# Patient Record
Sex: Female | Born: 1960
Health system: Southern US, Community
[De-identification: ages and names within clinical notes are randomized; demographics above are authoritative.]

## PROBLEM LIST (undated history)

## (undated) DIAGNOSIS — M1990 Unspecified osteoarthritis, unspecified site: Secondary | ICD-10-CM

## (undated) DIAGNOSIS — K219 Gastro-esophageal reflux disease without esophagitis: Secondary | ICD-10-CM

## (undated) DIAGNOSIS — J45909 Unspecified asthma, uncomplicated: Secondary | ICD-10-CM

## (undated) DIAGNOSIS — F411 Generalized anxiety disorder: Secondary | ICD-10-CM

## (undated) DIAGNOSIS — K589 Irritable bowel syndrome without diarrhea: Secondary | ICD-10-CM

## (undated) DIAGNOSIS — R569 Unspecified convulsions: Secondary | ICD-10-CM

## (undated) DIAGNOSIS — E119 Type 2 diabetes mellitus without complications: Secondary | ICD-10-CM

## (undated) HISTORY — DX: Unspecified convulsions: R56.9

## (undated) HISTORY — DX: Generalized anxiety disorder: F41.1

## (undated) HISTORY — DX: Gastro-esophageal reflux disease without esophagitis: K21.9

## (undated) HISTORY — DX: Irritable bowel syndrome without diarrhea: K58.9

## (undated) HISTORY — DX: Unspecified asthma, uncomplicated: J45.909

## (undated) HISTORY — DX: Type 2 diabetes mellitus without complications: E11.9

## (undated) HISTORY — PX: ABDOMINAL HYSTERECTOMY: SHX81

## (undated) HISTORY — PX: TUBAL LIGATION: SHX77

## (undated) HISTORY — DX: Unspecified osteoarthritis, unspecified site: M19.90

---

## 1997-11-10 ENCOUNTER — Other Ambulatory Visit: Admission: RE | Admit: 1997-11-10 | Discharge: 1997-11-10 | Payer: Self-pay | Admitting: Gynecology

## 1999-02-21 ENCOUNTER — Other Ambulatory Visit: Admission: RE | Admit: 1999-02-21 | Discharge: 1999-02-21 | Payer: Self-pay | Admitting: Gynecology

## 2000-02-27 ENCOUNTER — Other Ambulatory Visit: Admission: RE | Admit: 2000-02-27 | Discharge: 2000-02-27 | Payer: Self-pay | Admitting: Gynecology

## 2000-09-18 ENCOUNTER — Encounter: Payer: Self-pay | Admitting: Emergency Medicine

## 2000-09-18 ENCOUNTER — Emergency Department (HOSPITAL_COMMUNITY): Admission: EM | Admit: 2000-09-18 | Discharge: 2000-09-18 | Payer: Self-pay | Admitting: Emergency Medicine

## 2001-03-13 ENCOUNTER — Other Ambulatory Visit: Admission: RE | Admit: 2001-03-13 | Discharge: 2001-03-13 | Payer: Self-pay | Admitting: Gynecology

## 2001-12-17 ENCOUNTER — Encounter: Payer: Self-pay | Admitting: Gynecology

## 2001-12-18 ENCOUNTER — Encounter: Payer: Self-pay | Admitting: Gynecology

## 2001-12-24 ENCOUNTER — Inpatient Hospital Stay (HOSPITAL_COMMUNITY): Admission: RE | Admit: 2001-12-24 | Discharge: 2001-12-25 | Payer: Self-pay | Admitting: Gynecology

## 2001-12-24 ENCOUNTER — Encounter (INDEPENDENT_AMBULATORY_CARE_PROVIDER_SITE_OTHER): Payer: Self-pay | Admitting: Specialist

## 2004-11-07 ENCOUNTER — Ambulatory Visit: Payer: Self-pay | Admitting: Internal Medicine

## 2005-03-08 ENCOUNTER — Ambulatory Visit: Payer: Self-pay | Admitting: Internal Medicine

## 2005-05-07 ENCOUNTER — Ambulatory Visit: Payer: Self-pay | Admitting: Internal Medicine

## 2005-06-06 ENCOUNTER — Ambulatory Visit: Payer: Self-pay | Admitting: Family Medicine

## 2005-06-20 ENCOUNTER — Other Ambulatory Visit: Admission: RE | Admit: 2005-06-20 | Discharge: 2005-06-20 | Payer: Self-pay | Admitting: Gynecology

## 2005-07-05 ENCOUNTER — Encounter: Admission: RE | Admit: 2005-07-05 | Discharge: 2005-07-05 | Payer: Self-pay | Admitting: Gynecology

## 2005-12-03 ENCOUNTER — Emergency Department (HOSPITAL_COMMUNITY): Admission: EM | Admit: 2005-12-03 | Discharge: 2005-12-03 | Payer: Self-pay | Admitting: Emergency Medicine

## 2005-12-20 ENCOUNTER — Ambulatory Visit: Payer: Self-pay | Admitting: Internal Medicine

## 2007-02-03 ENCOUNTER — Emergency Department (HOSPITAL_COMMUNITY): Admission: EM | Admit: 2007-02-03 | Discharge: 2007-02-03 | Payer: Self-pay | Admitting: Family Medicine

## 2007-03-17 ENCOUNTER — Telehealth: Payer: Self-pay | Admitting: *Deleted

## 2007-03-17 DIAGNOSIS — M545 Low back pain, unspecified: Secondary | ICD-10-CM | POA: Insufficient documentation

## 2007-03-17 DIAGNOSIS — F411 Generalized anxiety disorder: Secondary | ICD-10-CM | POA: Insufficient documentation

## 2007-03-17 DIAGNOSIS — R51 Headache: Secondary | ICD-10-CM | POA: Insufficient documentation

## 2007-03-17 DIAGNOSIS — R519 Headache, unspecified: Secondary | ICD-10-CM | POA: Insufficient documentation

## 2007-03-17 HISTORY — DX: Generalized anxiety disorder: F41.1

## 2007-05-07 ENCOUNTER — Ambulatory Visit: Payer: Self-pay | Admitting: Internal Medicine

## 2007-05-07 DIAGNOSIS — J45901 Unspecified asthma with (acute) exacerbation: Secondary | ICD-10-CM | POA: Insufficient documentation

## 2007-06-16 ENCOUNTER — Telehealth (INDEPENDENT_AMBULATORY_CARE_PROVIDER_SITE_OTHER): Payer: Self-pay | Admitting: *Deleted

## 2007-06-25 ENCOUNTER — Telehealth: Payer: Self-pay | Admitting: *Deleted

## 2007-06-26 ENCOUNTER — Ambulatory Visit: Payer: Self-pay | Admitting: Critical Care Medicine

## 2007-06-26 DIAGNOSIS — J45909 Unspecified asthma, uncomplicated: Secondary | ICD-10-CM | POA: Insufficient documentation

## 2007-06-26 DIAGNOSIS — K219 Gastro-esophageal reflux disease without esophagitis: Secondary | ICD-10-CM

## 2007-06-26 HISTORY — DX: Unspecified asthma, uncomplicated: J45.909

## 2007-06-26 HISTORY — DX: Gastro-esophageal reflux disease without esophagitis: K21.9

## 2007-06-26 LAB — CONVERTED CEMR LAB: IgE (Immunoglobulin E), Serum: 77.9 intl units/mL (ref 0.0–180.0)

## 2007-06-30 ENCOUNTER — Telehealth (INDEPENDENT_AMBULATORY_CARE_PROVIDER_SITE_OTHER): Payer: Self-pay | Admitting: *Deleted

## 2007-07-02 ENCOUNTER — Encounter: Payer: Self-pay | Admitting: Critical Care Medicine

## 2007-07-02 ENCOUNTER — Ambulatory Visit: Payer: Self-pay | Admitting: Critical Care Medicine

## 2007-07-02 ENCOUNTER — Telehealth: Payer: Self-pay | Admitting: Critical Care Medicine

## 2007-08-08 ENCOUNTER — Encounter: Admission: RE | Admit: 2007-08-08 | Discharge: 2007-08-08 | Payer: Self-pay | Admitting: Gynecology

## 2007-08-15 ENCOUNTER — Ambulatory Visit: Payer: Self-pay | Admitting: Internal Medicine

## 2007-08-15 LAB — CONVERTED CEMR LAB
Albumin: 3.6 g/dL (ref 3.5–5.2)
Alkaline Phosphatase: 55 units/L (ref 39–117)
BUN: 3 mg/dL — ABNORMAL LOW (ref 6–23)
Basophils Relative: 0.9 % (ref 0.0–1.0)
Calcium: 9.4 mg/dL (ref 8.4–10.5)
Creatinine, Ser: 0.9 mg/dL (ref 0.4–1.2)
Direct LDL: 149.4 mg/dL
Eosinophils Relative: 3.6 % (ref 0.0–5.0)
GFR calc Af Amer: 87 mL/min
Glucose, Bld: 105 mg/dL — ABNORMAL HIGH (ref 70–99)
HCT: 37.9 % (ref 36.0–46.0)
Hemoglobin: 12.4 g/dL (ref 12.0–15.0)
MCV: 97.3 fL (ref 78.0–100.0)
Monocytes Absolute: 0.8 10*3/uL (ref 0.1–1.0)
Monocytes Relative: 10.2 % (ref 3.0–12.0)
Neutro Abs: 3.6 10*3/uL (ref 1.4–7.7)
Nitrite: NEGATIVE
Potassium: 4.4 meq/L (ref 3.5–5.1)
RBC: 3.9 M/uL (ref 3.87–5.11)
Specific Gravity, Urine: 1.025
Total CHOL/HDL Ratio: 5.7
Total Protein: 6.3 g/dL (ref 6.0–8.3)
Urobilinogen, UA: 0.2
WBC: 7.4 10*3/uL (ref 4.5–10.5)

## 2007-09-02 ENCOUNTER — Ambulatory Visit: Payer: Self-pay | Admitting: Internal Medicine

## 2007-09-18 ENCOUNTER — Telehealth: Payer: Self-pay | Admitting: *Deleted

## 2007-09-18 ENCOUNTER — Ambulatory Visit: Payer: Self-pay | Admitting: Internal Medicine

## 2007-09-18 LAB — CONVERTED CEMR LAB: OCCULT 1: NEGATIVE

## 2007-12-02 ENCOUNTER — Ambulatory Visit: Payer: Self-pay | Admitting: Internal Medicine

## 2008-07-06 ENCOUNTER — Telehealth: Payer: Self-pay | Admitting: Internal Medicine

## 2008-07-08 ENCOUNTER — Encounter: Payer: Self-pay | Admitting: Internal Medicine

## 2008-07-23 ENCOUNTER — Telehealth: Payer: Self-pay | Admitting: Internal Medicine

## 2009-02-17 LAB — CONVERTED CEMR LAB: Pap Smear: NORMAL

## 2009-03-30 ENCOUNTER — Ambulatory Visit: Payer: Self-pay | Admitting: Internal Medicine

## 2009-03-30 LAB — CONVERTED CEMR LAB
ALT: 18 units/L (ref 0–35)
Alkaline Phosphatase: 57 units/L (ref 39–117)
Basophils Relative: 0.3 % (ref 0.0–3.0)
Bilirubin Urine: NEGATIVE
Bilirubin, Direct: 0 mg/dL (ref 0.0–0.3)
Calcium: 9.2 mg/dL (ref 8.4–10.5)
Cholesterol: 218 mg/dL — ABNORMAL HIGH (ref 0–200)
Creatinine, Ser: 0.6 mg/dL (ref 0.4–1.2)
Eosinophils Relative: 1.5 % (ref 0.0–5.0)
Ketones, urine, test strip: NEGATIVE
Lymphocytes Relative: 38.2 % (ref 12.0–46.0)
Neutrophils Relative %: 52.1 % (ref 43.0–77.0)
RBC: 4.04 M/uL (ref 3.87–5.11)
Total CHOL/HDL Ratio: 6
Total Protein: 7.5 g/dL (ref 6.0–8.3)
Triglycerides: 110 mg/dL (ref 0.0–149.0)
Urobilinogen, UA: 0.2
VLDL: 22 mg/dL (ref 0.0–40.0)
WBC: 7.7 10*3/uL (ref 4.5–10.5)

## 2009-04-06 ENCOUNTER — Ambulatory Visit: Payer: Self-pay | Admitting: Internal Medicine

## 2009-04-06 DIAGNOSIS — E119 Type 2 diabetes mellitus without complications: Secondary | ICD-10-CM

## 2009-04-06 HISTORY — DX: Type 2 diabetes mellitus without complications: E11.9

## 2009-04-06 LAB — CONVERTED CEMR LAB
BUN: 4 mg/dL — ABNORMAL LOW (ref 6–23)
CO2: 27 meq/L (ref 19–32)
Chloride: 105 meq/L (ref 96–112)
Creatinine, Ser: 0.6 mg/dL (ref 0.4–1.2)

## 2009-04-08 ENCOUNTER — Telehealth: Payer: Self-pay | Admitting: Internal Medicine

## 2009-05-26 ENCOUNTER — Ambulatory Visit: Payer: Self-pay | Admitting: Internal Medicine

## 2009-05-26 LAB — CONVERTED CEMR LAB: Hgb A1c MFr Bld: 6.3 % (ref 4.6–6.5)

## 2009-06-03 ENCOUNTER — Ambulatory Visit: Payer: Self-pay | Admitting: Internal Medicine

## 2009-09-07 ENCOUNTER — Telehealth: Payer: Self-pay | Admitting: Internal Medicine

## 2009-09-15 ENCOUNTER — Ambulatory Visit: Payer: Self-pay | Admitting: Internal Medicine

## 2009-09-15 DIAGNOSIS — R42 Dizziness and giddiness: Secondary | ICD-10-CM | POA: Insufficient documentation

## 2009-09-15 DIAGNOSIS — R109 Unspecified abdominal pain: Secondary | ICD-10-CM | POA: Insufficient documentation

## 2009-09-15 LAB — CONVERTED CEMR LAB
Basophils Absolute: 0.1 10*3/uL (ref 0.0–0.1)
Basophils Relative: 0.8 % (ref 0.0–3.0)
Eosinophils Relative: 2.5 % (ref 0.0–5.0)
HCT: 37.9 % (ref 36.0–46.0)
Hemoglobin: 12.5 g/dL (ref 12.0–15.0)
Lymphs Abs: 3.8 10*3/uL (ref 0.7–4.0)
Monocytes Relative: 6.5 % (ref 3.0–12.0)
Neutro Abs: 5.3 10*3/uL (ref 1.4–7.7)
RBC: 3.93 M/uL (ref 3.87–5.11)
RDW: 12.8 % (ref 11.5–14.6)

## 2009-09-19 ENCOUNTER — Telehealth: Payer: Self-pay | Admitting: Internal Medicine

## 2009-11-25 ENCOUNTER — Ambulatory Visit: Payer: Self-pay | Admitting: Internal Medicine

## 2009-12-13 ENCOUNTER — Ambulatory Visit: Payer: Self-pay | Admitting: Internal Medicine

## 2009-12-13 DIAGNOSIS — M1990 Unspecified osteoarthritis, unspecified site: Secondary | ICD-10-CM

## 2009-12-13 DIAGNOSIS — M130 Polyarthritis, unspecified: Secondary | ICD-10-CM | POA: Insufficient documentation

## 2009-12-13 HISTORY — DX: Unspecified osteoarthritis, unspecified site: M19.90

## 2010-01-26 ENCOUNTER — Ambulatory Visit: Payer: Self-pay | Admitting: Family Medicine

## 2010-01-27 LAB — CONVERTED CEMR LAB
Basophils Relative: 0.4 % (ref 0.0–3.0)
Eosinophils Absolute: 0.2 10*3/uL (ref 0.0–0.7)
HCT: 37.9 % (ref 36.0–46.0)
Hemoglobin: 12.8 g/dL (ref 12.0–15.0)
Lymphs Abs: 3.6 10*3/uL (ref 0.7–4.0)
MCHC: 33.8 g/dL (ref 30.0–36.0)
MCV: 95.5 fL (ref 78.0–100.0)
Monocytes Absolute: 0.9 10*3/uL (ref 0.1–1.0)
Neutro Abs: 4.2 10*3/uL (ref 1.4–7.7)
RBC: 3.97 M/uL (ref 3.87–5.11)

## 2010-02-27 ENCOUNTER — Ambulatory Visit: Payer: Self-pay | Admitting: Internal Medicine

## 2010-02-27 DIAGNOSIS — K5909 Other constipation: Secondary | ICD-10-CM | POA: Insufficient documentation

## 2010-02-27 DIAGNOSIS — K589 Irritable bowel syndrome without diarrhea: Secondary | ICD-10-CM

## 2010-02-27 HISTORY — DX: Irritable bowel syndrome, unspecified: K58.9

## 2010-02-28 ENCOUNTER — Encounter: Payer: Self-pay | Admitting: Internal Medicine

## 2010-04-10 ENCOUNTER — Ambulatory Visit: Payer: Self-pay | Admitting: Internal Medicine

## 2010-04-17 ENCOUNTER — Telehealth: Payer: Self-pay | Admitting: Internal Medicine

## 2010-04-17 DIAGNOSIS — R569 Unspecified convulsions: Secondary | ICD-10-CM | POA: Insufficient documentation

## 2010-04-17 HISTORY — DX: Unspecified convulsions: R56.9

## 2010-04-19 ENCOUNTER — Encounter: Payer: Self-pay | Admitting: Internal Medicine

## 2010-06-05 ENCOUNTER — Ambulatory Visit
Admission: RE | Admit: 2010-06-05 | Discharge: 2010-06-05 | Payer: Self-pay | Source: Home / Self Care | Attending: Internal Medicine | Admitting: Internal Medicine

## 2010-06-05 ENCOUNTER — Encounter: Payer: Self-pay | Admitting: *Deleted

## 2010-06-05 ENCOUNTER — Other Ambulatory Visit: Payer: Self-pay | Admitting: Internal Medicine

## 2010-06-05 NOTE — Progress Notes (Signed)
Subjective:     Patient ID: Denise Beard is a 50 y.o. female.  HPI Comments: IBS is GI issue  Diabetes She presents for her follow-up diabetic visit. She has type 2 diabetes mellitus. No MedicAlert identification noted. The initial diagnosis of diabetes was made 1 year ago. Her disease course has been stable. Hypoglycemia symptoms include headaches, hunger, sweats and tremors. Associated symptoms include blurred vision. Pertinent negatives for diabetes include no chest pain, no fatigue, no foot paresthesias, no foot ulcerations, no polydipsia, no polyuria and no weight loss. There are no hypoglycemic complications. Symptoms are stable. There are no diabetic complications. Risk factors for coronary artery disease include diabetes mellitus and tobacco exposure. Current diabetic treatment includes oral agent (monotherapy). She is compliant with treatment most of the time. Her weight is stable. She is following a generally healthy diet. Meal planning includes avoidance of concentrated sweets. She has not had a previous visit with a dietician. She rarely participates in exercise. There is no change in her home blood glucose trend. An ACE inhibitor/angiotensin II receptor blocker is not being taken. She does not see a podiatrist.Eye exam is not current.  Asthma She complains of hoarse voice. This is a recurrent problem. Associated symptoms include headaches and sweats. Pertinent negatives include no chest pain or weight loss. Her symptoms are aggravated by change in weather. Her symptoms are alleviated by beta-agonist and steroid inhaler. She reports significant improvement on treatment. Risk factors for lung disease include no known risk factors. Her past medical history is significant for asthma.  GI Problem The primary symptoms include abdominal pain and diarrhea. Primary symptoms do not include weight loss or fatigue. The problem has not changed since onset. The abdominal pain is located in the LUQ. The  severity of the abdominal pain is 5/10.      Review of Systems  Constitutional: Negative.  Negative for weight loss and fatigue.  HENT: Positive for hoarse voice.   Eyes: Positive for blurred vision.  Respiratory: Negative.   Cardiovascular: Negative.  Negative for chest pain.  Gastrointestinal: Positive for abdominal pain and diarrhea.  Genitourinary: Negative.  Negative for polyuria.  Neurological: Positive for tremors and headaches.  Hematological: Negative for polydipsia.       Objective:   Physical Exam  Constitutional: She is oriented to person, place, and time. She appears well-developed and well-nourished.  HENT:  Head: Normocephalic and atraumatic.  Eyes: Pupils are equal, round, and reactive to light.  Neck: No thyromegaly present.  Abdominal: There is tenderness in the left lower quadrant. There is no rebound.         Tender in the left lower quadrate without rebound  Neurological: She is alert and oriented to person, place, and time.  Skin: Skin is warm and dry.       Assessment:     The pt has stable DM and Asthma and we will order an A1C and Bmet  I have given the pt a glucometer and taught the use of the meter to measure randomblood glucoses and return next visit with meter and reading. I have discussed referral to dietician and a referral has been made The Pt IBS is still poorly controlled and diet is the main factor with need for increased fiber and less carbs.

## 2010-06-06 LAB — BASIC METABOLIC PANEL
BUN: 9 mg/dL (ref 6–23)
CO2: 29 mEq/L (ref 19–32)
Calcium: 10 mg/dL (ref 8.4–10.5)
Chloride: 105 mEq/L (ref 96–112)
Creatinine, Ser: 0.7 mg/dL (ref 0.4–1.2)
GFR: 125.1 mL/min (ref >60.00)
Glucose, Bld: 110 mg/dL — ABNORMAL HIGH (ref 70–99)
Potassium: 5.1 mEq/L (ref 3.5–5.1)
Sodium: 141 mEq/L (ref 135–145)

## 2010-06-06 LAB — HEMOGLOBIN A1C: Hgb A1c MFr Bld: 6.2 % (ref 4.6–6.5)

## 2010-06-22 NOTE — Medication Information (Signed)
Summary: Amitiza Capsule Approved  Amitiza Capsule Approved   Imported By: Maryln Gottron 03/02/2010 15:24:54  _____________________________________________________________________  External Attachment:    Type:   Image     Comment:   External Document

## 2010-06-22 NOTE — Assessment & Plan Note (Signed)
Summary: 6-7 WK ROV//SLM   Vital Signs:  Patient profile:   50 year old female Height:      66 inches Weight:      163 pounds BMI:     26.40 Temp:     98.2 degrees F oral Pulse rate:   72 / minute Resp:     14 per minute BP sitting:   110 / 70  (left arm)  Vitals Entered By: Willy Eddy, LPN (April 10, 2010 4:39 PM) CC: roa- Is Patient Diabetic? Yes   Primary Care Provider:  Stacie Glaze MD  CC:  roa-.  History of Present Illness: the patient  takes the amitiza in the evening.. when she took in the am she had loose stools in the evening she still has the increased gas and the full feeling in the colon... she feels the need to push she is on the 8 mg at this point DM is controlled with CBG's in range  Preventive Screening-Counseling & Management  Alcohol-Tobacco     Smoking Status: current     Packs/Day: 0.25     Year Started: 1984     Year Quit: 2007     Tobacco Counseling: to quit use of tobacco products  Problems Prior to Update: 1)  Seizure Disorder  (ICD-780.39) 2)  Irritable Bowel Syndrome  (ICD-564.1) 3)  Constipation, Chronic  (ICD-564.09) 4)  Unspec Polyarthropathy/polyarthrit Mx Sites  (ICD-716.59) 5)  Flank Pain, Left  (ICD-789.09) 6)  Postural Lightheadedness  (ICD-780.4) 7)  Diab w/o Comp Type Ii/uns Not Stated Uncntrl  (ICD-250.00) 8)  Routine General Medical Exam@health  Care Facl  (ICD-V70.0) 9)  Gerd  (ICD-530.81) 10)  Asthma  (ICD-493.90) 11)  Asthma Unspecified With Exacerbation  (ICD-493.92) 12)  Family History Depression  (ICD-V17.0) 13)  Low Back Pain  (ICD-724.2) 14)  Headache  (ICD-784.0) 15)  Anxiety  (ICD-300.00)  Current Problems (verified): 1)  Irritable Bowel Syndrome  (ICD-564.1) 2)  Constipation, Chronic  (ICD-564.09) 3)  Unspec Polyarthropathy/polyarthrit Mx Sites  (ICD-716.59) 4)  Flank Pain, Left  (ICD-789.09) 5)  Postural Lightheadedness  (ICD-780.4) 6)  Diab w/o Comp Type Ii/uns Not Stated Uncntrl   (ICD-250.00) 7)  Routine General Medical Exam@health  Care Facl  (ICD-V70.0) 8)  Gerd  (ICD-530.81) 9)  Asthma  (ICD-493.90) 10)  Asthma Unspecified With Exacerbation  (ICD-493.92) 11)  Family History Depression  (ICD-V17.0) 12)  Low Back Pain  (ICD-724.2) 13)  Headache  (ICD-784.0) 14)  Anxiety  (ICD-300.00)  Medications Prior to Update: 1)  Symbicort 80-4.5 Mcg/act Aero (Budesonide-Formoterol Fumarate) .... One Puff Two Times A Day 2)  Ambien 10 Mg  Tabs (Zolpidem Tartrate) .... At Bedtime As Needed 3)  Omeprazole 20 Mg  Cpdr (Omeprazole) .... By Mouth Daily. Take One Half Hour Before Eating. As Needed 4)  Januvia 100 Mg Tabs (Sitagliptin Phosphate) .... One By Mouth Daily 5)  Fexofenadine Hcl 180 Mg Tabs (Fexofenadine Hcl) .... One By Mouth Daily 6)  Vimovo 500-20 Mg Tbec (Naproxen-Esomeprazole) .... One By Mouth Bid 7)  Amitiza 8 Mcg Caps (Lubiprostone) .... One By  Two Times A Day  Current Medications (verified): 1)  Symbicort 80-4.5 Mcg/act Aero (Budesonide-Formoterol Fumarate) .... One Puff Two Times A Day 2)  Ambien 10 Mg  Tabs (Zolpidem Tartrate) .... At Bedtime As Needed 3)  Omeprazole 20 Mg  Cpdr (Omeprazole) .... By Mouth Daily. Take One Half Hour Before Eating. As Needed 4)  Januvia 100 Mg Tabs (Sitagliptin Phosphate) .... One By Mouth Daily  5)  Fexofenadine Hcl 180 Mg Tabs (Fexofenadine Hcl) .... One By Mouth Daily 6)  Vimovo 500-20 Mg Tbec (Naproxen-Esomeprazole) .... One By Mouth Bid 7)  Amitiza 8 Mcg Caps (Lubiprostone) .... One By  Two Times A Day  Allergies (verified): 1)  ! Iodine  Past History:  Family History: Last updated: 09/02/2007 Family History Depression Colon Cancer  hx of colon cancer in uncle MI/Heart Attack  Social History: Last updated: 03/17/2007 Occupation: Single Current Smoker  Risk Factors: Smoking Status: current (04/10/2010) Packs/Day: 0.25 (04/10/2010)  Past medical, surgical, family and social histories (including risk factors)  reviewed, and no changes noted (except as noted below).  Past Medical History: Reviewed history from 06/26/2007 and no changes required. Iron Defiency Insomnia Anxiety Headache Low back pain Asthma  Past Surgical History: Reviewed history from 03/17/2007 and no changes required. Denies surgical history  Family History: Reviewed history from 09/02/2007 and no changes required. Family History Depression Colon Cancer  hx of colon cancer in uncle MI/Heart Attack  Social History: Reviewed history from 03/17/2007 and no changes required. Occupation: Single Current Smoker  Review of Systems  The patient denies anorexia, fever, weight loss, weight gain, vision loss, decreased hearing, hoarseness, chest pain, syncope, dyspnea on exertion, peripheral edema, prolonged cough, headaches, hemoptysis, abdominal pain, melena, hematochezia, severe indigestion/heartburn, hematuria, incontinence, genital sores, muscle weakness, suspicious skin lesions, transient blindness, difficulty walking, depression, unusual weight change, abnormal bleeding, enlarged lymph nodes, angioedema, and breast masses.    Physical Exam  General:  Well-developed,well-nourished,in no acute distress; alert,appropriate and cooperative throughout examination Head:  Normocephalic and atraumatic without obvious abnormalities. No apparent alopecia or balding. Eyes:  pupils equal and pupils round.   Ears:  R ear normal and L ear normal.   Nose:  no external deformity and no nasal discharge.   Neck:  No deformities, masses, or tenderness noted. Lungs:  Normal respiratory effort, chest expands symmetrically. Lungs are clear to auscultation, no crackles or wheezes. Heart:  normal rate and regular rhythm.   Abdomen:  bowel sounds hypoactive and epigastric tenderness.   Msk:  normal ROM and no joint swelling.   Neurologic:  alert & oriented X3 and gait normal.   Skin:  Intact without suspicious lesions or rashes Psych:   Cognition and judgment appear intact. Alert and cooperative with normal attention span and concentration. No apparent delusions, illusions, hallucinations   Impression & Recommendations:  Problem # 1:  IRRITABLE BOWEL SYNDROME (ICD-564.1)  Problem # 2:  CONSTIPATION, CHRONIC (ICD-564.09)  Discussed dietary fiber measures and increased water intake.   Problem # 3:  DIAB W/O COMP TYPE II/UNS NOT STATED UNCNTRL (ICD-250.00)  Her updated medication list for this problem includes:    Januvia 100 Mg Tabs (Sitagliptin phosphate) ..... One by mouth daily  Labs Reviewed: Creat: 0.6 (04/06/2009)    Reviewed HgBA1c results: 5.8 (11/25/2009)  6.3 (05/26/2009)  Complete Medication List: 1)  Symbicort 80-4.5 Mcg/act Aero (Budesonide-formoterol fumarate) .... One puff two times a day 2)  Ambien 10 Mg Tabs (Zolpidem tartrate) .... At bedtime as needed 3)  Omeprazole 20 Mg Cpdr (Omeprazole) .... By mouth daily. take one half hour before eating. as needed 4)  Januvia 100 Mg Tabs (Sitagliptin phosphate) .... One by mouth daily 5)  Fexofenadine Hcl 180 Mg Tabs (Fexofenadine hcl) .... One by mouth daily 6)  Vimovo 500-20 Mg Tbec (Naproxen-esomeprazole) .... One by mouth bid 7)  Amitiza 8 Mcg Caps (Lubiprostone) .... One by  two times a day  Patient  Instructions: 1)  increase the dose to two 8 mg amitiza at bed time 2)  Please schedule a follow-up appointment in 6 weeks. Prescriptions: OMEPRAZOLE 20 MG  CPDR (OMEPRAZOLE) By mouth daily. Take one half hour before eating. as needed  #30 x 6   Entered by:   Willy Eddy, LPN   Authorized by:   Stacie Glaze MD   Signed by:   Willy Eddy, LPN on 56/21/3086   Method used:   Print then Give to Patient   RxID:   [id number] AMBIEN 10 MG  TABS (ZOLPIDEM TARTRATE) at bedtime as needed  #30 x 5   Entered by:   Willy Eddy, LPN   Authorized by:   Stacie Glaze MD   Signed by:   Willy Eddy, LPN on 44/05/270   Method used:    Print then Give to Patient   RxID:   [id number]    Orders Added: 1)  Est. Patient Level IV [38756]

## 2010-06-22 NOTE — Progress Notes (Signed)
  Phone Note Call from Patient   Summary of Call: request labs results- pt told all wnl  Initial call taken by: Willy Eddy, LPN,  Sep 20, 3014 11:15 AM

## 2010-06-22 NOTE — Assessment & Plan Note (Signed)
Summary: dizzy eopisodes/bmw   Vital Signs:  Patient profile:   50 year old female Height:      66 inches Weight:      170 pounds BMI:     27.54 Temp:     162 degrees F oral Pulse rate:   72 / minute Resp:     14 per minute BP sitting:   106 / 70  (left arm)  Vitals Entered By: Willy Eddy, LPN (September 15, 2009 3:33 PM) CC: c/o dizzy epoisodes and dull ache in left back- states she has the same feeling she had when Dr. Lovell Sheehan told her shwas predisposed to dm   CC:  c/o dizzy epoisodes and dull ache in left back- states she has the same feeling she had when Dr. Lovell Sheehan told her shwas predisposed to dm.  History of Present Illness: several weeks ago had dizzyness and noted quesdtional incontinance and now has intermitnat dizzyness that occurs for a day at a time and the symptoms have been occuring all day today she is concerned about infection the dizzyness is possitional of the head with laterla motion being perdominant  Preventive Screening-Counseling & Management  Alcohol-Tobacco     Smoking Status: current     Packs/Day: 0.25     Year Started: 1984     Year Quit: 2007  Problems Prior to Update: 1)  Diab w/o Comp Type Ii/uns Not Stated Uncntrl  (ICD-250.00) 2)  Acute Maxillary Sinusitis  (ICD-461.0) 3)  Routine General Medical Exam@health  Care Facl  (ICD-V70.0) 4)  Gerd  (ICD-530.81) 5)  Asthma  (ICD-493.90) 6)  Asthma Unspecified With Exacerbation  (ICD-493.92) 7)  Family History Depression  (ICD-V17.0) 8)  Low Back Pain  (ICD-724.2) 9)  Headache  (ICD-784.0) 10)  Anxiety  (ICD-300.00)  Current Problems (verified): 1)  Diab w/o Comp Type Ii/uns Not Stated Uncntrl  (ICD-250.00) 2)  Acute Maxillary Sinusitis  (ICD-461.0) 3)  Routine General Medical Exam@health  Care Facl  (ICD-V70.0) 4)  Gerd  (ICD-530.81) 5)  Asthma  (ICD-493.90) 6)  Asthma Unspecified With Exacerbation  (ICD-493.92) 7)  Family History Depression  (ICD-V17.0) 8)  Low Back Pain   (ICD-724.2) 9)  Headache  (ICD-784.0) 10)  Anxiety  (ICD-300.00)  Medications Prior to Update: 1)  Symbicort 80-4.5 Mcg/act Aero (Budesonide-Formoterol Fumarate) .... One Puff Two Times A Day 2)  Ambien 10 Mg  Tabs (Zolpidem Tartrate) .... At Bedtime As Needed 3)  Omeprazole 20 Mg  Cpdr (Omeprazole) .... By Mouth Daily. Take One Half Hour Before Eating. As Needed 4)  Januvia 100 Mg Tabs (Sitagliptin Phosphate) .... One By Mouth Daily  Current Medications (verified): 1)  Symbicort 80-4.5 Mcg/act Aero (Budesonide-Formoterol Fumarate) .... One Puff Two Times A Day 2)  Ambien 10 Mg  Tabs (Zolpidem Tartrate) .... At Bedtime As Needed 3)  Omeprazole 20 Mg  Cpdr (Omeprazole) .... By Mouth Daily. Take One Half Hour Before Eating. As Needed 4)  Januvia 100 Mg Tabs (Sitagliptin Phosphate) .... One By Mouth Daily 5)  Fexofenadine Hcl 180 Mg Tabs (Fexofenadine Hcl) .... One By Mouth Daily  Allergies (verified): 1)  ! Iodine  Past History:  Family History: Last updated: 09/02/2007 Family History Depression Colon Cancer  hx of colon cancer in uncle MI/Heart Attack  Social History: Last updated: 03/17/2007 Occupation: Single Current Smoker  Risk Factors: Smoking Status: current (09/15/2009) Packs/Day: 0.25 (09/15/2009)  Past medical, surgical, family and social histories (including risk factors) reviewed, and no changes noted (except as noted below).  Past  Medical History: Reviewed history from 06/26/2007 and no changes required. Iron Defiency Insomnia Anxiety Headache Low back pain Asthma  Past Surgical History: Reviewed history from 03/17/2007 and no changes required. Denies surgical history  Family History: Reviewed history from 09/02/2007 and no changes required. Family History Depression Colon Cancer  hx of colon cancer in uncle MI/Heart Attack  Social History: Reviewed history from 03/17/2007 and no changes required. Occupation: Single Current Smoker  Review of  Systems  The patient denies anorexia, fever, weight loss, weight gain, vision loss, decreased hearing, hoarseness, chest pain, syncope, dyspnea on exertion, peripheral edema, prolonged cough, headaches, hemoptysis, abdominal pain, melena, hematochezia, severe indigestion/heartburn, hematuria, incontinence, genital sores, muscle weakness, suspicious skin lesions, transient blindness, difficulty walking, depression, unusual weight change, abnormal bleeding, enlarged lymph nodes, angioedema, and breast masses.    Physical Exam  General:  Well-developed,well-nourished,in no acute distress; alert,appropriate and cooperative throughout examination Head:  Normocephalic and atraumatic without obvious abnormalities. No apparent alopecia or balding. Eyes:  pupils equal and pupils round.   Ears:  R ear normal and L ear normal.   Nose:  no external deformity and no nasal discharge.   Mouth:  Oral mucosa and oropharynx without lesions or exudates.  Teeth in good repair.pharyngeal exudate, posterior lymphoid hypertrophy, and postnasal drip.   Neck:  No deformities, masses, or tenderness noted. Lungs:  no wheezes.   Heart:  normal rate and regular rhythm.   Abdomen:  soft, non-tender, and normal bowel sounds.   Msk:  normal ROM and no joint swelling.   Extremities:  No clubbing, cyanosis, edema, or deformity noted with normal full range of motion of all joints.   Neurologic:  alert & oriented X3 and gait normal.     Impression & Recommendations:  Problem # 1:  POSTURAL LIGHTHEADEDNESS (ICD-780.4)  I suspect vertigo or inner ear etiology  Demonstrated maneuvers to self-treat vertigo. Patient to call to be seen if no improvement in 10-14 days, sooner if worse.   Her updated medication list for this problem includes:    Fexofenadine Hcl 180 Mg Tabs (Fexofenadine hcl) ..... One by mouth daily  Orders: Venipuncture (16109) TLB-CBC Platelet - w/Differential (85025-CBCD)  Problem # 2:  FLANK PAIN, LEFT  (ICD-789.09)  Discussed use of medications, application of heat or cold, and exercises.   Orders: UA Dipstick w/o Micro (automated)  (81003)  Complete Medication List: 1)  Symbicort 80-4.5 Mcg/act Aero (Budesonide-formoterol fumarate) .... One puff two times a day 2)  Ambien 10 Mg Tabs (Zolpidem tartrate) .... At bedtime as needed 3)  Omeprazole 20 Mg Cpdr (Omeprazole) .... By mouth daily. take one half hour before eating. as needed 4)  Januvia 100 Mg Tabs (Sitagliptin phosphate) .... One by mouth daily 5)  Fexofenadine Hcl 180 Mg Tabs (Fexofenadine hcl) .... One by mouth daily  Patient Instructions: 1)  keep appointment Prescriptions: FEXOFENADINE HCL 180 MG TABS (FEXOFENADINE HCL) one by mouth daily  #30 x 1   Entered and Authorized by:   Stacie Glaze MD   Signed by:   Stacie Glaze MD on 09/15/2009   Method used:   Electronically to        [facility] [phone]* (retail)       [address]       [place], [place]  [address]       Ph: [phone] or [phone]       Fax: [phone]   RxID:  [id number]   Appended Document: Orders Update    Clinical Lists Changes  Observations: Added new observation of COMMENTS: Wynona Canes, CMA  September 15, 2009 4:30 PM  (09/15/2009 16:27) Added new observation of PH URINE: 5.0  (09/15/2009 16:27) Added new observation of SPEC GR URIN: 1.020  (09/15/2009 16:27) Added new observation of WBC DIPSTK U: negative  (09/15/2009 16:27) Added new observation of NITRITE URN: negative  (09/15/2009 16:27) Added new observation of UROBILINOGEN: 0.2  (09/15/2009 16:27) Added new observation of PROTEIN, URN: negative  (09/15/2009 16:27) Added new observation of BLOOD UR DIP: negative  (09/15/2009 16:27) Added new observation of KETONES URN: negative  (09/15/2009 16:27) Added new observation of BILIRUBIN UR: negative  (09/15/2009 16:27) Added new observation of GLUCOSE, URN: negative  (09/15/2009  16:27) Added new observation of APPEARANCE U: Clear  (09/15/2009 16:27) Added new observation of UA COLOR: yellow  (09/15/2009 16:27)      Laboratory Results   Urine Tests  Date/Time Recieved: September 15, 2009 4:30 PM  Date/Time Reported: September 15, 2009 4:30 PM  Routine Urinalysis   Color: yellow Appearance: Clear Glucose: negative   (Normal Range: Negative) Bilirubin: negative   (Normal Range: Negative) Ketone: negative   (Normal Range: Negative) Spec. Gravity: 1.020   (Normal Range: 1.003-1.035) Blood: negative   (Normal Range: Negative) pH: 5.0   (Normal Range: 5.0-8.0) Protein: negative   (Normal Range: Negative) Urobilinogen: 0.2   (Normal Range: 0-1) Nitrite: negative   (Normal Range: Negative) Leukocyte Esterace: negative   (Normal Range: Negative)    Comments: Wynona Canes, CMA  September 15, 2009 4:30 PM

## 2010-06-22 NOTE — Progress Notes (Signed)
Summary: Pt having dizziness and blurred vision again. ?kidney issues  Phone Note Call from Patient Call back at Work Phone [phone] Call back at ext [phone]  or call cell [phone]   Caller: Patient Reason for Call: Acute Illness Summary of Call: Pt called and said that the dizziness and blurred vision has come back. Pt also says some blood sugar readings have been spiked. Pt feels like there is something wrong with her kidneys. Pt would like a call back asap, or would like and ov to see Dr. Lovell Sheehan next week.  Initial call taken by: Lucy Antigua,  September 07, 2009 2:42 PM  Follow-up for Phone Call        pt informed - appointment given for next thursday 4-28 at 12.:30 Follow-up by: Willy Eddy, LPN,  September 07, 2009 3:58 PM

## 2010-06-22 NOTE — Letter (Signed)
Summary: Out of Work  [facility] [employer]  [address]   [place], [place] [address]   Phone: [phone]  Fax: [phone]    December 13, 2009   Employee:  Denise Beard    To Whom It May Concern:   For Medical reasons, please excuse the above named employee from work for the following dates:  Start:   December 08, 2009  End:   july 27,2011  If you need additional information, please feel free to contact our office.         Sincerely,    Stacie Glaze MD

## 2010-06-22 NOTE — Assessment & Plan Note (Signed)
Summary: 3 month rov/njr Denise Beard BMP/NJR   Vital Signs:  Patient profile:   50 year old female Height:      66 inches Weight:      158 pounds BMI:     25.59 Temp:     98.2 degrees F oral Pulse rate:   68 / minute Resp:     14 per minute BP sitting:   110 / 70  (left arm)  Vitals Entered By: Willy Eddy, LPN (February 27, 2010 11:32 AM) CC: roa- labs from last ov Is Patient Diabetic? Yes Did you bring your meter with you today? No   Primary Care Provider:  Stacie Glaze MD  CC:  roa- labs from last ov.  History of Present Illness: the pt had blood work for moderate constipation and sensation to push and pain and bloating in the colon when she feels this she goes to the toilet and pushes without relief the CBC and the TSH were normal she has been taking MOM for the constipation she uses laxitive and not fiber her blood sugars have been higher head ache pattern stable  Preventive Screening-Counseling & Management  Alcohol-Tobacco     Smoking Status: current     Packs/Day: 0.25     Year Started: 1984     Year Quit: 2007     Tobacco Counseling: to quit use of tobacco products  Problems Prior to Update: 1)  Constipation, Chronic  (ICD-564.09) 2)  Unspec Polyarthropathy/polyarthrit Mx Sites  (ICD-716.59) 3)  Flank Pain, Left  (ICD-789.09) 4)  Postural Lightheadedness  (ICD-780.4) 5)  Diab w/o Comp Type Ii/uns Not Stated Uncntrl  (ICD-250.00) 6)  Acute Maxillary Sinusitis  (ICD-461.0) 7)  Routine General Medical Exam@health  Care Facl  (ICD-V70.0) 8)  Gerd  (ICD-530.81) 9)  Asthma  (ICD-493.90) 10)  Asthma Unspecified With Exacerbation  (ICD-493.92) 11)  Family History Depression  (ICD-V17.0) 12)  Low Back Pain  (ICD-724.2) 13)  Headache  (ICD-784.0) 14)  Anxiety  (ICD-300.00)  Current Problems (verified): 1)  Unspec Polyarthropathy/polyarthrit Mx Sites  (ICD-716.59) 2)  Flank Pain, Left  (ICD-789.09) 3)  Postural Lightheadedness  (ICD-780.4) 4)  Diab w/o Comp  Type Ii/uns Not Stated Uncntrl  (ICD-250.00) 5)  Acute Maxillary Sinusitis  (ICD-461.0) 6)  Routine General Medical Exam@health  Care Facl  (ICD-V70.0) 7)  Gerd  (ICD-530.81) 8)  Asthma  (ICD-493.90) 9)  Asthma Unspecified With Exacerbation  (ICD-493.92) 10)  Family History Depression  (ICD-V17.0) 11)  Low Back Pain  (ICD-724.2) 12)  Headache  (ICD-784.0) 13)  Anxiety  (ICD-300.00)  Medications Prior to Update: 1)  Symbicort 80-4.5 Mcg/act Aero (Budesonide-Formoterol Fumarate) .... One Puff Two Times A Day 2)  Ambien 10 Mg  Tabs (Zolpidem Tartrate) .... At Bedtime As Needed 3)  Omeprazole 20 Mg  Cpdr (Omeprazole) .... By Mouth Daily. Take One Half Hour Before Eating. As Needed 4)  Januvia 100 Mg Tabs (Sitagliptin Phosphate) .... One By Mouth Daily 5)  Fexofenadine Hcl 180 Mg Tabs (Fexofenadine Hcl) .... One By Mouth Daily 6)  Vimovo 500-20 Mg Tbec (Naproxen-Esomeprazole) .... One By Mouth Bid  Current Medications (verified): 1)  Symbicort 80-4.5 Mcg/act Aero (Budesonide-Formoterol Fumarate) .... One Puff Two Times A Day 2)  Ambien 10 Mg  Tabs (Zolpidem Tartrate) .... At Bedtime As Needed 3)  Omeprazole 20 Mg  Cpdr (Omeprazole) .... By Mouth Daily. Take One Half Hour Before Eating. As Needed 4)  Januvia 100 Mg Tabs (Sitagliptin Phosphate) .... One By Mouth Daily 5)  Fexofenadine Hcl 180 Mg Tabs (Fexofenadine Hcl) .... One By Mouth Daily 6)  Vimovo 500-20 Mg Tbec (Naproxen-Esomeprazole) .... One By Mouth Bid 7)  Amitiza 8 Mcg Caps (Lubiprostone) .... One By  Two Times A Day  Allergies (verified): 1)  ! Iodine  Past History:  Family History: Last updated: 09/02/2007 Family History Depression Colon Cancer  hx of colon cancer in uncle MI/Heart Attack  Social History: Last updated: 03/17/2007 Occupation: Single Current Smoker  Risk Factors: Smoking Status: current (02/27/2010) Packs/Day: 0.25 (02/27/2010)  Past medical, surgical, family and social histories (including risk  factors) reviewed, and no changes noted (except as noted below).  Past Medical History: Reviewed history from 06/26/2007 and no changes required. Iron Defiency Insomnia Anxiety Headache Low back pain Asthma  Past Surgical History: Reviewed history from 03/17/2007 and no changes required. Denies surgical history  Family History: Reviewed history from 09/02/2007 and no changes required. Family History Depression Colon Cancer  hx of colon cancer in uncle MI/Heart Attack  Social History: Reviewed history from 03/17/2007 and no changes required. Occupation: Single Current Smoker  Review of Systems  The patient denies anorexia, fever, weight loss, weight gain, vision loss, decreased hearing, hoarseness, chest pain, syncope, dyspnea on exertion, peripheral edema, prolonged cough, headaches, hemoptysis, abdominal pain, melena, hematochezia, severe indigestion/heartburn, hematuria, incontinence, genital sores, muscle weakness, suspicious skin lesions, transient blindness, difficulty walking, depression, unusual weight change, abnormal bleeding, enlarged lymph nodes, angioedema, and breast masses.    Physical Exam  General:  Well-developed,well-nourished,in no acute distress; alert,appropriate and cooperative throughout examination Head:  Normocephalic and atraumatic without obvious abnormalities. No apparent alopecia or balding. Eyes:  pupils equal and pupils round.   Ears:  R ear normal and L ear normal.   Nose:  no external deformity and no nasal discharge.   Mouth:  Oral mucosa and oropharynx without lesions or exudates.  Teeth in good repair. Neck:  No deformities, masses, or tenderness noted. Lungs:  Normal respiratory effort, chest expands symmetrically. Lungs are clear to auscultation, no crackles or wheezes. Heart:  normal rate and regular rhythm.   Abdomen:  bowel sounds hypoactive and epigastric tenderness.     Impression & Recommendations:  Problem # 1:  CONSTIPATION,  CHRONIC (ICD-564.09) Assessment Deteriorated amitiza 8 mg trial mobility and for increased gas Discussed dietary fiber measures and increased water intake.  this could be early gastroparesis  Problem # 2:  DIAB W/O COMP TYPE II/UNS NOT STATED UNCNTRL (ICD-250.00) Assessment: Deteriorated fair control, recent elevatiosn due t diet issues she hopes to have resolved Her updated medication list for this problem includes:    Januvia 100 Mg Tabs (Sitagliptin phosphate) ..... One by mouth daily  Labs Reviewed: Creat: 0.6 (04/06/2009)    Reviewed HgBA1c results: 5.8 (11/25/2009)  6.3 (05/26/2009)  Problem # 3:  HEADACHE (ICD-784.0) Assessment: Unchanged  Her updated medication list for this problem includes:    Vimovo 500-20 Mg Tbec (Naproxen-esomeprazole) ..... One by mouth bid  Headache diary reviewed.  Complete Medication List: 1)  Symbicort 80-4.5 Mcg/act Aero (Budesonide-formoterol fumarate) .... One puff two times a day 2)  Ambien 10 Mg Tabs (Zolpidem tartrate) .... At bedtime as needed 3)  Omeprazole 20 Mg Cpdr (Omeprazole) .... By mouth daily. take one half hour before eating. as needed 4)  Januvia 100 Mg Tabs (Sitagliptin phosphate) .... One by mouth daily 5)  Fexofenadine Hcl 180 Mg Tabs (Fexofenadine hcl) .... One by mouth daily 6)  Vimovo 500-20 Mg Tbec (Naproxen-esomeprazole) .... One by mouth  bid 7)  Amitiza 8 Mcg Caps (Lubiprostone) .... One by  two times a day  Patient Instructions: 1)  Please schedule a follow-up appointment in 6-7 weeks Prescriptions: AMITIZA 8 MCG CAPS (LUBIPROSTONE) one by  two times a day  #60 x 6   Entered and Authorized by:   Stacie Glaze MD   Signed by:   Stacie Glaze MD on 02/27/2010   Method used:   [facility]        [facility] [address] [phone]* (retail)       [address]       [place], [place]  [address]       Ph: [phone] or [phone]       Fax: [phone]   RxID:   [id number]

## 2010-06-22 NOTE — Assessment & Plan Note (Signed)
Summary: 2 month fup//ccm   Vital Signs:  Patient profile:   50 year old female Height:      66 inches Weight:      170 pounds BMI:     27.54 Temp:     98.2 degrees F oral Pulse rate:   72 / minute Resp:     14 per minute BP sitting:   130 / 80  (left arm)  Vitals Entered By: Willy Eddy, LPN (June 03, 2009 2:57 PM) CC: roa labs   CC:  roa labs.  Preventive Screening-Counseling & Management  Alcohol-Tobacco     Smoking Status: current     Packs/Day: 0.25     Year Started: 1984     Year Quit: 2007  Allergies: 1)  ! Iodine  Past History:  Family History: Last updated: 09/02/2007 Family History Depression Colon Cancer  hx of colon cancer in uncle MI/Heart Attack  Social History: Last updated: 03/17/2007 Occupation: Single Current Smoker  Risk Factors: Smoking Status: current (06/03/2009) Packs/Day: 0.25 (06/03/2009)  Past medical, surgical, family and social histories (including risk factors) reviewed, and no changes noted (except as noted below).  Past Medical History: Reviewed history from 06/26/2007 and no changes required. Iron Defiency Insomnia Anxiety Headache Low back pain Asthma  Past Surgical History: Reviewed history from 03/17/2007 and no changes required. Denies surgical history  Family History: Reviewed history from 09/02/2007 and no changes required. Family History Depression Colon Cancer  hx of colon cancer in uncle MI/Heart Attack  Social History: Reviewed history from 03/17/2007 and no changes required. Occupation: Single Current Smoker  Review of Systems  The patient denies anorexia, fever, weight loss, weight gain, vision loss, decreased hearing, hoarseness, chest pain, syncope, dyspnea on exertion, peripheral edema, prolonged cough, headaches, hemoptysis, abdominal pain, melena, hematochezia, severe indigestion/heartburn, hematuria, incontinence, genital sores, muscle weakness, suspicious skin lesions, transient  blindness, difficulty walking, depression, unusual weight change, abnormal bleeding, enlarged lymph nodes, angioedema, and breast masses.    Physical Exam  General:  Well-developed,well-nourished,in no acute distress; alert,appropriate and cooperative throughout examination Head:  Normocephalic and atraumatic without obvious abnormalities. No apparent alopecia or balding. Mouth:  Oral mucosa and oropharynx without lesions or exudates.  Teeth in good repair.pharyngeal exudate, posterior lymphoid hypertrophy, and postnasal drip.   Neck:  No deformities, masses, or tenderness noted. Lungs:  no wheezes.   Heart:  normal rate and regular rhythm.     Impression & Recommendations:  Problem # 1:  DIAB W/O COMP TYPE II/UNS NOT STATED UNCNTRL (ICD-250.00) Assessment Improved MUCH IMPROVED WITH THE ADITION OF THE NEW MEDICATION Her updated medication list for this problem includes:    Januvia 100 Mg Tabs (Sitagliptin phosphate) ..... One by mouth daily  Labs Reviewed: Creat: 0.6 (04/06/2009)    Reviewed HgBA1c results: 6.3 (05/26/2009)  7.3 (04/06/2009)  Complete Medication List: 1)  Symbicort 80-4.5 Mcg/act Aero (Budesonide-formoterol fumarate) .... One puff two times a day 2)  Ambien 10 Mg Tabs (Zolpidem tartrate) .... At bedtime as needed 3)  Omeprazole 20 Mg Cpdr (Omeprazole) .... By mouth daily. take one half hour before eating. as needed 4)  Januvia 100 Mg Tabs (Sitagliptin phosphate) .... One by mouth daily  Patient Instructions: 1)  Please schedule a follow-up appointment in 6 months. 2)  HbgA1C prior to visit, ICD-9: 250.00 3)  GET AN EYE EXAM Prescriptions: JANUVIA 100 MG TABS (SITAGLIPTIN PHOSPHATE) one by mouth daily  #30 x 11   Entered and Authorized by:   Balinda Quails  Lovell Sheehan MD   Signed by:   Stacie Glaze MD on 06/03/2009   Method used:   [facility]        [facility] [address] [phone]* (retail)       [address]       [place], [place]   [address]       Ph: [phone] or [phone]       Fax: [phone]   RxID:   [id number] OMEPRAZOLE 20 MG  CPDR (OMEPRAZOLE) By mouth daily. Take one half hour before eating. as needed  #30 x 3   Entered by:   Willy Eddy, LPN   Authorized by:   Stacie Glaze MD   Signed by:   Willy Eddy, LPN on 36/64/4034   Method used:   [facility]        [facility] [address] [phone]* (retail)       [address]       [place], [place]  [address]       Ph: [phone] or [phone]       Fax: [phone]   RxID:   [id number]

## 2010-06-22 NOTE — Assessment & Plan Note (Signed)
Summary: 6 wk rov/njr   Vital Signs:  Patient profile:   50 year old female Height:      66 inches Weight:      164 pounds BMI:     26.57 Temp:     98.2 degrees F oral Pulse rate:   76 / minute Resp:     14 per minute BP sitting:   110 / 70  (left arm)  Vitals Entered By: Willy Eddy, LPN (June 05, 2010 3:57 PM) CC: roa Is Patient Diabetic? Yes Did you bring your meter with you today? No   Primary Care Provider:  Stacie Glaze MD  CC:  roa.  History of Present Illness:   Patient ID: Denise Beard is a 50 y.o. female.  HPI Comments: IBS is GI issue  Diabetes She presents for her follow-up diabetic visit. She has type 2 diabetes mellitus. No MedicAlert identification noted. The initial diagnosis of diabetes was made 1 year ago. Her disease course has been stable. Hypoglycemia symptoms include headaches, hunger, sweats and tremors. Associated symptoms include blurred vision. Pertinent negatives for diabetes include no chest pain, no fatigue, no foot paresthesias, no foot ulcerations, no polydipsia, no polyuria and no weight loss. There are no hypoglycemic complications. Symptoms are stable. There are no diabetic complications. Risk factors for coronary artery disease include diabetes mellitus and tobacco exposure. Current diabetic treatment includes oral agent (monotherapy). She is compliant with treatment most of the time. Her weight is stable. She is following a generally healthy diet. Meal planning includes avoidance of concentrated sweets. She has not had a previous visit with a dietician. She rarely participates in exercise. There is no change in her home blood glucose trend. An ACE inhibitor/angiotensin II receptor blocker is not being taken. She does not see a podiatrist.Eye exam is not current.  Asthma She complains of hoarse voice. This is a recurrent problem. Associated symptoms include headaches and sweats. Pertinent negatives include no chest pain or weight loss.  Her symptoms are aggravated by change in weather. Her symptoms are alleviated by beta-agonist and steroid inhaler. She reports significant improvement on treatment. Risk factors for lung disease include no known risk factors. Her past medical history is significant for asthma.  GI Problem The primary symptoms include abdominal pain and diarrhea. Primary symptoms do not include weight loss or fatigue. The problem has not changed since onset. The abdominal pain is located in the LUQ. The severity of the abdominal pain is 5/10.    Common ambulatory   Preventive Screening-Counseling & Management  Alcohol-Tobacco     Smoking Status: current     Packs/Day: 0.25     Year Started: 1984     Year Quit: 2007     Tobacco Counseling: to quit use of tobacco products  Current Problems (verified): 1)  Seizure Disorder  (ICD-780.39) 2)  Irritable Bowel Syndrome  (ICD-564.1) 3)  Constipation, Chronic  (ICD-564.09) 4)  Unspec Polyarthropathy/polyarthrit Mx Sites  (ICD-716.59) 5)  Flank Pain, Left  (ICD-789.09) 6)  Postural Lightheadedness  (ICD-780.4) 7)  Diab w/o Comp Type Ii/uns Not Stated Uncntrl  (ICD-250.00) 8)  Routine General Medical Exam@health  Care Facl  (ICD-V70.0) 9)  Gerd  (ICD-530.81) 10)  Asthma  (ICD-493.90) 11)  Asthma Unspecified With Exacerbation  (ICD-493.92) 12)  Family History Depression  (ICD-V17.0) 13)  Low Back Pain  (ICD-724.2) 14)  Headache  (ICD-784.0) 15)  Anxiety  (ICD-300.00)  Current Medications (verified): 1)  Symbicort 80-4.5 Mcg/act Aero (Budesonide-Formoterol Fumarate) .Marland KitchenMarland KitchenMarland Kitchen  One Puff Two Times A Day 2)  Ambien 10 Mg  Tabs (Zolpidem Tartrate) .... At Bedtime As Needed 3)  Omeprazole 20 Mg  Cpdr (Omeprazole) .... By Mouth Daily. Take One Half Hour Before Eating. As Needed 4)  Januvia 100 Mg Tabs (Sitagliptin Phosphate) .... One By Mouth Daily 5)  Fexofenadine Hcl 180 Mg Tabs (Fexofenadine Hcl) .... One By Mouth Daily 6)  Vimovo 500-20 Mg Tbec  (Naproxen-Esomeprazole) .... One By Mouth Bid 7)  Amitiza 8 Mcg Caps (Lubiprostone) .... One By  Two Times A Day  Allergies (verified): 1)  ! Iodine   Complete Medication List: 1)  Symbicort 80-4.5 Mcg/act Aero (Budesonide-formoterol fumarate) .... One puff two times a day 2)  Ambien 10 Mg Tabs (Zolpidem tartrate) .... At bedtime as needed 3)  Omeprazole 20 Mg Cpdr (Omeprazole) .... By mouth daily. take one half hour before eating. as needed 4)  Januvia 100 Mg Tabs (Sitagliptin phosphate) .... One by mouth daily 5)  Fexofenadine Hcl 180 Mg Tabs (Fexofenadine hcl) .... One by mouth daily 6)  Vimovo 500-20 Mg Tbec (Naproxen-esomeprazole) .... One by mouth bid 7)  Amitiza 8 Mcg Caps (Lubiprostone) .... One by  two times a day  Other Orders: Venipuncture (30865) TLB-BMP (Basic Metabolic Panel-BMET) (80048-METABOL) TLB-A1C / Hgb A1C (Glycohemoglobin) (83036-A1C)  Patient Instructions: 1)  Please schedule a follow-up appointment in 3 months.   Orders Added: 1)  Est. Patient Level IV [78469] 2)  Venipuncture [36415] 3)  TLB-BMP (Basic Metabolic Panel-BMET) [80048-METABOL] 4)  TLB-A1C / Hgb A1C (Glycohemoglobin) [83036-A1C]  Appended Document: Orders Update    Clinical Lists Changes  Orders: Added new Service order of Specimen Handling (62952) - Signed

## 2010-06-22 NOTE — Consult Note (Signed)
Summary: [facility]  [facility]   Imported By: Maryln Gottron 04/21/2010 15:34:53  _____________________________________________________________________  External Attachment:    Type:   Image     Comment:   External Document

## 2010-06-22 NOTE — Assessment & Plan Note (Signed)
Summary: 6 MTH ROV, F/U ON LABWORK // RS----PT [facility] // RS   Vital Signs:  Patient profile:   50 year old female Height:      66 inches Weight:      164 pounds BMI:     26.57 Temp:     98.2 degrees F oral Pulse rate:   68 / minute Resp:     14 per minute BP sitting:   110 / 70  (left arm) Cuff size:   large  Vitals Entered By: Willy Eddy, LPN (December 13, 2009 11:18 AM) CC: roa labs Is Patient Diabetic? Yes Did you bring your meter with you today? No   CC:  roa labs.  History of Present Illness: multiple joint pains for 2 weeks with joint pain without swelling knees and ankles and elbows and shoulders severe pain and could not work left work since last thursday has been on tylenol arthritis Asthma good, DM good control   Preventive Screening-Counseling & Management  Alcohol-Tobacco     Smoking Status: current     Packs/Day: 0.25     Year Started: 1984     Year Quit: 2007  Problems Prior to Update: 1)  Unspec Polyarthropathy/polyarthrit Mx Sites  (ICD-716.59) 2)  Flank Pain, Left  (ICD-789.09) 3)  Postural Lightheadedness  (ICD-780.4) 4)  Diab w/o Comp Type Ii/uns Not Stated Uncntrl  (ICD-250.00) 5)  Acute Maxillary Sinusitis  (ICD-461.0) 6)  Routine General Medical Exam@health  Care Facl  (ICD-V70.0) 7)  Gerd  (ICD-530.81) 8)  Asthma  (ICD-493.90) 9)  Asthma Unspecified With Exacerbation  (ICD-493.92) 10)  Family History Depression  (ICD-V17.0) 11)  Low Back Pain  (ICD-724.2) 12)  Headache  (ICD-784.0) 13)  Anxiety  (ICD-300.00)  Current Problems (verified): 1)  Flank Pain, Left  (ICD-789.09) 2)  Postural Lightheadedness  (ICD-780.4) 3)  Diab w/o Comp Type Ii/uns Not Stated Uncntrl  (ICD-250.00) 4)  Acute Maxillary Sinusitis  (ICD-461.0) 5)  Routine General Medical Exam@health  Care Facl  (ICD-V70.0) 6)  Gerd  (ICD-530.81) 7)  Asthma  (ICD-493.90) 8)  Asthma Unspecified With Exacerbation  (ICD-493.92) 9)  Family History Depression  (ICD-V17.0) 10)  Low  Back Pain  (ICD-724.2) 11)  Headache  (ICD-784.0) 12)  Anxiety  (ICD-300.00)  Medications Prior to Update: 1)  Symbicort 80-4.5 Mcg/act Aero (Budesonide-Formoterol Fumarate) .... One Puff Two Times A Day 2)  Ambien 10 Mg  Tabs (Zolpidem Tartrate) .... At Bedtime As Needed 3)  Omeprazole 20 Mg  Cpdr (Omeprazole) .... By Mouth Daily. Take One Half Hour Before Eating. As Needed 4)  Januvia 100 Mg Tabs (Sitagliptin Phosphate) .... One By Mouth Daily 5)  Fexofenadine Hcl 180 Mg Tabs (Fexofenadine Hcl) .... One By Mouth Daily  Current Medications (verified): 1)  Symbicort 80-4.5 Mcg/act Aero (Budesonide-Formoterol Fumarate) .... One Puff Two Times A Day 2)  Ambien 10 Mg  Tabs (Zolpidem Tartrate) .... At Bedtime As Needed 3)  Omeprazole 20 Mg  Cpdr (Omeprazole) .... By Mouth Daily. Take One Half Hour Before Eating. As Needed 4)  Januvia 100 Mg Tabs (Sitagliptin Phosphate) .... One By Mouth Daily 5)  Fexofenadine Hcl 180 Mg Tabs (Fexofenadine Hcl) .... One By Mouth Daily 6)  Vimovo 500-20 Mg Tbec (Naproxen-Esomeprazole) .... One By Mouth Bid  Allergies (verified): 1)  ! Iodine  Past History:  Family History: Last updated: 09/02/2007 Family History Depression Colon Cancer  hx of colon cancer in uncle MI/Heart Attack  Social History: Last updated: 03/17/2007 Occupation: Single Current Smoker  Risk Factors: Smoking Status: current (12/13/2009) Packs/Day: 0.25 (12/13/2009)  Past medical, surgical, family and social histories (including risk factors) reviewed, and no changes noted (except as noted below).  Past Medical History: Reviewed history from 06/26/2007 and no changes required. Iron Defiency Insomnia Anxiety Headache Low back pain Asthma  Past Surgical History: Reviewed history from 03/17/2007 and no changes required. Denies surgical history  Family History: Reviewed history from 09/02/2007 and no changes required. Family History Depression Colon Cancer  hx of  colon cancer in uncle MI/Heart Attack  Social History: Reviewed history from 03/17/2007 and no changes required. Occupation: Single Current Smoker  Review of Systems  The patient denies anorexia, fever, weight loss, weight gain, vision loss, decreased hearing, hoarseness, chest pain, syncope, dyspnea on exertion, peripheral edema, prolonged cough, headaches, hemoptysis, abdominal pain, melena, hematochezia, severe indigestion/heartburn, hematuria, incontinence, genital sores, muscle weakness, suspicious skin lesions, transient blindness, difficulty walking, depression, unusual weight change, abnormal bleeding, enlarged lymph nodes, angioedema, and breast masses.    Physical Exam  General:  Well-developed,well-nourished,in no acute distress; alert,appropriate and cooperative throughout examination Head:  Normocephalic and atraumatic without obvious abnormalities. No apparent alopecia or balding. Eyes:  pupils equal and pupils round.   Ears:  R ear normal and L ear normal.   Nose:  no external deformity and no nasal discharge.   Mouth:  Oral mucosa and oropharynx without lesions or exudates.  Teeth in good repair.pharyngeal exudate, posterior lymphoid hypertrophy, and postnasal drip.   Neck:  No deformities, masses, or tenderness noted. Lungs:  no wheezes.  no dullness.   Heart:  normal rate and regular rhythm.   Abdomen:  soft, non-tender, and normal bowel sounds.     Impression & Recommendations:  Problem # 1:  DIAB W/O COMP TYPE II/UNS NOT STATED UNCNTRL (ICD-250.00) Assessment Improved  he pt has an a1c at goal and has lost weight Her updated medication list for this problem includes:    Januvia 100 Mg Tabs (Sitagliptin phosphate) ..... One by mouth daily  Labs Reviewed: Creat: 0.6 (04/06/2009)    Reviewed HgBA1c results: 5.8 (11/25/2009)  6.3 (05/26/2009)  Problem # 2:  ASTHMA (ICD-493.90) stable Her updated medication list for this problem includes:    Symbicort 80-4.5  Mcg/act Aero (Budesonide-formoterol fumarate) ..... One puff two times a day  Pulmonary Functions Reviewed: O2 sat: 100 (06/26/2007)  Problem # 3:  UNSPEC POLYARTHROPATHY/POLYARTHRIT MX SITES (ICD-716.59) Assessment: New no effusions of redness  trial of vimovo  Complete Medication List: 1)  Symbicort 80-4.5 Mcg/act Aero (Budesonide-formoterol fumarate) .... One puff two times a day 2)  Ambien 10 Mg Tabs (Zolpidem tartrate) .... At bedtime as needed 3)  Omeprazole 20 Mg Cpdr (Omeprazole) .... By mouth daily. take one half hour before eating. as needed 4)  Januvia 100 Mg Tabs (Sitagliptin phosphate) .... One by mouth daily 5)  Fexofenadine Hcl 180 Mg Tabs (Fexofenadine hcl) .... One by mouth daily 6)  Vimovo 500-20 Mg Tbec (Naproxen-esomeprazole) .... One by mouth bid  Patient Instructions: 1)  Please schedule a follow-up appointment in 3 months.

## 2010-06-22 NOTE — Progress Notes (Signed)
  Phone Note Call from Patient   Caller: Patient Call For: Stacie Glaze MD Summary of Call: Pt blacked out Wed night with uncontrolled vomiting and bowels.  Daughter found her.   No other symptoms.  What next? Possibly a little light headed since. [phone] [facility])  Initial call taken by: Lynann Beaver CMA AAMA,  April 17, 2010 11:29 AM  Follow-up for Phone Call        sound  like this may be a seizure no driving referral to neurology ASAP may use [facility] if cannot get into [facility] Follow-up by: Stacie Glaze MD,  April 17, 2010 1:28 PM  New Problems: SEIZURE DISORDER (ICD-780.39)   New Problems: SEIZURE DISORDER (ICD-780.39)   Left DETAILED message with NO DRIVING on pt's voice mail, and orders sent to [facility].

## 2010-06-22 NOTE — Assessment & Plan Note (Signed)
Summary: INTESTINAL PROBLEMS/CONSTIPATION/CJR   Vital Signs:  Patient profile:   50 year old female Weight:      165 pounds Temp:     98.2 degrees F oral BP sitting:   110 / 80  (left arm) Cuff size:   regular  Vitals Entered By: Sid Falcon LPN (January 26, 2010 2:47 PM)  History of Present Illness: Patient is seen with one month history of increased constipation which is a new finding. She relates both decreased frequency of stools and increased straining. Sensation of pressure in her rectum. She has felt bloated at times. No nausea or vomiting. Denies any appetite or weight changes. No bloody stools. Does not drink much water.  No alleviating or aggravating factors.  Patient has history of type 2 diabetes on Januvia. Also history of asthma. Takes no anticholinergics or other constipating medications. Family history significant for uncle with colon cancer.  Allergies: 1)  ! Iodine  Past History:  Past Medical History: Last updated: 06/26/2007 Iron Defiency Insomnia Anxiety Headache Low back pain Asthma PMH reviewed for relevance  Review of Systems  The patient denies anorexia, fever, weight loss, chest pain, abdominal pain, melena, hematochezia, severe indigestion/heartburn, and hematuria.    Physical Exam  General:  Well-developed,well-nourished,in no acute distress; alert,appropriate and cooperative throughout examination Mouth:  Oral mucosa and oropharynx without lesions or exudates.  Teeth in good repair. Neck:  No deformities, masses, or tenderness noted. Lungs:  Normal respiratory effort, chest expands symmetrically. Lungs are clear to auscultation, no crackles or wheezes. Heart:  normal rate and regular rhythm.   Abdomen:  soft, non-tender, normal bowel sounds, no masses, no hepatomegaly, and no splenomegaly.   Rectal:  no rectal mass. Stool is brown and Hemoccult negative   Impression & Recommendations:  Problem # 1:  CONSTIPATION  (ICD-564.00) Assessment New measures to prevent and rx discussed.  Follow up with primary in 2-3 weeks if no improvement.  Consider colonscopy screening if not improving with measures discussed. Orders: TLB-TSH (Thyroid Stimulating Hormone) (84443-TSH) TLB-CBC Platelet - w/Differential (85025-[id number]) Venipuncture (82956) Specimen Handling (21308)  Problem # 2:  DIAB W/O COMP TYPE II/UNS NOT STATED UNCNTRL (ICD-250.00)  Her updated medication list for this problem includes:    Januvia 100 Mg Tabs (Sitagliptin phosphate) ..... One by mouth daily  Complete Medication List: 1)  Symbicort 80-4.5 Mcg/act Aero (Budesonide-formoterol fumarate) .... One puff two times a day 2)  Ambien 10 Mg Tabs (Zolpidem tartrate) .... At bedtime as needed 3)  Omeprazole 20 Mg Cpdr (Omeprazole) .... By mouth daily. take one half hour before eating. as needed 4)  Januvia 100 Mg Tabs (Sitagliptin phosphate) .... One by mouth daily 5)  Fexofenadine Hcl 180 Mg Tabs (Fexofenadine hcl) .... One by mouth daily 6)  Vimovo 500-20 Mg Tbec (Naproxen-esomeprazole) .... One by mouth bid  Patient Instructions: 1)  Drink plenty of fluids 2)  Fiber of >25 grams per day.  Fruits, vegs, and whole grain breads and cereals. 3)  Exercise with walking regularly. 4)  Stool softener 1-2 daily such as Colace. 5)  Short term use only of Miralax as needed  6)  Follow up with Dr Lovell Sheehan if constipation no better in 2-3 weeks.

## 2010-06-23 ENCOUNTER — Ambulatory Visit (INDEPENDENT_AMBULATORY_CARE_PROVIDER_SITE_OTHER): Payer: 59 | Admitting: Internal Medicine

## 2010-06-23 ENCOUNTER — Encounter: Payer: Self-pay | Admitting: Internal Medicine

## 2010-06-23 ENCOUNTER — Telehealth: Payer: Self-pay | Admitting: *Deleted

## 2010-06-23 VITALS — BP 110/70 | HR 72 | Temp 99.0°F | Resp 14 | Ht 66.0 in | Wt 164.0 lb

## 2010-06-23 DIAGNOSIS — R51 Headache: Secondary | ICD-10-CM

## 2010-06-23 DIAGNOSIS — J019 Acute sinusitis, unspecified: Secondary | ICD-10-CM

## 2010-06-23 DIAGNOSIS — R0981 Nasal congestion: Secondary | ICD-10-CM

## 2010-06-23 DIAGNOSIS — J3489 Other specified disorders of nose and nasal sinuses: Secondary | ICD-10-CM

## 2010-06-23 DIAGNOSIS — R519 Headache, unspecified: Secondary | ICD-10-CM

## 2010-06-23 MED ORDER — MOXIFLOXACIN HCL 400 MG PO TABS: 400.0000 mg | ORAL_TABLET | Freq: Every day | ORAL | Status: DC

## 2010-06-23 MED ORDER — PHENYLEPHRINE-APAP-GUAIFENESIN 10-650-400 MG/20ML PO LIQD: 5.0000 mL | Freq: Four times a day (QID) | ORAL | Status: DC | PRN

## 2010-06-23 NOTE — Telephone Encounter (Signed)
Phone pt and advise of appt time

## 2010-06-23 NOTE — Patient Instructions (Signed)
Place sinusitis patient instructions here.

## 2010-06-23 NOTE — Progress Notes (Signed)
  Subjective:     Denise Beard is a 50 y.o. female who presents for evaluation of sinus pain. Symptoms include: congestion, facial pain, fevers, headaches, mouth breathing, nasal congestion, puffiness of the eyes and sinus pressure. Onset of symptoms was several days ago. Symptoms have been gradually worsening since that time. Past history is significant for no history of pneumonia or bronchitis. Patient is a non-smoker.  The following portions of the patient's history were reviewed and updated as appropriate: allergies, current medications, past family history, past medical history, past social history, past surgical history and problem list.  Review of Systems Constitutional: negative Eyes: positive for irritation and redness Ears, nose, mouth, throat, and face: positive for hearing loss, hoarseness, nasal congestion, sore throat and voice change Respiratory: positive for dyspnea on exertion and sputum Cardiovascular: negative Gastrointestinal: negative Allergic/Immunologic: positive for urticaria   Objective:    BP 110/70  Pulse 72  Temp(Src) 99 F (37.2 C) (Oral)  Resp 14  Ht 5\' 6"  (1.676 m)  Wt 164 lb (74.39 kg)  BMI 26.47 kg/m2 General appearance: flushed and mild distress Head: Normocephalic, without obvious abnormality, atraumatic, sinuses tender to percussion Eyes: positive findings: conjunctiva: negative dry eye, trace injection Ears: abnormal TM right ear - retracted Nose: Nares normal. Septum midline. Mucosa normal. No drainage or sinus tenderness., turbinates red, swollen, edematous, inflamed Throat: lips, mucosa, and tongue normal; teeth and gums normal Neck: no adenopathy, no carotid bruit, no JVD, supple, symmetrical, trachea midline and thyroid not enlarged, symmetric, no tenderness/mass/nodules Lungs: clear to auscultation bilaterally Heart: regular rate and rhythm, S1, S2 normal, no murmur, click, rub or gallop Abdomen: soft, non-tender; bowel sounds normal; no  masses,  no organomegaly Extremities: extremities normal, atraumatic, no cyanosis or edema    Assessment:    Acute bacterial sinusitis.    Plan:    Nasal saline sprays. Antihistamines per medication orders. avelox per medication orders.  Follow up call in 2-3 days if not improved Work excuse given

## 2010-06-23 NOTE — Telephone Encounter (Signed)
I will open this slot please schedule and notify pt//slm

## 2010-06-23 NOTE — Telephone Encounter (Signed)
Pt would like to be treated with antibiotics for sinus infection or come in for an appointment.

## 2010-06-23 NOTE — Telephone Encounter (Signed)
4 pm today

## 2010-07-03 ENCOUNTER — Telehealth: Payer: Self-pay | Admitting: Internal Medicine

## 2010-07-03 DIAGNOSIS — J329 Chronic sinusitis, unspecified: Secondary | ICD-10-CM

## 2010-07-03 MED ORDER — AZITHROMYCIN 250 MG PO TABS: ORAL_TABLET | ORAL | Status: AC

## 2010-07-03 NOTE — Telephone Encounter (Signed)
Per dr jenkins- may have a zpack 

## 2010-07-03 NOTE — Telephone Encounter (Signed)
Pt req refill of antibiotic for sinus inf. Pls call in to [facility]

## 2010-07-03 NOTE — Telephone Encounter (Signed)
Left message to stop Avelox and start ZPack

## 2010-08-08 ENCOUNTER — Other Ambulatory Visit: Payer: Self-pay | Admitting: Gynecology

## 2010-08-08 DIAGNOSIS — R928 Other abnormal and inconclusive findings on diagnostic imaging of breast: Secondary | ICD-10-CM

## 2010-08-11 ENCOUNTER — Ambulatory Visit
Admission: RE | Admit: 2010-08-11 | Discharge: 2010-08-11 | Disposition: A | Discharge status: Home / Self Care | Payer: 59 | Source: Ambulatory Visit | Attending: Gynecology | Admitting: Gynecology

## 2010-08-11 DIAGNOSIS — R928 Other abnormal and inconclusive findings on diagnostic imaging of breast: Secondary | ICD-10-CM

## 2010-08-29 ENCOUNTER — Other Ambulatory Visit: Payer: Self-pay | Admitting: Internal Medicine

## 2010-10-06 NOTE — H&P (Signed)
NAME:  Denise Beard, Denise Beard                         ACCOUNT NO.:  [id number]   MEDICAL RECORD NO.:  [id number]                   PATIENT TYPE:  INP   LOCATION:  0457                                 FACILITY:  [facility]   PHYSICIAN:  Leatha Gilding. Mezer, M.D.               DATE OF BIRTH:  1961-04-01   DATE OF ADMISSION:  12/24/2001  DATE OF DISCHARGE:  12/25/2001                                HISTORY & PHYSICAL   ADMISSION DIAGNOSIS:  Fibroid uterus.   HISTORY OF PRESENT ILLNESS:  The patient is a 50 year old gravida 4, para 2,  AB2, female status post tubal ligation admitted with last menstrual period  on 12/16/01, with fibroid uterus, severe dysmenorrhea, and anemia for a total  abdominal hysterectomy.  The patient has been followed for a number of years  with an enlarging fibroid uterus and increasing pelvic pain, menorrhagia,  dysmenorrhea, and anemia.  The patient did somewhat better with Bextra for  the dysmenorrhea, but is still not functional when she has her menstrual  period, and has elected to proceed with hysterectomy.  A total abdominal  hysterectomy has been discussed with the patient in detail, including  potential complications.  The patient has received and reviewed the ACOG  booklet on hysterectomy.  The possibility of leaving the cervix was  discussed with the patient, and she preferred to remove the cervix.  She  wishes to retain her ovaries unless there is a surgical indication to remove  the ovaries.  She understands that this will result in permanent  sterilization.  Postoperative restrictions and expectations have been  discussed in detail.  Pain control has been reviewed.  Potential for  increased complications secondary to the previous cesarean section have been  discussed.  All of the patient's questions were answered, and she appears to  understand the procedure well, and the limitations.  She understands that  there is no guarantee to relieve her pelvic pain with this  procedure.   PAST MEDICAL HISTORY:  1. Tubal ligation.  2. D&E with second tubal ligation.  3. Cesarean section.  4. Umbilical hernia.   MEDICATIONS:  1. Xanax.  2. An antidepressant.  No herbs or supplements.   ALLERGIES:  LODE INE.   SOCIAL HISTORY:  The patient is employed at [employer].  She  smokes approximately 1/2 pack of cigarettes per day.  ETOH not significant.   FAMILY HISTORY:  Noncontributory.   PHYSICAL EXAMINATION:  HEENT:  Negative.  LUNGS:  Clear.  HEART:  Without murmurs.  BREASTS:  Without masses or discharge.  ABDOMEN:  Soft and nontender.  PELVIC:  Vagina and cervix to be normal.  Uterus is approximately 16 weeks  in size.  The adnexa are without topical masses.  EXTREMITIES:  Negative.   IMPRESSION:  1. Fibroid uterus.  2. Dysmenorrhea.  3. Anemia.   PLAN:  Total abdominal hysterectomy, question bilateral salpingo-  oophorectomy.  Leatha Gilding. Mezer, M.D.    HCM/MEDQ  D:  12/24/2001  T:  12/28/2001  Job:  [id number]   cc:   Stacie Glaze, M.D. [facility]

## 2010-10-06 NOTE — Op Note (Signed)
Denise Beard, Denise Beard                        ACCOUNT NO.:  [id number]   MEDICAL RECORD NO.:  [id number]                   PATIENT TYPE:  INP   LOCATION:  0002                                 FACILITY:  [facility]   PHYSICIAN:  Howard C. Mezer, M.D.               DATE OF BIRTH:  July 19, 1960   DATE OF PROCEDURE:  12/24/2001  DATE OF DISCHARGE:                                 OPERATIVE REPORT   PREOPERATIVE DIAGNOSES:  1. Fibroid uterus.  2. Dysmenorrhea.  3. Anemia.   POSTOPERATIVE DIAGNOSES:  1. Fibroid uterus.  2. Dysmenorrhea.  3. Anemia.  4. Adhesions.   OPERATION PERFORMED:  Total abdominal hysterectomy and partial omentectomy.   SURGEON:  Leatha Gilding. Mezer, M.D.   ASSISTANT:  Almedia Balls. Randell Patient, M.D.   ANESTHESIA:  Endotracheal.   PREPARATION:  Betadine.   DESCRIPTION OF PROCEDURE:  With the patient in the supine position, was  prepped and draped in the routine fashion.  A Pfannenstiel incision was made  above the previous incision.  The previous incision was just on the pubic  symphysis.  The incision was carried down through the subcutaneous tissue,  and the fascia was opened without difficulty.  The lateral parts of the  fascia were quite thin.  The peritoneum was opened without difficulty and  upon entering the peritoneal cavity, it was noted that there were adhesions  of omentum to the anterior abdominal wall.  These extended from the bladder  to the umbilicus.  These adhesions were taken down sharply and bluntly.  The  omentum was quite thickened, and this dissection required a considerable  amount of time.  There were numerous bleeding areas in the omentum as well  as several windows.  A partial omentectomy was then done by serially cross-  clamping the omentum and suture ligating the large pedicles and free tying  the small pedicles with #1 chromic suture.  Careful attention was paid to  hemostasis, both at the time of the partial omentectomy and again at the end  of the procedure upon closing.  Exploration of the pelvic revealed the  uterus to be approximately 14 weeks in size, and both ovaries were grossly  normal.  The bladder was slightly advanced on the uterus.  The round  ligaments were suture ligated with #1 chromic and divided with cautery.  The  uteroovarian ligaments isolated, clamped, cut, and free tied with #1 chromic  and suture ligated with #1 chromic.  The anterior leaf of the broad ligament  was then opened and the bladder taken down without difficulty.  The uterine  artery and veins were then clamped, cut, and suture ligated with #1 chromic,  the cardinal ligament taken in several bites, clamped, cut, and suture  ligated with #1 chromic.  The uterosacral ligaments were taken separately  very close to the cervix, clamped, cut, and suture ligated with #1 chromic.  The easiest entry point  into the vagina was through the cul-de-sac side.  The vagina was entered cleanly and the specimen excised with circumferential  dissection.  TeLinde type angle sutures of #1 chromic were then placed in  the vaginal cuff.  The cuff was whipped anteriorly and posteriorly with  running lock #1 chromic suture, me taking care to exclude the bladder.  Upon  finishing the cuff, it was noted that there was a significant amount of  oozing coming from the base of the bladder.  This was very slowly and  carefully arrested with cautery, and the bleeding did not stop easily.  Because of concern for the potential for causing a vesicovaginal fistula,  pressure was applied to aid in hemostasis, and the tissue was looked at at  multiple angles to try to find the precise bleeding points to minimize the  amount of cautery.  There did not appear to be a reasonable place to place  stitches, and the situation was resolved with very slow and careful  attention to the bleeding.  The cuff was then approximated with two  interrupted #1 chromic sutures.  The pelvis was irrigated  with copious  amounts of warm lactated Ringer's solution, all of the pedicles carefully  inspected and with hemostasis intact, the bladder was placed over the cuff  with a running 3-0 Vicryl suture.  Both ureters were identified, found to be  out of harm's way, and peristalsing bilaterally.  Hemostasis appeared to be  completely assured.  As mentioned above, the omentum was reinspected.  The  abdomen was then closed in layers using a running 2-0 Vicryl on the  peritoneum, running 0 Vicryl to the midline bilaterally on the fascia,  taking care to close the angles as well as possible given the limitations of  the devitalized tissue.  Hemostasis was assured in the subcutaneous tissue,  and the skin was closed with staples.  The estimated blood loss was  approximately 300 cc.  The sponge, instrument, and needle counts were  correct x 2.  The lysis of adhesions and partial omentectomy consumed  approximately the first 45 minutes of the procedure.  The patient tolerated  the procedure well and was taken to recovery room in satisfactory condition.                                               Leatha Gilding. Mezer, M.D.    HCM/MEDQ  D:  12/24/2001  T:  12/27/2001  Job:  [id number]   cc:   Almedia Balls. Randell Patient, M.D.   Stacie Glaze, M.D. [facility]

## 2010-10-26 ENCOUNTER — Other Ambulatory Visit: Payer: Self-pay | Admitting: *Deleted

## 2010-10-26 MED ORDER — ZOLPIDEM TARTRATE 10 MG PO TABS: 10.0000 mg | ORAL_TABLET | Freq: Every evening | ORAL | Status: DC | PRN

## 2011-02-26 ENCOUNTER — Other Ambulatory Visit (INDEPENDENT_AMBULATORY_CARE_PROVIDER_SITE_OTHER): Payer: 59

## 2011-02-26 DIAGNOSIS — Z0000 Encounter for general adult medical examination without abnormal findings: Secondary | ICD-10-CM

## 2011-02-26 LAB — POCT URINALYSIS DIPSTICK
Blood, UA: NEGATIVE
Nitrite, UA: NEGATIVE
Spec Grav, UA: 1.03
Urobilinogen, UA: 0.2

## 2011-02-26 LAB — BASIC METABOLIC PANEL
BUN: 9 mg/dL (ref 6–23)
Creatinine, Ser: 0.7 mg/dL (ref 0.4–1.2)
GFR: 116.42 mL/min (ref >60.00)
Glucose, Bld: 231 mg/dL — ABNORMAL HIGH (ref 70–99)

## 2011-02-26 LAB — HEMOGLOBIN A1C: Hgb A1c MFr Bld: 8.6 % — ABNORMAL HIGH (ref 4.6–6.5)

## 2011-02-26 LAB — CBC WITH DIFFERENTIAL/PLATELET
Basophils Absolute: 0 10*3/uL (ref 0.0–0.1)
Eosinophils Relative: 2 % (ref 0.0–5.0)
Monocytes Absolute: 0.7 10*3/uL (ref 0.1–1.0)
Monocytes Relative: 8.6 % (ref 3.0–12.0)
Neutrophils Relative %: 52.6 % (ref 43.0–77.0)
Platelets: 226 10*3/uL (ref 150.0–400.0)
RDW: 12.7 % (ref 11.5–14.6)
WBC: 7.6 10*3/uL (ref 4.5–10.5)

## 2011-02-26 LAB — LIPID PANEL
Cholesterol: 231 mg/dL — ABNORMAL HIGH (ref 0–200)
HDL: 43 mg/dL (ref >39.00)
Total CHOL/HDL Ratio: 5
VLDL: 21.6 mg/dL (ref 0.0–40.0)

## 2011-02-26 LAB — LDL CHOLESTEROL, DIRECT: Direct LDL: 169.2 mg/dL

## 2011-02-26 LAB — HEPATIC FUNCTION PANEL
ALT: 12 U/L (ref 0–35)
AST: 15 U/L (ref 0–37)
Bilirubin, Direct: 0 mg/dL (ref 0.0–0.3)
Total Bilirubin: 0.4 mg/dL (ref 0.3–1.2)

## 2011-02-26 LAB — MICROALBUMIN / CREATININE URINE RATIO: Microalb, Ur: 3.3 mg/dL — ABNORMAL HIGH (ref 0.0–1.9)

## 2011-03-05 ENCOUNTER — Ambulatory Visit (INDEPENDENT_AMBULATORY_CARE_PROVIDER_SITE_OTHER): Payer: 59 | Admitting: Internal Medicine

## 2011-03-05 ENCOUNTER — Encounter: Payer: Self-pay | Admitting: Internal Medicine

## 2011-03-05 VITALS — BP 140/90 | HR 72 | Temp 98.2°F | Resp 16 | Ht 66.0 in | Wt 160.0 lb

## 2011-03-05 DIAGNOSIS — J45909 Unspecified asthma, uncomplicated: Secondary | ICD-10-CM

## 2011-03-05 DIAGNOSIS — K5909 Other constipation: Secondary | ICD-10-CM

## 2011-03-05 DIAGNOSIS — E119 Type 2 diabetes mellitus without complications: Secondary | ICD-10-CM

## 2011-03-05 DIAGNOSIS — K589 Irritable bowel syndrome without diarrhea: Secondary | ICD-10-CM

## 2011-03-05 DIAGNOSIS — I10 Essential (primary) hypertension: Secondary | ICD-10-CM

## 2011-03-05 DIAGNOSIS — Z0000 Encounter for general adult medical examination without abnormal findings: Secondary | ICD-10-CM

## 2011-03-05 MED ORDER — SAXAGLIPTIN-METFORMIN ER 5-500 MG PO TB24: 1.0000 | ORAL_TABLET | Freq: Every day | ORAL | Status: DC

## 2011-03-05 NOTE — Progress Notes (Signed)
  Subjective:    Patient ID: Denise Beard, female    DOB: 1961/04/15, 50 y.o.   MRN: [id number]  HPI  She presents for a wellness examination her current problems include diabetes asthma anxiety she admits recently that she has been noncompliant with her diabetic medications and diabetic diet.  She does have history of constipation prominent. The bowel  Review of Systems  Constitutional: Negative for activity change, appetite change and fatigue.  HENT: Negative for ear pain, congestion, neck pain, postnasal drip and sinus pressure.   Eyes: Negative for redness and visual disturbance.  Respiratory: Negative for cough, shortness of breath and wheezing.   Gastrointestinal: Negative for abdominal pain and abdominal distention.  Genitourinary: Negative for dysuria, frequency and menstrual problem.  Musculoskeletal: Negative for myalgias, joint swelling and arthralgias.  Skin: Negative for rash and wound.  Neurological: Negative for dizziness, weakness and headaches.  Hematological: Negative for adenopathy. Does not bruise/bleed easily.  Psychiatric/Behavioral: Negative for sleep disturbance and decreased concentration.       Objective:   Physical Exam  Nursing note and vitals reviewed. Constitutional: She is oriented to person, place, and time. She appears well-developed and well-nourished. No distress.  HENT:  Head: Normocephalic and atraumatic.  Right Ear: External ear normal.  Left Ear: External ear normal.  Nose: Nose normal.  Mouth/Throat: Oropharynx is clear and moist.  Eyes: Conjunctivae and EOM are normal. Pupils are equal, round, and reactive to light.  Neck: Normal range of motion. Neck supple. No JVD present. No tracheal deviation present. No thyromegaly present.  Cardiovascular: Normal rate, regular rhythm, normal heart sounds and intact distal pulses.   No murmur heard. Pulmonary/Chest: Effort normal and breath sounds normal. She has no wheezes. She exhibits no  tenderness.  Abdominal: Soft. Bowel sounds are normal.  Musculoskeletal: Normal range of motion. She exhibits no edema and no tenderness.  Lymphadenopathy:    She has no cervical adenopathy.  Neurological: She is alert and oriented to person, place, and time. She has normal reflexes. No cranial nerve deficit.  Skin: Skin is warm and dry. She is not diaphoretic.  Psychiatric: She has a normal mood and affect. Her behavior is normal.          Assessment & Plan:   This is a routine physical examination for this healthy  Female. Reviewed all health maintenance protocols including mammography colonoscopy bone density and reviewed appropriate screening labs. Her immunization history was reviewed as well as her current medications and allergies refills of her chronic medications were given and the plan for yearly health maintenance was discussed all orders and referrals were made as appropriate.  Patient has been noncompliant with her diabetic medication for several months we discussed the importance of taking the Januvia for her diabetic control her elevated blood glucose and elevated hemoglobin A1c or evidence of poor control we discussed referral to diabetic counseling and diabetic nutrition she defers at this point believing that she can get "practice together"

## 2011-03-05 NOTE — Patient Instructions (Signed)
Patient was instructed to continue all medications as prescribed. To stop at the checkout desk and schedule a followup appointment  

## 2011-03-07 ENCOUNTER — Other Ambulatory Visit: Payer: Self-pay | Admitting: Internal Medicine

## 2011-03-23 ENCOUNTER — Telehealth: Payer: Self-pay | Admitting: Internal Medicine

## 2011-03-23 MED ORDER — ONETOUCH ULTRASOFT LANCETS MISC: Status: AC

## 2011-03-23 MED ORDER — GLUCOSE BLOOD VI STRP: ORAL_STRIP | Status: DC

## 2011-03-23 NOTE — Telephone Encounter (Signed)
PT. NEEDS REFILL FOR THE ONE TOUCH ULTRA 2 (STRIPS & LANCETS)

## 2011-03-26 ENCOUNTER — Telehealth: Payer: Self-pay | Admitting: *Deleted

## 2011-03-26 MED ORDER — LEVOFLOXACIN 500 MG PO TABS: 500.0000 mg | ORAL_TABLET | Freq: Every day | ORAL | Status: AC

## 2011-03-26 NOTE — Telephone Encounter (Signed)
Pt is complaining of sinus infection, congestion, cough, headache and sinus pressure.  RX request to [facility])

## 2011-03-26 NOTE — Telephone Encounter (Signed)
Notified pt. 

## 2011-03-26 NOTE — Telephone Encounter (Signed)
Per dr jenkins- m ay have levaquin 500 qd for 7 days 

## 2011-04-27 ENCOUNTER — Telehealth: Payer: Self-pay | Admitting: Internal Medicine

## 2011-04-27 MED ORDER — ZOLPIDEM TARTRATE 10 MG PO TABS: 10.0000 mg | ORAL_TABLET | Freq: Every evening | ORAL | Status: DC | PRN

## 2011-04-27 NOTE — Telephone Encounter (Signed)
Called in.

## 2011-04-27 NOTE — Telephone Encounter (Signed)
Pt requesting refill on zolpidem (AMBIEN) 10 MG tablet   [facility]

## 2011-06-28 ENCOUNTER — Other Ambulatory Visit (INDEPENDENT_AMBULATORY_CARE_PROVIDER_SITE_OTHER): Payer: BC Managed Care – PPO

## 2011-06-28 DIAGNOSIS — E119 Type 2 diabetes mellitus without complications: Secondary | ICD-10-CM

## 2011-06-28 LAB — HEMOGLOBIN A1C: Hgb A1c MFr Bld: 6.6 % — ABNORMAL HIGH (ref 4.6–6.5)

## 2011-07-06 ENCOUNTER — Ambulatory Visit: Payer: 59 | Admitting: Internal Medicine

## 2011-07-06 ENCOUNTER — Encounter: Payer: Self-pay | Admitting: Internal Medicine

## 2011-07-06 ENCOUNTER — Ambulatory Visit (INDEPENDENT_AMBULATORY_CARE_PROVIDER_SITE_OTHER): Payer: BC Managed Care – PPO | Admitting: Internal Medicine

## 2011-07-06 VITALS — BP 136/80 | HR 72 | Temp 98.2°F | Resp 16 | Ht 66.0 in | Wt 166.0 lb

## 2011-07-06 DIAGNOSIS — IMO0001 Reserved for inherently not codable concepts without codable children: Secondary | ICD-10-CM

## 2011-07-06 DIAGNOSIS — J019 Acute sinusitis, unspecified: Secondary | ICD-10-CM

## 2011-07-06 DIAGNOSIS — J449 Chronic obstructive pulmonary disease, unspecified: Secondary | ICD-10-CM

## 2011-07-06 NOTE — Progress Notes (Signed)
  Subjective:    Patient ID: Denise Beard, female    DOB: 07-26-1960, 51 y.o.   MRN: [id number]  HPI Patient is a 51 year old female who presents for followup of her diabetes.  She is recently done much better control of her diabetes as evidenced by her hemoglobin A1c dropping from 8.6-6.6.  But in her efforts to better control her diabetes.  She has experienced increased episodes that appear to be hypoglycemia.   Review of Systems  Constitutional: Negative for activity change, appetite change and fatigue.  HENT: Positive for congestion, rhinorrhea, sneezing, postnasal drip and sinus pressure. Negative for ear pain and neck pain.   Eyes: Negative for redness and visual disturbance.  Respiratory: Negative for cough, shortness of breath and wheezing.   Gastrointestinal: Negative for abdominal pain and abdominal distention.  Genitourinary: Negative for dysuria, frequency and menstrual problem.  Musculoskeletal: Negative for myalgias, joint swelling and arthralgias.  Skin: Negative for rash and wound.  Neurological: Positive for headaches. Negative for dizziness and weakness.  Hematological: Negative for adenopathy. Does not bruise/bleed easily.  Psychiatric/Behavioral: Negative for sleep disturbance and decreased concentration.       Objective:   Physical Exam  Nursing note and vitals reviewed. Constitutional: She is oriented to person, place, and time. She appears well-developed and well-nourished. No distress.  HENT:  Head: Normocephalic and atraumatic.       Swollen nasal turbinates with posterior cobblestoning and pharyngeal erythema  Eyes: Conjunctivae and EOM are normal. Pupils are equal, round, and reactive to light.  Neck: Normal range of motion. Neck supple. No JVD present. No tracheal deviation present. No thyromegaly present.  Cardiovascular: Normal rate, regular rhythm, normal heart sounds and intact distal pulses.   No murmur heard. Pulmonary/Chest: Effort normal  and breath sounds normal. She has no wheezes. She exhibits no tenderness.  Abdominal: Soft. Bowel sounds are normal.  Musculoskeletal: Normal range of motion. She exhibits no edema and no tenderness.  Lymphadenopathy:    She has no cervical adenopathy.  Neurological: She is alert and oriented to person, place, and time. She has normal reflexes. No cranial nerve deficit.  Skin: Skin is warm and dry. She is not diaphoretic.  Psychiatric: She has a normal mood and affect. Her behavior is normal.          Assessment & Plan:  Patient presents for followup of her diabetes and monitoring with hemoglobin A1c today.  CBGs have been within range.  she has an acute complaint of sinus infection physical exam confirms this diagnosis she was placed on antibiotic.  Blood pressure is stable on current medications  As a mild flare of her asthma with the sinus infection and should continue the symbicort twice daily as directed  She has chronic constipation on MMT for

## 2011-07-06 NOTE — Patient Instructions (Signed)

## 2011-08-21 ENCOUNTER — Other Ambulatory Visit: Payer: Self-pay | Admitting: *Deleted

## 2011-08-21 ENCOUNTER — Encounter: Payer: Self-pay | Admitting: *Deleted

## 2011-08-21 MED ORDER — ZOLPIDEM TARTRATE 10 MG PO TABS: 10.0000 mg | ORAL_TABLET | Freq: Every evening | ORAL | Status: DC | PRN

## 2011-10-03 ENCOUNTER — Encounter: Payer: Self-pay | Admitting: Internal Medicine

## 2011-10-03 ENCOUNTER — Ambulatory Visit (INDEPENDENT_AMBULATORY_CARE_PROVIDER_SITE_OTHER): Payer: BC Managed Care – PPO | Admitting: Internal Medicine

## 2011-10-03 VITALS — BP 136/82 | HR 72 | Temp 98.1°F | Resp 16 | Ht 65.0 in | Wt 164.0 lb

## 2011-10-03 DIAGNOSIS — E119 Type 2 diabetes mellitus without complications: Secondary | ICD-10-CM

## 2011-10-03 DIAGNOSIS — K589 Irritable bowel syndrome without diarrhea: Secondary | ICD-10-CM

## 2011-10-03 DIAGNOSIS — J45909 Unspecified asthma, uncomplicated: Secondary | ICD-10-CM

## 2011-10-03 NOTE — Patient Instructions (Signed)
The patient is instructed to continue all medications as prescribed. Schedule followup with check out clerk upon leaving the clinic  

## 2011-10-03 NOTE — Progress Notes (Signed)
Subjective:    Patient ID: Denise Beard, female    DOB: [birth year], 51 y.o.   MRN: [id number]  HPI Patient is followed for history of diabetes and gastroesophageal reflux and a history of asthmatic bronchitis.  Generally she is feeling well but is having trouble controlling her blood sugars. Does have trouble controlling her CBGs she states that some of it is dietary compliance issues but otherwise she is having trouble getting the general ABGs when she is on diet in a range that is acceptable.  Her last A1c was 6.6 which was very good   Review of Systems  Constitutional: Negative for activity change, appetite change and fatigue.  HENT: Negative for ear pain, congestion, neck pain, postnasal drip and sinus pressure.   Eyes: Negative for redness and visual disturbance.  Respiratory: Negative for cough, shortness of breath and wheezing.   Gastrointestinal: Negative for abdominal pain and abdominal distention.  Genitourinary: Negative for dysuria, frequency and menstrual problem.  Musculoskeletal: Negative for myalgias, joint swelling and arthralgias.  Skin: Negative for rash and wound.  Neurological: Negative for dizziness, weakness and headaches.  Hematological: Negative for adenopathy. Does not bruise/bleed easily.  Psychiatric/Behavioral: Negative for sleep disturbance and decreased concentration.   Past Medical History  Diagnosis Date  . DIAB W/O COMP TYPE II/UNS NOT STATED UNCNTRL 04/06/2009  . ANXIETY 03/17/2007  . ASTHMA 06/26/2007  . GERD 06/26/2007  . CONSTIPATION, CHRONIC 02/27/2010  . Irritable bowel syndrome 02/27/2010  . UNSPEC POLYARTHROPATHY/POLYARTHRIT MX SITES 12/13/2009  . SEIZURE DISORDER 04/17/2010    History   Social History  . Marital Status: Legally Separated    Spouse Name: N/A    Number of Children: N/A  . Years of Education: N/A   Occupational History  . Not on file.   Social History Main Topics  . Smoking status: Never Smoker   . Smokeless  tobacco: Not on file  . Alcohol Use: Not on file  . Drug Use: Not on file  . Sexually Active: Not on file   Other Topics Concern  . Not on file   Social History Narrative  . No narrative on file    No past surgical history on file.  No family history on file.  Allergies  Allergen Reactions  . Iodine     Current Outpatient Prescriptions on File Prior to Visit  Medication Sig Dispense Refill  . AMITIZA 8 MCG capsule TAKE ONE CAPSULE BY MOUTH TWICE A DAY  60 capsule  1  . budesonide-formoterol (SYMBICORT) 80-4.5 MCG/ACT inhaler Inhale 2 puffs into the lungs 2 (two) times daily.        Marland Kitchen glucose blood (ONE TOUCH ULTRA TEST) test strip PRN  100 each  5  . Lancets (ONETOUCH ULTRASOFT) lancets Use as instructed  100 each  12  . omeprazole (PRILOSEC) 20 MG capsule Take 20 mg by mouth daily. As neede       . Saxagliptin-Metformin (KOMBIGLYZE XR) 5-500 MG TB24 Take 1 tablet by mouth daily.  30 tablet  11  . zolpidem (AMBIEN) 10 MG tablet Take 1 tablet (10 mg total) by mouth at bedtime as needed.  30 tablet  5    BP 136/82  Pulse 72  Temp 98.1 F (36.7 C)  Resp 16  Ht 5\' 5"  (1.651 m)  Wt 164 lb (74.39 kg)  BMI 27.29 kg/m2       Objective:   Physical Exam  Nursing note and vitals reviewed. Constitutional: She is oriented to person, place,  and time. She appears well-developed and well-nourished. No distress.  HENT:  Head: Normocephalic and atraumatic.  Right Ear: External ear normal.  Left Ear: External ear normal.  Nose: Nose normal.  Mouth/Throat: Oropharynx is clear and moist.  Eyes: Conjunctivae and EOM are normal. Pupils are equal, round, and reactive to light.  Neck: Normal range of motion. Neck supple. No JVD present. No tracheal deviation present. No thyromegaly present.  Cardiovascular: Normal rate, regular rhythm, normal heart sounds and intact distal pulses.   No murmur heard. Pulmonary/Chest: Effort normal and breath sounds normal. She has no wheezes. She  exhibits no tenderness.  Abdominal: Soft. Bowel sounds are normal.  Musculoskeletal: Normal range of motion. She exhibits no edema and no tenderness.  Lymphadenopathy:    She has no cervical adenopathy.  Neurological: She is alert and oriented to person, place, and time. She has normal reflexes. No cranial nerve deficit.  Skin: Skin is warm and dry. She is not diaphoretic.  Psychiatric: She has a normal mood and affect. Her behavior is normal.          Assessment & Plan:  Patient's weight is stable I suspect her A1c will be better that she believes it to be.  Her blood pressure is excellent GERD and asthma are stable we'll measure hemoglobin A1c and a basic metabolic today Her seizure disorder stable

## 2011-10-04 LAB — BASIC METABOLIC PANEL
BUN: 8 mg/dL (ref 6–23)
Chloride: 105 mEq/L (ref 96–112)
Creatinine, Ser: 0.5 mg/dL (ref 0.4–1.2)

## 2011-10-19 ENCOUNTER — Telehealth: Payer: Self-pay

## 2011-10-19 MED ORDER — AZITHROMYCIN 250 MG PO TABS: ORAL_TABLET | ORAL | Status: DC

## 2011-10-19 NOTE — Telephone Encounter (Signed)
Make have z pack per dr Lovell Sheehan

## 2011-10-19 NOTE — Telephone Encounter (Signed)
Patient states she feels as she has a sinus infection. Patient states she has head congestion, headache, teeth hurting and pressure in eyes. Symptoms started on Sunday. Patient has been treating with Zyrtec and Sudafed. Patient feels its not getting any better and would [facility] [address]. Pts call back [phone]. Please advise?

## 2011-10-19 NOTE — Telephone Encounter (Signed)
RX sent and patient informed.  

## 2011-11-23 ENCOUNTER — Ambulatory Visit (INDEPENDENT_AMBULATORY_CARE_PROVIDER_SITE_OTHER): Payer: BC Managed Care – PPO | Admitting: Family

## 2011-11-23 ENCOUNTER — Encounter: Payer: Self-pay | Admitting: Family

## 2011-11-23 VITALS — BP 136/78 | Temp 98.8°F | Wt 167.0 lb

## 2011-11-23 DIAGNOSIS — B354 Tinea corporis: Secondary | ICD-10-CM

## 2011-11-23 DIAGNOSIS — E119 Type 2 diabetes mellitus without complications: Secondary | ICD-10-CM

## 2011-11-23 MED ORDER — CLOTRIMAZOLE-BETAMETHASONE 1-0.05 % EX CREA: TOPICAL_CREAM | Freq: Two times a day (BID) | CUTANEOUS | Status: DC

## 2011-11-23 NOTE — Progress Notes (Signed)
Subjective:    Patient ID: Denise Beard, female    DOB: 26-Jan-1961, 51 y.o.   MRN: [id number]  HPI And 51 year old [place] American female, patient of Dr. Lovell Sheehan, is in with complaints of a rash on her right foot x2 months. The rash as itchy, dry, flaky, and spreading. She's been applying Vaseline to the area with no relief. She has a history of type 2 diabetes. Her blood sugars have run slightly higher in the 160s than usual.   Review of Systems  Constitutional: Negative.   Respiratory: Negative.   Cardiovascular: Negative.   Skin: Positive for rash.  Neurological: Negative.   Psychiatric/Behavioral: Negative.    Past Medical History  Diagnosis Date  . DIAB W/O COMP TYPE II/UNS NOT STATED UNCNTRL 04/06/2009  . ANXIETY 03/17/2007  . ASTHMA 06/26/2007  . GERD 06/26/2007  . CONSTIPATION, CHRONIC 02/27/2010  . Irritable bowel syndrome 02/27/2010  . UNSPEC POLYARTHROPATHY/POLYARTHRIT MX SITES 12/13/2009  . SEIZURE DISORDER 04/17/2010    History   Social History  . Marital Status: Legally Separated    Spouse Name: N/A    Number of Children: N/A  . Years of Education: N/A   Occupational History  . Not on file.   Social History Main Topics  . Smoking status: Never Smoker   . Smokeless tobacco: Not on file  . Alcohol Use: Not on file  . Drug Use: Not on file  . Sexually Active: Not on file   Other Topics Concern  . Not on file   Social History Narrative  . No narrative on file    No past surgical history on file.  No family history on file.  Allergies  Allergen Reactions  . Iodine     Current Outpatient Prescriptions on File Prior to Visit  Medication Sig Dispense Refill  . AMITIZA 8 MCG capsule TAKE ONE CAPSULE BY MOUTH TWICE A DAY  60 capsule  1  . budesonide-formoterol (SYMBICORT) 80-4.5 MCG/ACT inhaler Inhale 2 puffs into the lungs 2 (two) times daily.        Marland Kitchen glucose blood (ONE TOUCH ULTRA TEST) test strip PRN  100 each  5  . Lancets (ONETOUCH  ULTRASOFT) lancets Use as instructed  100 each  12  . omeprazole (PRILOSEC) 20 MG capsule Take 20 mg by mouth daily. As neede       . Saxagliptin-Metformin (KOMBIGLYZE XR) 5-500 MG TB24 Take 1 tablet by mouth daily.  30 tablet  11  . zolpidem (AMBIEN) 10 MG tablet Take 1 tablet (10 mg total) by mouth at bedtime as needed.  30 tablet  5  . azithromycin (ZITHROMAX) 250 MG tablet Take 2 tabs today and 1 each day X 4 days  6 each  0    BP 136/78  Temp 98.8 F (37.1 C) (Oral)  Wt 167 lb (75.751 kg)chart    Objective:   Physical Exam  Constitutional: She is oriented to person, place, and time. She appears well-developed and well-nourished.  Neck: Normal range of motion. Neck supple.  Cardiovascular: Normal rate, regular rhythm and normal heart sounds.   Pulmonary/Chest: Effort normal and breath sounds normal.  Musculoskeletal: Normal range of motion.  Neurological: She is alert and oriented to person, place, and time.  Skin: Skin is warm and dry.       Rash to the medial right foot. Dry, flaky, and darkly pigmented.   Psychiatric: She has a normal mood and affect.          Assessment &  Plan:  Assessment: Tinea Corporis, Type 2 diabetes  Plan: Lotrisone cream applied to the AA BID. Call the office if symptoms worsen or persist. Recheck as scheduled and sooner as needed.

## 2011-11-23 NOTE — Patient Instructions (Addendum)
Yeast Infection of the Skin  Some yeast on the skin is normal, but sometimes it causes an infection. If you have a yeast infection, it shows up as white or light brown patches on brown skin. You can see it better in the summer on tan skin. It causes light-colored holes in your suntan. It can happen on any area of the body. This cannot be passed from person to person.  HOME CARE   Scrub your skin daily with a dandruff shampoo. Your rash may take a couple weeks to get well.    Do not scratch or itch the rash.   GET HELP RIGHT AWAY IF:     You get another infection from scratching. The skin may get warm, red, and may ooze fluid.    The infection does not seem to be getting better.   MAKE SURE YOU:   Understand these instructions.    Will watch your condition.    Will get help right away if you are not doing well or get worse.   Document Released: 04/19/2008 Document Revised: 04/26/2011 Document Reviewed: 04/19/2008  [employer] Patient Information 2012 [employer].

## 2012-01-04 ENCOUNTER — Ambulatory Visit: Payer: BC Managed Care – PPO | Admitting: Internal Medicine

## 2012-01-17 ENCOUNTER — Telehealth: Payer: Self-pay | Admitting: Internal Medicine

## 2012-01-17 NOTE — Telephone Encounter (Signed)
Caller: [name]/Patient; Patient Name: Denise Beard; PCP: Darryll Capers (Adults only); Best Callback Phone Number: [phone]; Reason for call: Other; Is having issue with control with blood sugar levels running 210-260 with a spike of 375  earlier this week with onset of these levels since May 2013; BS at 16:30 today 01/17/12 was 225; this morning at 10:50 - 225; Currently is also experiencing dizziness and blurred vision; Triaged using Diabetes: control problems with with a disposition to see provider within 4 hours. Elevated disposition per nursing judgement to see ED. Care advice given. Also having  issue with yeast infection in skin of right foot and has run out of cream and is requesting refill or a change to treat.  OFFICE: PLEASE FOLLOW UP ON UNRESOLVED YEAST INFECTION OF RIGHT FOOT. INITIALLY TREATED WITH LOTRISONE PRESCRIBED BY DR. Orvan Falconer ON 11/23/11.  THANK YOU.

## 2012-01-18 MED ORDER — CLOTRIMAZOLE-BETAMETHASONE 1-0.05 % EX CREA: TOPICAL_CREAM | Freq: Two times a day (BID) | CUTANEOUS | Status: DC

## 2012-01-18 NOTE — Telephone Encounter (Signed)
Rx sent 

## 2012-01-23 ENCOUNTER — Other Ambulatory Visit: Payer: Self-pay | Admitting: Family

## 2012-02-11 ENCOUNTER — Other Ambulatory Visit: Payer: Self-pay | Admitting: *Deleted

## 2012-02-11 MED ORDER — ZOLPIDEM TARTRATE 10 MG PO TABS: 10.0000 mg | ORAL_TABLET | Freq: Every evening | ORAL | Status: DC | PRN

## 2012-02-18 ENCOUNTER — Encounter: Payer: Self-pay | Admitting: Internal Medicine

## 2012-02-18 ENCOUNTER — Ambulatory Visit (INDEPENDENT_AMBULATORY_CARE_PROVIDER_SITE_OTHER): Payer: BC Managed Care – PPO | Admitting: Internal Medicine

## 2012-02-18 VITALS — BP 134/82 | HR 76 | Temp 99.3°F | Resp 16 | Ht 65.0 in | Wt 164.0 lb

## 2012-02-18 DIAGNOSIS — E119 Type 2 diabetes mellitus without complications: Secondary | ICD-10-CM

## 2012-02-18 DIAGNOSIS — IMO0002 Reserved for concepts with insufficient information to code with codable children: Secondary | ICD-10-CM

## 2012-02-18 DIAGNOSIS — E1165 Type 2 diabetes mellitus with hyperglycemia: Secondary | ICD-10-CM

## 2012-02-18 DIAGNOSIS — B369 Superficial mycosis, unspecified: Secondary | ICD-10-CM

## 2012-02-18 MED ORDER — SAXAGLIPTIN-METFORMIN ER 2.5-1000 MG PO TB24: 1.0000 | ORAL_TABLET | Freq: Every day | ORAL | Status: DC

## 2012-02-18 MED ORDER — FLUCONAZOLE 100 MG PO TABS: 100.0000 mg | ORAL_TABLET | Freq: Every day | ORAL | Status: DC

## 2012-02-18 NOTE — Patient Instructions (Addendum)
I have referred you to diabetic teaching You will take Diflucan daily for the next 21 days for the fungus on the feet.  We are changing her diabetic medicine up one dose to 2.09/998 dose

## 2012-02-18 NOTE — Progress Notes (Signed)
Subjective:    Patient ID: Denise Beard, female    DOB: 02-May-1961, 51 y.o.   MRN: [id number]  HPI Patient is a 51 year old female with brittle diabetes asthma and GERD.  She is currently on, a combination of metformin and another medication.  She has widely varying blood sugars and had suffered from some hypoglycemia his.  Her last A1c was 6.6 and she is due in A1c today.  She is requested and we agree with referral for diabetic teaching to help her learn how to use it out her diet so she can even out her blood sugars and I have such wide swings   Review of Systems  Constitutional: Positive for fatigue. Negative for activity change and appetite change.  HENT: Positive for sneezing. Negative for ear pain, congestion, neck pain, postnasal drip and sinus pressure.   Eyes: Negative for redness and visual disturbance.  Respiratory: Negative for cough, shortness of breath and wheezing.   Gastrointestinal: Negative for abdominal pain and abdominal distention.  Genitourinary: Positive for urgency and frequency. Negative for dysuria and menstrual problem.  Musculoskeletal: Negative for myalgias, joint swelling and arthralgias.  Skin: Positive for color change and rash. Negative for wound.  Neurological: Positive for weakness. Negative for dizziness and headaches.  Hematological: Negative for adenopathy. Does not bruise/bleed easily.  Psychiatric/Behavioral: Negative for disturbed wake/sleep cycle and decreased concentration.   Past Medical History  Diagnosis Date  . DIAB W/O COMP TYPE II/UNS NOT STATED UNCNTRL 04/06/2009  . ANXIETY 03/17/2007  . ASTHMA 06/26/2007  . GERD 06/26/2007  . CONSTIPATION, CHRONIC 02/27/2010  . Irritable bowel syndrome 02/27/2010  . UNSPEC POLYARTHROPATHY/POLYARTHRIT MX SITES 12/13/2009  . SEIZURE DISORDER 04/17/2010    History   Social History  . Marital Status: Legally Separated    Spouse Name: N/A    Number of Children: N/A  . Years of Education: N/A     Occupational History  . Not on file.   Social History Main Topics  . Smoking status: Never Smoker   . Smokeless tobacco: Not on file  . Alcohol Use: Not on file  . Drug Use: Not on file  . Sexually Active: Not on file   Other Topics Concern  . Not on file   Social History Narrative  . No narrative on file    No past surgical history on file.  No family history on file.  Allergies  Allergen Reactions  . Iodine     Current Outpatient Prescriptions on File Prior to Visit  Medication Sig Dispense Refill  . AMITIZA 8 MCG capsule TAKE ONE CAPSULE BY MOUTH TWICE A DAY  60 capsule  1  . azithromycin (ZITHROMAX) 250 MG tablet Take 2 tabs today and 1 each day X 4 days  6 each  0  . budesonide-formoterol (SYMBICORT) 80-4.5 MCG/ACT inhaler Inhale 2 puffs into the lungs 2 (two) times daily.        . clotrimazole-betamethasone (LOTRISONE) cream Apply topically 2 (two) times daily.  30 g  0  . clotrimazole-betamethasone (LOTRISONE) cream APPLY TOPICALLY 2 (TWO) TIMES DAILY.  30 g  0  . glucose blood (ONE TOUCH ULTRA TEST) test strip PRN  100 each  5  . Lancets (ONETOUCH ULTRASOFT) lancets Use as instructed  100 each  12  . omeprazole (PRILOSEC) 20 MG capsule Take 20 mg by mouth daily. As neede       . zolpidem (AMBIEN) 10 MG tablet Take 1 tablet (10 mg total) by mouth at bedtime as  needed.  30 tablet  0  . DISCONTD: Saxagliptin-Metformin (KOMBIGLYZE XR) 5-500 MG TB24 Take 1 tablet by mouth daily.  30 tablet  11    BP 134/82  Pulse 76  Temp 99.3 F (37.4 C)  Resp 16  Ht 5\' 5"  (1.651 m)  Wt 164 lb (74.39 kg)  BMI 27.29 kg/m2       Objective:   Physical Exam  Nursing note and vitals reviewed. Constitutional: She is oriented to person, place, and time. She appears well-developed and well-nourished. No distress.  HENT:  Head: Normocephalic and atraumatic.  Right Ear: External ear normal.  Left Ear: External ear normal.  Nose: Nose normal.  Mouth/Throat: Oropharynx is  clear and moist.  Eyes: Conjunctivae normal and EOM are normal. Pupils are equal, round, and reactive to light.  Neck: Normal range of motion. Neck supple. No JVD present. No tracheal deviation present. No thyromegaly present.  Cardiovascular: Normal rate, regular rhythm, normal heart sounds and intact distal pulses.   No murmur heard. Pulmonary/Chest: Effort normal and breath sounds normal. She has no wheezes. She exhibits no tenderness.  Abdominal: Soft. Bowel sounds are normal.  Musculoskeletal: Normal range of motion. She exhibits no edema and no tenderness.  Lymphadenopathy:    She has no cervical adenopathy.  Neurological: She is alert and oriented to person, place, and time. She has normal reflexes. No cranial nerve deficit.  Skin: Rash noted. She is not diaphoretic. There is erythema.  Psychiatric: She has a normal mood and affect. Her behavior is normal.          Assessment & Plan:  We'll increase the, gauze to the 2.5 1000 metformin we'll get a basic metabolic panel and a hemoglobin A1c today and we have made a referral to diabetic teaching Have given her Diflucan 100 mg by mouth daily for 21 days 2 to the fungal rash on her feet

## 2012-02-19 LAB — BASIC METABOLIC PANEL
CO2: 23 mEq/L (ref 19–32)
Calcium: 9.2 mg/dL (ref 8.4–10.5)
Creatinine, Ser: 0.7 mg/dL (ref 0.4–1.2)
Glucose, Bld: 196 mg/dL — ABNORMAL HIGH (ref 70–99)

## 2012-03-09 ENCOUNTER — Other Ambulatory Visit: Payer: Self-pay | Admitting: Internal Medicine

## 2012-03-22 ENCOUNTER — Ambulatory Visit: Payer: BC Managed Care – PPO

## 2012-03-25 ENCOUNTER — Ambulatory Visit: Payer: BC Managed Care – PPO

## 2012-04-08 ENCOUNTER — Encounter: Payer: BC Managed Care – PPO | Attending: Internal Medicine | Admitting: *Deleted

## 2012-04-08 VITALS — Ht 66.0 in | Wt 165.0 lb

## 2012-04-08 DIAGNOSIS — E119 Type 2 diabetes mellitus without complications: Secondary | ICD-10-CM

## 2012-04-08 DIAGNOSIS — Z713 Dietary counseling and surveillance: Secondary | ICD-10-CM | POA: Insufficient documentation

## 2012-04-09 ENCOUNTER — Other Ambulatory Visit: Payer: Self-pay | Admitting: Internal Medicine

## 2012-04-15 ENCOUNTER — Encounter: Payer: Self-pay | Admitting: *Deleted

## 2012-04-15 ENCOUNTER — Ambulatory Visit: Payer: BC Managed Care – PPO

## 2012-04-15 NOTE — Progress Notes (Signed)
  Patient was seen on 04/08/2012 for the first of a series of three diabetes self-management courses at the [facility]. Current A1c on 02/18/12 is 7.9% The following learning objectives were met by the patient during this course:   Defines the role of glucose and insulin  Identifies type of diabetes and pathophysiology  Defines the diagnostic criteria for diabetes and prediabetes  States the risk factors for Type 2 Diabetes  States the symptoms of Type 2 Diabetes  Defines Type 2 Diabetes treatment goals  Defines Type 2 Diabetes treatment options  States the rationale for glucose monitoring  Identifies A1C, glucose targets, and testing times  Identifies proper sharps disposal  Defines the purpose of a diabetes food plan  Identifies carbohydrate food groups  Defines effects of carbohydrate foods on glucose levels  Identifies carbohydrate choices/grams/food labels  States benefits of physical activity and effect on glucose  Review of suggested activity guidelines  Handouts given during class include:  Type 2 Diabetes: Basics Book  My Food Plan Book  Food and Activity Log  Follow-Up Plan: Core Class 2

## 2012-04-28 ENCOUNTER — Ambulatory Visit: Payer: BC Managed Care – PPO | Admitting: Internal Medicine

## 2012-04-29 ENCOUNTER — Ambulatory Visit: Payer: BC Managed Care – PPO

## 2012-06-02 ENCOUNTER — Other Ambulatory Visit: Payer: Self-pay | Admitting: Internal Medicine

## 2012-06-10 ENCOUNTER — Encounter: Payer: BC Managed Care – PPO | Attending: Internal Medicine | Admitting: *Deleted

## 2012-06-10 DIAGNOSIS — Z713 Dietary counseling and surveillance: Secondary | ICD-10-CM | POA: Insufficient documentation

## 2012-06-10 DIAGNOSIS — E119 Type 2 diabetes mellitus without complications: Secondary | ICD-10-CM | POA: Insufficient documentation

## 2012-06-11 NOTE — Progress Notes (Signed)
  Patient was seen on 06/10/12 for the second of a series of three diabetes self-management courses at the [facility]. The following learning objectives were met by the patient during this course:   Explain basic nutrition maintenance and quality assurance  Describe causes, symptoms and treatment of hypoglycemia and hyperglycemia  Explain how to manage diabetes during illness  Describe the importance of good nutrition for health and healthy eating strategies  List strategies to follow meal plan when dining out  Describe the effects of alcohol on glucose and how to use it safely  Describe problem solving skills for day-to-day glucose challenges  Describe strategies to use when treatment plan needs to change  Identify important factors involved in successful weight loss  Describe ways to remain physically active  Describe the impact of regular activity on insulin resistance    Handouts given in class:  Refrigerator magnet for Sick Day Guidelines  [facility] Oral medication/insulin handout  Follow-Up Plan: Patient will attend the final class of the ADA Diabetes Self-Care Education.   

## 2012-06-11 NOTE — Patient Instructions (Signed)
Goals:  Follow Diabetes Meal Plan as instructed  Eat 3 meals and 2 snacks, every 3-5 hrs  Limit carbohydrate intake to 30-45 grams carbohydrate/meal  Limit carbohydrate intake to 15 grams carbohydrate/snack  Add lean protein foods to meals/snacks  Monitor glucose levels as instructed by your doctor  Aim for 30 mins of physical activity daily  Bring food record and glucose log to your next nutrition visit 

## 2012-06-24 ENCOUNTER — Encounter: Payer: BC Managed Care – PPO | Attending: Internal Medicine | Admitting: Dietician

## 2012-06-24 DIAGNOSIS — Z713 Dietary counseling and surveillance: Secondary | ICD-10-CM | POA: Insufficient documentation

## 2012-06-24 DIAGNOSIS — E119 Type 2 diabetes mellitus without complications: Secondary | ICD-10-CM

## 2012-06-25 NOTE — Progress Notes (Signed)
  Patient was seen on 06/24/2012 for the third of a series of three diabetes self-management courses at the [facility]. The following learning objectives were met by the patient during this course:    Describe how diabetes changes over time   Identify diabetes complications and ways to prevent them   Describe strategies that can promote heart health including lowering blood pressure and cholesterol   Describe strategies to lower dietary fat and sodium in the diet   Identify physical activities that benefit cardiovascular health   Evaluate success in meeting personal goal   Describe the belief that they can live successfully with diabetes day to day   Establish 2-3 goals that they will plan to diligently work on until they return for the free 2-month follow-up visit  Summary:Ht:  66 in  WT: (04/08/12) 165 lb  A1C (02/18/12)= 7.9% The following handouts were given in class:  3 Month Follow Up Visit handout  Goal setting handout  Class evaluation form  Stress Management Handout  Low Sodium Flavoring Tips  Cholesterol & Triglycerides  Your patient has established the following 3 month goals for diabetes self-care:  Count Carbohydrates at most of my meals and snacks  Increase my activity at least 3 days a week.  Take my diabetes medications as scheduled.  Follow-Up Plan: Patient will attend a 3 month follow-up visit for diabetes self-management education.

## 2012-06-30 ENCOUNTER — Encounter: Payer: Self-pay | Admitting: Internal Medicine

## 2012-06-30 ENCOUNTER — Ambulatory Visit (INDEPENDENT_AMBULATORY_CARE_PROVIDER_SITE_OTHER): Payer: BC Managed Care – PPO | Admitting: Internal Medicine

## 2012-06-30 VITALS — BP 140/90 | HR 76 | Temp 98.2°F | Resp 16 | Ht 72.0 in | Wt 164.0 lb

## 2012-06-30 DIAGNOSIS — B369 Superficial mycosis, unspecified: Secondary | ICD-10-CM

## 2012-06-30 DIAGNOSIS — IMO0002 Reserved for concepts with insufficient information to code with codable children: Secondary | ICD-10-CM

## 2012-06-30 DIAGNOSIS — E1169 Type 2 diabetes mellitus with other specified complication: Secondary | ICD-10-CM

## 2012-06-30 DIAGNOSIS — IMO0001 Reserved for inherently not codable concepts without codable children: Secondary | ICD-10-CM

## 2012-06-30 DIAGNOSIS — E1165 Type 2 diabetes mellitus with hyperglycemia: Secondary | ICD-10-CM

## 2012-06-30 MED ORDER — CLOTRIMAZOLE-BETAMETHASONE 1-0.05 % EX CREA: TOPICAL_CREAM | Freq: Two times a day (BID) | CUTANEOUS | Status: DC

## 2012-06-30 MED ORDER — FLUCONAZOLE 100 MG PO TABS: 100.0000 mg | ORAL_TABLET | Freq: Every day | ORAL | Status: DC

## 2012-06-30 NOTE — Progress Notes (Signed)
Subjective:    Patient ID: Denise Beard, female    DOB: 1960-06-30, 52 y.o.   MRN: [id number]  HPI  Has been to DM classes and has noted high CBGs and last A1c 7.9 Will increase the  kombiglyze to 09/998 and refer to endocrine She has a dark rask on the dorsum of her left foot that has spread  Review of Systems  Constitutional: Negative for activity change, appetite change and fatigue.  HENT: Negative for ear pain, congestion, neck pain, postnasal drip and sinus pressure.   Eyes: Negative for redness and visual disturbance.  Respiratory: Negative for cough, shortness of breath and wheezing.   Gastrointestinal: Negative for abdominal pain and abdominal distention.  Genitourinary: Negative for dysuria, frequency and menstrual problem.  Musculoskeletal: Negative for myalgias, joint swelling and arthralgias.  Skin: Negative for rash and wound.  Neurological: Negative for dizziness, weakness and headaches.  Hematological: Negative for adenopathy. Does not bruise/bleed easily.  Psychiatric/Behavioral: Negative for sleep disturbance and decreased concentration.   Past Medical History  Diagnosis Date  . DIAB W/O COMP TYPE II/UNS NOT STATED UNCNTRL 04/06/2009  . ANXIETY 03/17/2007  . ASTHMA 06/26/2007  . GERD 06/26/2007  . CONSTIPATION, CHRONIC 02/27/2010  . Irritable bowel syndrome 02/27/2010  . UNSPEC POLYARTHROPATHY/POLYARTHRIT MX SITES 12/13/2009  . SEIZURE DISORDER 04/17/2010    History   Social History  . Marital Status: Legally Separated    Spouse Name: N/A    Number of Children: N/A  . Years of Education: N/A   Occupational History  . Not on file.   Social History Main Topics  . Smoking status: Never Smoker   . Smokeless tobacco: Not on file  . Alcohol Use: Not on file  . Drug Use: Not on file  . Sexually Active: Not on file   Other Topics Concern  . Not on file   Social History Narrative  . No narrative on file    Past Surgical History  Procedure Laterality  Date  . Cesarean section    . Tubal ligation    . Abdominal hysterectomy      No family history on file.  Allergies  Allergen Reactions  . Iodine     Current Outpatient Prescriptions on File Prior to Visit  Medication Sig Dispense Refill  . AMITIZA 8 MCG capsule TAKE ONE CAPSULE BY MOUTH TWICE A DAY  60 capsule  1  . budesonide-formoterol (SYMBICORT) 80-4.5 MCG/ACT inhaler Inhale 2 puffs into the lungs 2 (two) times daily.        Marland Kitchen omeprazole (PRILOSEC) 20 MG capsule Take 20 mg by mouth daily. As neede       . ONE TOUCH ULTRA TEST test strip USE AS DIRECTED  100 each  6  . Saxagliptin-Metformin (KOMBIGLYZE XR) 2.09-998 MG TB24 Take 1 tablet by mouth daily.  30 tablet  5  . zolpidem (AMBIEN) 10 MG tablet TAKE 1 TABLET AT BEDTIME AS NEEDED FOR SLEEP  30 tablet  2   No current facility-administered medications on file prior to visit.    BP 140/90  Pulse 76  Temp(Src) 98.2 F (36.8 C)  Resp 16  Ht 6' (1.829 m)  Wt 164 lb (74.39 kg)  BMI 22.24 kg/m2       Objective:   Physical Exam  Constitutional: She appears well-developed and well-nourished.  HENT:  Head: Atraumatic.  Cardiovascular: Normal rate and regular rhythm.   Pulmonary/Chest: Effort normal and breath sounds normal.  Assessment & Plan:  Poorly controlled DM Increased kombiglyze and refer to Endocrine Check a1c reviewed DM teaching treate fungal rash with topical and diflucan for 14 days

## 2012-07-07 ENCOUNTER — Ambulatory Visit: Payer: BC Managed Care – PPO | Admitting: Internal Medicine

## 2012-07-15 ENCOUNTER — Ambulatory Visit (INDEPENDENT_AMBULATORY_CARE_PROVIDER_SITE_OTHER): Payer: BC Managed Care – PPO | Admitting: Internal Medicine

## 2012-07-15 ENCOUNTER — Encounter: Payer: Self-pay | Admitting: Internal Medicine

## 2012-07-15 VITALS — BP 144/92 | HR 77 | Temp 98.5°F | Resp 16 | Ht 65.0 in | Wt 162.0 lb

## 2012-07-15 DIAGNOSIS — E119 Type 2 diabetes mellitus without complications: Secondary | ICD-10-CM

## 2012-07-15 MED ORDER — METFORMIN HCL 500 MG PO TABS: 1000.0000 mg | ORAL_TABLET | Freq: Every day | ORAL | Status: DC

## 2012-07-15 NOTE — Progress Notes (Signed)
Subjective:     Patient ID: Denise Beard, female   DOB: 1960/10/01, 52 y.o.   MRN: [id number]  HPI Denise Beard is a pleasant 52 year old woman, referred by her PCP, Dr. Lovell Sheehan, for management of DM 2, non-insulin-dependent, uncontrolled, without complications.  Patient has had diabetes since 2012. She had prediabetes from 2010. Her last hemoglobin A1c was 8.1% (06/30/2012), previously 7.9% (02/18/2012). She is on: saxagliptin/metformin XR 09/998 mg (Kombyglyze), increased from 2.09/998 at last visit with PCP 2 weeks ago. Before this, she was on Onglynza, prior to this on Januvia. She can tolerate these medications well.   She has not been checking sugars daily: - am: 200s She has episodes when she gets dizzy and confused, but she is not low because she checks and she is in the 200s-300s when checked. Highest sugars 424 >> 399 in 04/19/2012. Average 240. Lowest sugars: 155. She believes she has hypoglycemia awareness. She has a Clinical research associate.   She had a nutrition class with [employer] (nutrition). Diet: - b'fast - if she eats: bisquit from fast food restaurant - 10 am: snack from vending machine - lunch - if she eats: PBJ, hamburger - dinner: [employer]: country style steak + mashed potato and gravy - snacks: chips, apple sauce - 1 snack a day  Last eye exam 2012; no CKD; no neuropathy - but lately noticed a burning in her feet R>L.   Last BMP was normal, except glucose 196 (02/18/2012). Her last set of lipids from 02/26/2011 was: 231/108/43/169.  I reviewed her chart including office notes, telephone notes, labs, and available scanned records. She also has a history of a rash the medial side of her R foot of her left foot, treated with Diflucan; anxiety; asthma; GERD; constipation; polyarthropathy; seizure disorder.  She has family history of diabetes in her grandfather.   Review of Systems Constitutional: no weight gain/loss, no fatigue, no subjective hyperthermia/hypothermia, poor  sleep Eyes: plus blurry vision, no xerophthalmia ENT: no sore throat, no nodules palpated in throat, no dysphagia/odynophagia, no hoarseness Cardiovascular: no CP/SOB/palpitations/leg swelling Respiratory: no cough/plus herSOB Gastrointestinal: no N/V/D/+ C, + GERD Musculoskeletal: no muscle/joint aches Skin:  Macular rash on the right medium foot, with dark discoloration, skating, and itching, approximately 2 x 10 cm; and also a similar rash 2 x 2 centimeters on the lateral left foot  Neurological: no tremors/numbness/tingling/dizziness, HA Psychiatric: no depression/anxiety  Past Medical History  Diagnosis Date  . DIAB W/O COMP TYPE II/UNS NOT STATED UNCNTRL 04/06/2009  . ANXIETY 03/17/2007  . ASTHMA 06/26/2007  . GERD 06/26/2007  . CONSTIPATION, CHRONIC 02/27/2010  . Irritable bowel syndrome 02/27/2010  . UNSPEC POLYARTHROPATHY/POLYARTHRIT MX SITES 12/13/2009  . SEIZURE DISORDER 04/17/2010   Past Surgical History  Procedure Laterality Date  . Cesarean section    . Tubal ligation    . Abdominal hysterectomy     History   Social History  . Marital Status: Legally Separated    Spouse Name: N/A    Number of Children: 2: 19 and 22   . Years of Education: N/A   Occupational History  . Not on file.   Social History Main Topics  . Smoking status: Never Smoker   . Smokeless tobacco: No  . Alcohol Use: No  . Drug Use: No  . Sexually Active: No   Current Outpatient Prescriptions on File Prior to Visit  Medication Sig Dispense Refill  . AMITIZA 8 MCG capsule TAKE ONE CAPSULE BY MOUTH TWICE A DAY  60  capsule  1  . budesonide-formoterol (SYMBICORT) 80-4.5 MCG/ACT inhaler Inhale 2 puffs into the lungs 2 (two) times daily.        . clotrimazole-betamethasone (LOTRISONE) cream Apply topically 2 (two) times daily.  30 g  0  . fluconazole (DIFLUCAN) 100 MG tablet Take 1 tablet (100 mg total) by mouth daily.  21 tablet  0  . omeprazole (PRILOSEC) 20 MG capsule Take 20 mg by mouth daily.  As neede       . ONE TOUCH ULTRA TEST test strip USE AS DIRECTED  100 each  6  . Saxagliptin-Metformin (KOMBIGLYZE XR) 2.09-998 MG TB24 Take 1 tablet by mouth daily.  30 tablet  5  . zolpidem (AMBIEN) 10 MG tablet TAKE 1 TABLET AT BEDTIME AS NEEDED FOR SLEEP  30 tablet  2   No current facility-administered medications on file prior to visit.    Allergies  Allergen Reactions  . Iodine    No family history on file. Objective:   Physical Exam There were no vitals taken for this visit. Wt Readings from Last 3 Encounters:  06/30/12 164 lb (74.39 kg)  04/15/12 165 lb (74.844 kg)  02/18/12 164 lb (74.39 kg)   Constitutional: overweight, in NAD Eyes: PERRLA, EOMI, no exophthalmos ENT: moist mucous membranes, no thyromegaly, no cervical lymphadenopathy Cardiovascular: RRR, No MRG Respiratory: CTA B Gastrointestinal: abdomen soft, NT, ND, BS+ Musculoskeletal: no deformities, strength intact in all 4 Skin: moist, warm, no rashes Neurological: no tremor with outstretched hands, DTR normal in all 4  Assessment:     1. DM2, non-insulin-dependent, uncontrolled, without complications    Plan:     We had a long discussion with the patient about her unhealthy diet, and I made the specific suggestions about switching to more healthy alternatives for breakfast lunch and supper. He also gave her a list of more healthy foods (please see patient instructions). I also suggested that she should not skip meals, especially breakfast and lunch, but she might just eat foods or a smoothie as alternatives. I gave her some suggestions for healthy smoothie recipes. - her dose of saxagliptin has recently been increased from 2.5 to 5 mg daily, while metformin is at half target dose of 2000 mg daily - Since she is tolerating the metformin well, I will add 1000 mg of metformin with dinner, starting slowly at 500 mg and then increase to 1000 mg as tolerated - Will continue with the Kombiglyze 09/998 in a.m. -  given sugar log and advised how to fill it and to bring it at next appt - given foot care handout and explained the principles - given instructions for hypoglycemia management "15-15 rule" - I will see her back in 3-4 weeks with her log, and at that time, we'll decide whether we need to add another medication to her regimen. I think she would be a good candidate to either Invokana or bromocriptine. - I advised her to schedule an appointment with her eye doctor - I would check a microalbumin/creatinine ratio today  Office Visit on 07/15/2012  Component Date Value Range Status  . Microalb, Ur 07/15/2012 2.8* 0.0 - 1.9 mg/dL Final  . Creatinine,U 16/02/9603 190.5   Final  . Microalb Creat Ratio 07/15/2012 1.5  0.0 - 30.0 mg/g Final  Letter sent.

## 2012-07-15 NOTE — Patient Instructions (Addendum)
Please return in 3 weeks - 1 month with your sugar log. Please add 500 mg Metformin with dinner, and, in 5 days, if you tolerate this dose well, please increase the dose to 1000 mg. Stay on Kombiglyze in am with breakfast. Try to eat breakfast every day, even if it is just fruit or a smoothie.   Patient instructions for Diabetes mellitus type 2:  DIET AND EXERCISE Diet and exercise is an important part of diabetic treatment.  We recommended aerobic exercise in the form of brisk walking (working between 40-60% of maximal aerobic capacity, similar to brisk walking) for 150 minutes per week (such as 30 minutes five days per week) along with 3 times per week performing 'resistance' training (using various gauge rubber tubes with handles) 5-10 exercises involving the major muscle groups (upper body, lower body and core) performing 10-15 repetitions (or near fatigue) each exercise. Start at half the above goal but build slowly to reach the above goals. If limited by weight, joint pain, or disability, we recommend daily walking in a swimming pool with water up to waist to reduce pressure from joints while allow for adequate exercise.    BLOOD GLUCOSES Monitoring your blood glucoses is important for continued management of your diabetes. Please check your blood glucoses 3-4 times a day: fasting, before meals and at bedtime (you can rotate these measurements - e.g. one day check before the 3 meals, the next day check before 2 of the meals and before bedtime, etc.   HYPOGLYCEMIA (low blood sugar) Hypoglycemia is usually a reaction to not eating, exercising, or taking too much insulin/ other diabetes drugs.  Symptoms include tremors, sweating, hunger, confusion, headache, etc. Treat IMMEDIATELY with 15 grams of Carbs:   4 glucose tablets    cup regular juice/soda   2 tablespoons raisins   4 teaspoons sugar   1 tablespoon honey Recheck blood glucose in 15 mins and repeat above if still symptomatic/blood  glucose <100. Please contact our office at [phone] if you have questions about how to next handle your insulin.  RECOMMENDATIONS TO REDUCE YOUR RISK OF DIABETIC COMPLICATIONS: * Take your prescribed MEDICATION(S). * Follow a DIABETIC diet: Complex carbs, fiber rich foods, heart healthy fish twice weekly, (monounsaturated and polyunsaturated) fats * AVOID saturated/trans fats, high fat foods, >2,300 mg salt per day. * EXERCISE at least 5 times a week for 30 minutes or preferably daily.  * DO NOT SMOKE OR DRINK more than 1 drink a day. * Check your FEET every day. Do not wear tightfitting shoes. Contact us if you develop an ulcer * See your EYE doctor once a year or more if needed * Get a FLU shot once a year * Get a PNEUMONIA vaccine every 5 years and once after age 44 years  GOALS:  * Your Hemoglobin A1c of 7%  * Your Systolic BP should be 130 or lower  * Your Diastolic BP should be 80 or lower  * Your HDL (Good Cholesterol) should be 40 or higher  * Your LDL (Bad Cholesterol) should be 100 or lower  * Your Triglycerides should be 150 or lower  * Your Urine microalbumin (kidney function) of <30 * Your Body Mass Index should be 25 or lower  We will be glad to help you achieve these goals. Our telephone number is: [phone].  Some suggestions for healthy eating: - substitute whole grain for white bread or pasta - substitute brown rice for white rice - substitute 90-calorie flat bread  pieces for slices of bread when possible - substitute sweet potatoes or yams for white potatoes - substitute humus for margarine - substitute tofu for cheese when possible - substitute almond or rice milk for regular milk (would not drink soy milk daily due to concern for soy estrogen influence on breast cancer risk) - substitute dark chocolate for other sweets when possible - substitute water - can add lemon or orange slices for taste - for diet sodas (artificial sweeteners will trick your body  that you can eat sweets without getting calories and will lead you to overeating and weight gain in the long run) - do not skip breakfast or other meals (this will slow down the metabolism and will result in more weight gain over time)  - can try smoothies made from fruit and almond/rice milk in am instead of regular breakfast - can also try old-fashioned (not instant) oatmeal made with almond/rice milk in am - order the dressing on the side when eating salad at a restaurant - eat as little meat as possible - can try juicing, but should not forget that juicing will get rid of the fiber, so would alternate with eating raw veg./fruits or drinking smoothies - use as little oil as possible, even when using olive oil - can dress a salad with a mix of balsamic vinegar and lemon juice, for e.g. - use agave nectar, stevia sugar, or regular sugar rather than artificial sweateners - steam or broil/roast veggies  - snack on veggies/fruit/nuts (unsalted, preferably) when possible, rather than processed foods - reduce or eliminate aspartame in diet (it is in diet sodas, chewing gum, etc) Read the labels!  Ninja blender recipes:  [employer].ee  Buy organic. Replace regular milk with almond or rice milk, regular yoghurt with soy yoghurt, maple syrup with agave nectar.  Consider the glycemic index of foods that you eat or blend (higher glycemic index = higher risk to increase your sugars):  http://www.health.[employer].com.pt.htm

## 2012-07-17 ENCOUNTER — Encounter: Payer: Self-pay | Admitting: Internal Medicine

## 2012-08-11 ENCOUNTER — Telehealth: Payer: Self-pay | Admitting: Internal Medicine

## 2012-08-11 NOTE — Telephone Encounter (Signed)
Pt need a CPX by June want to know if you can work her in or if she can see someone else ?

## 2012-08-11 NOTE — Telephone Encounter (Signed)
May make with someone else

## 2012-08-12 NOTE — Telephone Encounter (Signed)
Pt is sch for cpx on 10/14/12 at 4:15 and fasting labs on 10/06/12 at 8:45.

## 2012-08-15 ENCOUNTER — Ambulatory Visit: Payer: BC Managed Care – PPO | Admitting: Internal Medicine

## 2012-08-29 ENCOUNTER — Ambulatory Visit (INDEPENDENT_AMBULATORY_CARE_PROVIDER_SITE_OTHER): Payer: BC Managed Care – PPO | Admitting: Internal Medicine

## 2012-08-29 ENCOUNTER — Encounter: Payer: Self-pay | Admitting: Internal Medicine

## 2012-08-29 VITALS — BP 108/62 | HR 80 | Temp 98.0°F | Resp 10 | Wt 161.0 lb

## 2012-08-29 DIAGNOSIS — E119 Type 2 diabetes mellitus without complications: Secondary | ICD-10-CM

## 2012-08-29 MED ORDER — SAXAGLIPTIN-METFORMIN ER 5-1000 MG PO TB24: 1.0000 | ORAL_TABLET | Freq: Every day | ORAL | Status: DC

## 2012-08-29 NOTE — Progress Notes (Signed)
Subjective:     Patient ID: Denise Beard, female   DOB: 1960-06-27, 52 y.o.   MRN: [id number]  HPI Denise Beard is a pleasant 52 year old woman, returning for f/u for DM 2, dx 2012, non-insulin-dependent, uncontrolled, without complications.  Her last hemoglobin A1c was 8.1% (06/30/2012), previously 7.9% (02/18/2012). ACR was 1.5 at last visit 1.5 month ago. She is on:  - saxagliptin/metformin XR 2.09/998 mg (Kombyglyze) - this was supposed to be 09/998, but this is what she received from pharmacy - metformin 500 mg with diner, added at last visit - she did not increase to 1000 mg yet (was not sure if she needs to do this)  She has not been checking sugars daily, but she does bring a sugar log with enough data to see that her control has greatly improved, especially in the last 3 weeks. At last visit, her 7 day average was 240. Her sugars now are: - am: 133-179 - before lunch: one value: 179 - bedtime: 109-164 She has a OneTouch Ultra meter and tells me the strips are expensive.   At last visit, we had a long discussion about the fact that she had an unhealthy diet, and I made some specific suggestions about switching to more healthy alternatives for breakfast, lunch and supper. I also gave her a list of more healthy foods, suggested that she should not skip meals, especially breakfast and lunch. She had a nutrition class with [employer] (nutrition) in the past. At this visit, she tells me that she changed her diet: came off sodas, she eats more salads and vegetables in general, she has pie once a week. She also started to walks more: 2 blocks in am and same in pm.  Last eye exam 2012; no CKD; no neuropathy - but lately noticed a burning in her feet R>L.   Last BMP was normal, except glucose 196 (02/18/2012). Her last set of lipids from 02/26/2011 was: 231/108/43/169.  She also has a history of anxiety; asthma; GERD; constipation; polyarthropathy; seizure disorder.   Review of  Systems Constitutional: + weight loss, no fatigue, no subjective hyperthermia/hypothermia Eyes: no blurry vision, no xerophthalmia ENT: no sore throat, no nodules palpated in throat, no dysphagia/odynophagia, no hoarseness Cardiovascular: no CP/SOB/palpitations/leg swelling Respiratory: no cough/SOB Gastrointestinal: no N/V/D/C Musculoskeletal: no muscle/joint aches Skin: no rashes Neurological: no tremors/numbness/tingling/dizziness Psychiatric: no depression/anxiety  I reviewed pt's medications, allergies, PMH, social hx, family hx and no changes required.  Objective:   Physical Exam BP 108/62  Pulse 80  Temp(Src) 98 F (36.7 C) (Oral)  Resp 10  Wt 161 lb (73.029 kg)  BMI 26.79 kg/m2  SpO2 96% Wt Readings from Last 3 Encounters:  08/29/12 161 lb (73.029 kg)  07/15/12 162 lb (73.483 kg)  06/30/12 164 lb (74.39 kg)  Constitutional: normal weight, in NAD Eyes: PERRLA, EOMI, no exophthalmos ENT: moist mucous membranes, no thyromegaly, no cervical lymphadenopathy Cardiovascular: RRR, No MRG Respiratory: CTA B Gastrointestinal: abdomen soft, NT, ND, BS+ Musculoskeletal: no deformities, strength intact in all 4 Skin: moist, warm, no rashes Neurological: no tremor with outstretched hands, DTR normal in all 4  Assessment:     1. DM2, non-insulin-dependent, uncontrolled, without complications    Plan:     Pt with greatly improved CBGs since last visit, due mostly to improved diet and activity. She lost 3 lbs in the last 2 months, which is also good. She has started Metformin at dinnertime, too, but did not advance dose yet to target 1000 mg,  so I advised her to do so. - Also, I sent the correct Kombiglyze to her pharmacy: 09/998 (so increase from 2.09/998) - gave her more sugar logs - I advised her to check with her insurance to see which meter and strips are covered and let me know through MyChart - I will see her back in 3 months with her log - will check a HbA1C then - I  again advised her to schedule an appointment with her eye doctor

## 2012-08-29 NOTE — Patient Instructions (Signed)
I sent a new prescription to the pharmacy for Kombiglyze 09-998. KEEP UP THE GOOD WORK! Please return in 3 months.

## 2012-09-23 ENCOUNTER — Ambulatory Visit: Payer: BC Managed Care – PPO | Admitting: Dietician

## 2012-09-29 ENCOUNTER — Ambulatory Visit (INDEPENDENT_AMBULATORY_CARE_PROVIDER_SITE_OTHER): Payer: BC Managed Care – PPO | Admitting: Internal Medicine

## 2012-09-29 ENCOUNTER — Encounter: Payer: Self-pay | Admitting: Internal Medicine

## 2012-09-29 VITALS — BP 120/80 | HR 72 | Temp 98.3°F | Resp 16 | Ht 65.0 in | Wt 166.0 lb

## 2012-09-29 DIAGNOSIS — K5909 Other constipation: Secondary | ICD-10-CM

## 2012-09-29 DIAGNOSIS — T887XXA Unspecified adverse effect of drug or medicament, initial encounter: Secondary | ICD-10-CM

## 2012-09-29 DIAGNOSIS — J45909 Unspecified asthma, uncomplicated: Secondary | ICD-10-CM

## 2012-09-29 MED ORDER — SITAGLIPTIN PHOSPHATE 100 MG PO TABS: 100.0000 mg | ORAL_TABLET | Freq: Every day | ORAL | Status: DC

## 2012-09-29 MED ORDER — ZOLPIDEM TARTRATE 10 MG PO TABS: ORAL_TABLET | ORAL | Status: DC

## 2012-09-29 NOTE — Patient Instructions (Signed)
Januvia 100 mg to the metformin Increased Symbicort to the 160/4.5 for this month during that week of the allergy season

## 2012-09-29 NOTE — Progress Notes (Signed)
Subjective:    Patient ID: Denise Beard, female    DOB: [birth year], 51 y.o.   MRN: [id number]  Diabetes Pertinent negatives for hypoglycemia include no dizziness or headaches. Pertinent negatives for diabetes include no fatigue and no weakness.  Gastrophageal Reflux She complains of coughing and wheezing. She reports no abdominal pain. Pertinent negatives include no fatigue.   Patient is a 52 year old female who presents for followup of asthmatic bronchitis and adult onset diabetes.  She was placed in the new diabetic medicine in which the co-pay was too expensive and she has not been able to initiate that she is resume the metformin.  She is mild to moderate flare of her asthma and her maintenance drugs not doing as well during the peak of the allergy season.     Review of Systems  Constitutional: Negative for activity change, appetite change and fatigue.  HENT: Negative for ear pain, congestion, neck pain, postnasal drip and sinus pressure.   Eyes: Negative for redness and visual disturbance.  Respiratory: Positive for cough and wheezing. Negative for shortness of breath.   Gastrointestinal: Negative for abdominal pain and abdominal distention.  Genitourinary: Negative for dysuria, frequency and menstrual problem.  Musculoskeletal: Negative for myalgias, joint swelling and arthralgias.  Skin: Negative for rash and wound.  Neurological: Negative for dizziness, weakness and headaches.  Hematological: Negative for adenopathy. Does not bruise/bleed easily.  Psychiatric/Behavioral: Negative for sleep disturbance and decreased concentration.   Past Medical History  Diagnosis Date  . DIAB W/O COMP TYPE II/UNS NOT STATED UNCNTRL 04/06/2009  . ANXIETY 03/17/2007  . ASTHMA 06/26/2007  . GERD 06/26/2007  . CONSTIPATION, CHRONIC 02/27/2010  . Irritable bowel syndrome 02/27/2010  . UNSPEC POLYARTHROPATHY/POLYARTHRIT MX SITES 12/13/2009  . SEIZURE DISORDER 04/17/2010    History    Social History  . Marital Status: Legally Separated    Spouse Name: N/A    Number of Children: N/A  . Years of Education: N/A   Occupational History  . Not on file.   Social History Main Topics  . Smoking status: Never Smoker   . Smokeless tobacco: Not on file  . Alcohol Use: No  . Drug Use: No  . Sexually Active: Not on file   Other Topics Concern  . Not on file   Social History Narrative   Single   2 chldren   Works; Customer service manager for background checks     Past Surgical History  Procedure Laterality Date  . Cesarean section    . Tubal ligation    . Abdominal hysterectomy      Family History  Problem Relation Age of Onset  . Thyroid disease Mother   . Cancer Mother   . Hyperlipidemia Mother   . Cancer Maternal Grandfather     Allergies  Allergen Reactions  . Iodine     Current Outpatient Prescriptions on File Prior to Visit  Medication Sig Dispense Refill  . AMITIZA 8 MCG capsule TAKE ONE CAPSULE BY MOUTH TWICE A DAY  60 capsule  1  . budesonide-formoterol (SYMBICORT) 80-4.5 MCG/ACT inhaler Inhale 2 puffs into the lungs 2 (two) times daily.        . clotrimazole-betamethasone (LOTRISONE) cream Apply topically 2 (two) times daily.  30 g  0  . ONE TOUCH ULTRA TEST test strip USE AS DIRECTED  100 each  6   No current facility-administered medications on file prior to visit.    BP 120/80  Pulse 72  Temp(Src) 98.3 F (36.8 C)  Resp 16  Ht 5\' 5"  (1.651 m)  Wt 166 lb (75.297 kg)  BMI 27.62 kg/m2       Objective:   Physical Exam  Constitutional: She is oriented to person, place, and time. She appears well-developed and well-nourished. No distress.  HENT:  Head: Normocephalic and atraumatic.  Eyes: Conjunctivae and EOM are normal. Pupils are equal, round, and reactive to light.  Neck: Normal range of motion. Neck supple. No JVD present. No tracheal deviation present. No thyromegaly present.  Cardiovascular: Normal rate and regular  rhythm.   No murmur heard. Pulmonary/Chest: Effort normal. She has wheezes. She exhibits no tenderness.  Abdominal: Soft. Bowel sounds are normal.  Musculoskeletal: Normal range of motion. She exhibits no edema and no tenderness.  Lymphadenopathy:    She has no cervical adenopathy.  Neurological: She is alert and oriented to person, place, and time. She has normal reflexes. No cranial nerve deficit.  Skin: Skin is warm and dry. She is not diaphoretic.  Psychiatric: She has a normal mood and affect. Her behavior is normal.          Assessment & Plan:  Will increase his Symbicort for the next 30 days samples were given and she'll resume her normal dose.  We will add Januvia 100 mg by mouth daily 2 metformin to accomplish what the endocrinologist help to accomplish by adding 2 agents.  She has a followup with endocrinologist to monitor hemoglobin A1c so we will not draw one at this time.  We will follow her up in 3-4 months for her asthma

## 2012-10-06 ENCOUNTER — Other Ambulatory Visit (INDEPENDENT_AMBULATORY_CARE_PROVIDER_SITE_OTHER): Payer: BC Managed Care – PPO

## 2012-10-06 DIAGNOSIS — Z0000 Encounter for general adult medical examination without abnormal findings: Secondary | ICD-10-CM

## 2012-10-06 LAB — BASIC METABOLIC PANEL
CO2: 23 mEq/L (ref 19–32)
Calcium: 9.2 mg/dL (ref 8.4–10.5)
Chloride: 108 mEq/L (ref 96–112)
Glucose, Bld: 165 mg/dL — ABNORMAL HIGH (ref 70–99)
Potassium: 3.9 mEq/L (ref 3.5–5.1)
Sodium: 140 mEq/L (ref 135–145)

## 2012-10-06 LAB — HEPATIC FUNCTION PANEL
AST: 15 U/L (ref 0–37)
Albumin: 3.5 g/dL (ref 3.5–5.2)
Alkaline Phosphatase: 59 U/L (ref 39–117)
Total Protein: 7.2 g/dL (ref 6.0–8.3)

## 2012-10-06 LAB — CBC WITH DIFFERENTIAL/PLATELET
Basophils Relative: 0.3 % (ref 0.0–3.0)
Eosinophils Absolute: 0.2 10*3/uL (ref 0.0–0.7)
HCT: 37.4 % (ref 36.0–46.0)
Hemoglobin: 12.6 g/dL (ref 12.0–15.0)
Lymphocytes Relative: 36.4 % (ref 12.0–46.0)
Lymphs Abs: 2.6 10*3/uL (ref 0.7–4.0)
MCHC: 33.7 g/dL (ref 30.0–36.0)
MCV: 92.9 fl (ref 78.0–100.0)
Neutro Abs: 3.9 10*3/uL (ref 1.4–7.7)
RBC: 4.02 Mil/uL (ref 3.87–5.11)

## 2012-10-06 LAB — LIPID PANEL
Cholesterol: 228 mg/dL — ABNORMAL HIGH (ref 0–200)
Total CHOL/HDL Ratio: 7
Triglycerides: 83 mg/dL (ref 0.0–149.0)
VLDL: 16.6 mg/dL (ref 0.0–40.0)

## 2012-10-06 LAB — HEMOGLOBIN A1C: Hgb A1c MFr Bld: 6.9 % — ABNORMAL HIGH (ref 4.6–6.5)

## 2012-10-06 LAB — LDL CHOLESTEROL, DIRECT: Direct LDL: 169.6 mg/dL

## 2012-10-06 LAB — POCT URINALYSIS DIPSTICK
Spec Grav, UA: 1.025
pH, UA: 5.5

## 2012-10-14 ENCOUNTER — Encounter: Payer: Self-pay | Admitting: Family

## 2012-10-14 ENCOUNTER — Ambulatory Visit (INDEPENDENT_AMBULATORY_CARE_PROVIDER_SITE_OTHER): Payer: BC Managed Care – PPO | Admitting: Family

## 2012-10-14 VITALS — BP 112/84 | HR 91 | Ht 64.0 in | Wt 163.0 lb

## 2012-10-14 DIAGNOSIS — R079 Chest pain, unspecified: Secondary | ICD-10-CM

## 2012-10-14 DIAGNOSIS — E785 Hyperlipidemia, unspecified: Secondary | ICD-10-CM

## 2012-10-14 DIAGNOSIS — Z0000 Encounter for general adult medical examination without abnormal findings: Secondary | ICD-10-CM

## 2012-10-14 NOTE — Progress Notes (Signed)
Subjective:    Patient ID: Denise Beard, female    DOB: 04-Jul-1960, 52 y.o.   MRN: [id number]  HPI Pt is a 52 y.o. African American female who presents to PCP for annual physical. Pt reports undergoing bone density, mammogram and PAP 07/2012 at OB/GYN office. Pt reports rash/discoloration to bilateral feet; pt states a previous prescription for clotrimazole-betamethasone is no longer effective for treatment of rash. Pt also reports chest pain. States chest pain has been reoccurring for the past two weeks. States episodes of pain last 5-15 mins. Pain can occur at rest or with activity and resolves on it's own. Denies any SOB or diaphoresis with chest pain.   Review of Systems  Constitutional: Negative.   HENT: Negative.   Eyes: Negative.   Respiratory: Negative.   Cardiovascular: Positive for chest pain.  Gastrointestinal: Negative.   Endocrine: Negative.   Genitourinary: Negative.   Musculoskeletal: Negative.   Skin: Positive for rash.  Neurological: Negative.   Hematological: Negative.   Psychiatric/Behavioral: Negative.    Past Medical History  Diagnosis Date  . DIAB W/O COMP TYPE II/UNS NOT STATED UNCNTRL 04/06/2009  . ANXIETY 03/17/2007  . ASTHMA 06/26/2007  . GERD 06/26/2007  . CONSTIPATION, CHRONIC 02/27/2010  . Irritable bowel syndrome 02/27/2010  . UNSPEC POLYARTHROPATHY/POLYARTHRIT MX SITES 12/13/2009  . SEIZURE DISORDER 04/17/2010    History   Social History  . Marital Status: Legally Separated    Spouse Name: N/A    Number of Children: N/A  . Years of Education: N/A   Occupational History  . Not on file.   Social History Main Topics  . Smoking status: Never Smoker   . Smokeless tobacco: Not on file  . Alcohol Use: No  . Drug Use: No  . Sexually Active: Not on file   Other Topics Concern  . Not on file   Social History Narrative   Single   2 chldren   Works; Customer service manager for background checks     Past Surgical History  Procedure  Laterality Date  . Cesarean section    . Tubal ligation    . Abdominal hysterectomy      Family History  Problem Relation Age of Onset  . Thyroid disease Mother   . Cancer Mother   . Hyperlipidemia Mother   . Cancer Maternal Grandfather     Allergies  Allergen Reactions  . Iodine     Current Outpatient Prescriptions on File Prior to Visit  Medication Sig Dispense Refill  . AMITIZA 8 MCG capsule TAKE ONE CAPSULE BY MOUTH TWICE A DAY  60 capsule  1  . budesonide-formoterol (SYMBICORT) 80-4.5 MCG/ACT inhaler Inhale 2 puffs into the lungs 2 (two) times daily.        . clotrimazole-betamethasone (LOTRISONE) cream Apply topically 2 (two) times daily.  30 g  0  . ONE TOUCH ULTRA TEST test strip USE AS DIRECTED  100 each  6  . sitaGLIPtin (JANUVIA) 100 MG tablet Take 1 tablet (100 mg total) by mouth daily.  30 tablet  2  . zolpidem (AMBIEN) 10 MG tablet TAKE 1 TABLET AT BEDTIME AS NEEDED FOR SLEEP  30 tablet  2   No current facility-administered medications on file prior to visit.    BP 112/84  Pulse 91  Ht 5\' 4"  (1.626 m)  Wt 163 lb (73.936 kg)  BMI 27.97 kg/m2  SpO2 98%chart    Objective:   Physical Exam  Constitutional: She is oriented to person, place, and time.  She appears well-developed and well-nourished.  HENT:  Head: Normocephalic and atraumatic.  Eyes: Pupils are equal, round, and reactive to light.  Neck: Normal range of motion.  Cardiovascular: Normal rate.   Abdominal: Soft.  Musculoskeletal: Normal range of motion.  Neurological: She is alert and oriented to person, place, and time.  Skin: Skin is warm and dry. Rash noted.          Assessment & Plan:  1. Physical 2. Chest Pain 3. Dyslipidemia  EKG ordered to evaluate chest pain. Pt educated on dietary and physical activity lifestyle modification to aide in lowering cholesterol levels; will follow up in 3 months to re check cholesterol level.

## 2012-10-14 NOTE — Patient Instructions (Addendum)

## 2012-11-07 ENCOUNTER — Telehealth: Payer: Self-pay | Admitting: Internal Medicine

## 2012-11-07 MED ORDER — AZITHROMYCIN 250 MG PO TABS: ORAL_TABLET | ORAL | Status: DC

## 2012-11-07 NOTE — Telephone Encounter (Signed)
Pt is having chest tightness,cough and congestion. Pt has bronchitis requesting abx call into [facility]. Pt decline appt

## 2012-11-07 NOTE — Telephone Encounter (Signed)
Per dr Lovell Sheehan- may have a z pack a nd mucinex dm bid otc

## 2012-11-07 NOTE — Telephone Encounter (Signed)
Pt informed

## 2012-11-28 ENCOUNTER — Ambulatory Visit: Payer: BC Managed Care – PPO | Admitting: Internal Medicine

## 2012-12-19 ENCOUNTER — Encounter: Payer: Self-pay | Admitting: Internal Medicine

## 2012-12-19 ENCOUNTER — Ambulatory Visit (INDEPENDENT_AMBULATORY_CARE_PROVIDER_SITE_OTHER): Payer: BC Managed Care – PPO | Admitting: Internal Medicine

## 2012-12-19 VITALS — BP 124/80 | HR 70 | Temp 98.5°F | Resp 12 | Wt 165.0 lb

## 2012-12-19 DIAGNOSIS — E119 Type 2 diabetes mellitus without complications: Secondary | ICD-10-CM

## 2012-12-19 NOTE — Patient Instructions (Addendum)
Please start Metformin 1000 mg with breakfast - start with 1/2 tablet for 2 days.  Consider starting a statin for cholesterol.

## 2012-12-19 NOTE — Progress Notes (Signed)
Subjective:     Patient ID: Denise Beard, female   DOB: 04-Sep-1960, 52 y.o.   MRN: [id number]  HPI Denise Beard is a pleasant 52 year old woman, returning for f/u for DM 2, dx 2012, non-insulin-dependent, uncontrolled, without complications. Last visit 3.5 mo ago.  Her last hemoglobin A1c: Lab Results  Component Value Date   HGBA1C 6.9* 10/06/2012  Before this, she was exercising, and relaxed her eating. Prev. 8.1% (06/30/2012), previously 7.9% (02/18/2012).   ACR was 1.9 on 10/06/2012.  She is on:  - Januvia 100 in am - started in 09/2012, at last visit with PCP Her saxagliptin/metformin XR 09/998 mg (Kombyglyze) was stopped at last visit with PCP - metformin 1000 mg with dinner  She has been checking sugars daily but did not bring her sugar log. Her sugars increased after our last visit, but she relaxed her diet and exercise after the last hemoglobin A1c returned at 6.9. At the same time, she was taken off the metformin-saxagliptin combination, and replaced it only with general via without adding metformin back.   Her sugars now are: - am: 133-179 >>170-190 (lowest 138 >> felt dizzy) - before lunch: one value: 179 >> 150 - before dinner: 160 - bedtime: 109-164 >> not checking She has a OneTouch Ultra meter and tells me the strips are expensive.  Highest sugar 383, after dessert.  She had a nutrition class with [employer] (nutrition) in the past. At last visit, she told me that she changed her diet: came off sodas, she was eating more salads and vegetables in general, she had pie once a week. She also started to walk more: 2 blocks in am and same in pm.   Last eye exam 2012 >> will need to schedule; no CKD; no neuropathy.  Last BMP was normal, except glucose, at last visit with PCP. Her last set of lipids from 2 months ago showed decreased triglycerides but also decreased HDL and a high LDL at 169, which is stable from previous check in 2012. She did not want to start on a statin,  say she wanted to work on diet and exercise.  She also has a history of anxiety; asthma; GERD; constipation; polyarthropathy; seizure disorder.   Review of Systems Constitutional: no weight gain or  loss, no fatigue, no subjective hyperthermia/hypothermia Eyes: no blurry vision, no xerophthalmia ENT: no sore throat, no nodules palpated in throat, no dysphagia/odynophagia, no hoarseness Cardiovascular: no CP/SOB/palpitations/leg swelling Respiratory: no cough/SOB Gastrointestinal: no N/V/D/C Musculoskeletal: no muscle/joint aches Skin: no rashes Neurological: no tremors/numbness/tingling/dizziness  I reviewed pt's medications, allergies, PMH, social hx, family hx and no changes required, except as above.  Objective:   Physical Exam BP 124/80  Pulse 70  Temp(Src) 98.5 F (36.9 C) (Oral)  Resp 12  Wt 165 lb (74.844 kg)  BMI 28.31 kg/m2  SpO2 98% Wt Readings from Last 3 Encounters:  12/19/12 165 lb (74.844 kg)  10/14/12 163 lb (73.936 kg)  09/29/12 166 lb (75.297 kg)  Constitutional: slightly overweight, in NAD Eyes: PERRLA, EOMI, no exophthalmos ENT: moist mucous membranes, no thyromegaly, no cervical lymphadenopathy Cardiovascular: RRR, No MRG Respiratory: CTA B Gastrointestinal: abdomen soft, NT, ND, BS+ Skin: moist, warm, dark discoloration of skin on bilateral feet - ? Necrobiosis Lipoidica Diabeticorum  Foot exam performed today Assessment:     1. DM2, non-insulin-dependent, uncontrolled, without complications    Plan:     Pt with greatly improved hemoglobin A1c since last visit, due mostly to improved diet and activity.  However, she relaxed her diet and exercise after her last hemoglobin A1c, and her sugars have increased. Also, she was taken off her Kombiglyze and started on Januvia instead 2 months ago, but unfortunately, she did not add metformin separately with her breakfast. She is only on 1000 mg metformin with dinner now. - I advised her to increase metformin  to 1000 mg twice a day - Continue Januvia 100 mg in a.m. - again advised her to resume her diet, and get back to her previous exercise regimen - I again advised her to schedule an appointment with her eye doctor - I also advised her to consider starting a statin, especially since the last lipid panel was obtained at the time of increased diet and exercise, when the hemoglobin A1c was great, at 6.9. - I will see her back in 3 months with her log

## 2012-12-26 ENCOUNTER — Other Ambulatory Visit: Payer: Self-pay | Admitting: Internal Medicine

## 2013-01-14 ENCOUNTER — Ambulatory Visit: Payer: BC Managed Care – PPO | Admitting: Family

## 2013-02-06 ENCOUNTER — Ambulatory Visit (INDEPENDENT_AMBULATORY_CARE_PROVIDER_SITE_OTHER): Payer: BC Managed Care – PPO | Admitting: Internal Medicine

## 2013-02-06 ENCOUNTER — Encounter: Payer: Self-pay | Admitting: Internal Medicine

## 2013-02-06 VITALS — BP 110/70 | HR 72 | Temp 98.6°F | Resp 16 | Ht 64.0 in | Wt 164.0 lb

## 2013-02-06 DIAGNOSIS — E119 Type 2 diabetes mellitus without complications: Secondary | ICD-10-CM

## 2013-02-06 DIAGNOSIS — J45909 Unspecified asthma, uncomplicated: Secondary | ICD-10-CM

## 2013-02-06 DIAGNOSIS — J45901 Unspecified asthma with (acute) exacerbation: Secondary | ICD-10-CM

## 2013-02-06 DIAGNOSIS — F411 Generalized anxiety disorder: Secondary | ICD-10-CM

## 2013-02-06 NOTE — Progress Notes (Signed)
  Subjective:    Patient ID: Denise Beard, female    DOB: 1960-05-26, 52 y.o.   MRN: [id number]  HPI Sugars have improved with the additional metformin.  Asthma has been stable on Symbicort as her maintenance inhaler.  By the constipation has been stable.  Blood pressure is stable at 110/70 weight is stable   Review of Systems  Constitutional: Negative for activity change, appetite change and fatigue.  HENT: Negative for ear pain, congestion, neck pain, postnasal drip and sinus pressure.   Eyes: Negative for redness and visual disturbance.  Respiratory: Negative for cough, shortness of breath and wheezing.   Gastrointestinal: Negative for abdominal pain and abdominal distention.  Genitourinary: Negative for dysuria, frequency and menstrual problem.  Musculoskeletal: Negative for myalgias, joint swelling and arthralgias.  Skin: Negative for rash and wound.  Neurological: Negative for dizziness, weakness and headaches.  Hematological: Negative for adenopathy. Does not bruise/bleed easily.  Psychiatric/Behavioral: Negative for sleep disturbance and decreased concentration.       Objective:   Physical Exam  Constitutional: She is oriented to person, place, and time. She appears well-developed and well-nourished. No distress.  HENT:  Head: Normocephalic and atraumatic.  Right Ear: External ear normal.  Left Ear: External ear normal.  Nose: Nose normal.  Mouth/Throat: Oropharynx is clear and moist.  Eyes: Conjunctivae and EOM are normal. Pupils are equal, round, and reactive to light.  Neck: Normal range of motion. Neck supple. No JVD present. No tracheal deviation present. No thyromegaly present.  Cardiovascular: Normal rate, regular rhythm, normal heart sounds and intact distal pulses.   No murmur heard. Pulmonary/Chest: Effort normal and breath sounds normal. She has no wheezes. She exhibits no tenderness.  Abdominal: Soft. Bowel sounds are normal.  Musculoskeletal: Normal  range of motion. She exhibits no edema and no tenderness.  Lymphadenopathy:    She has no cervical adenopathy.  Neurological: She is alert and oriented to person, place, and time. She has normal reflexes. No cranial nerve deficit.  Skin: Skin is warm and dry. She is not diaphoretic.  Psychiatric: She has a normal mood and affect. Her behavior is normal.          Assessment & Plan:  Stable asthma Improving DM followed by endocrine Dermatitis on feet persistant Stable labs

## 2013-02-06 NOTE — Patient Instructions (Signed)
1 cup of white vinegar in i gallon of warm water soak for 20 min

## 2013-03-26 ENCOUNTER — Other Ambulatory Visit: Payer: Self-pay | Admitting: *Deleted

## 2013-03-26 ENCOUNTER — Telehealth: Payer: Self-pay | Admitting: Internal Medicine

## 2013-03-26 ENCOUNTER — Other Ambulatory Visit: Payer: Self-pay

## 2013-03-26 MED ORDER — SITAGLIPTIN PHOSPHATE 100 MG PO TABS: 100.0000 mg | ORAL_TABLET | Freq: Every day | ORAL | Status: DC

## 2013-03-26 NOTE — Telephone Encounter (Signed)
Pt needs new rx for sitaGLIPtin (JANUVIA) 100 MG tablet 1/ day  30 day refill [facility]

## 2013-03-27 ENCOUNTER — Ambulatory Visit: Payer: BC Managed Care – PPO | Admitting: Internal Medicine

## 2013-04-17 ENCOUNTER — Ambulatory Visit: Payer: BC Managed Care – PPO | Admitting: Internal Medicine

## 2013-04-20 ENCOUNTER — Encounter: Payer: Self-pay | Admitting: Internal Medicine

## 2013-04-20 ENCOUNTER — Ambulatory Visit (INDEPENDENT_AMBULATORY_CARE_PROVIDER_SITE_OTHER): Payer: BC Managed Care – PPO | Admitting: Internal Medicine

## 2013-04-20 VITALS — BP 124/72 | HR 84 | Temp 97.6°F | Resp 10 | Wt 163.0 lb

## 2013-04-20 DIAGNOSIS — E119 Type 2 diabetes mellitus without complications: Secondary | ICD-10-CM

## 2013-04-20 NOTE — Progress Notes (Signed)
Subjective:     Patient ID: Denise Beard, female   DOB: 27-Jan-1961, 52 y.o.   MRN: [id number]  HPI Denise Beard is a pleasant 52 y.o. woman, returning for f/u for DM 2, dx 2012, non-insulin-dependent, uncontrolled, without complications. Last visit 4 mo ago.  Her last hemoglobin A1c: Lab Results  Component Value Date   HGBA1C 7.3* 02/06/2013   HGBA1C 6.9* 10/06/2012   HGBA1C 8.1* 06/30/2012  Previously 7.9% (02/18/2012).   She is on:  - Januvia 100 in am - started in 09/2012, at last visit with PCP - metformin 500 mg with bid She was on saxagliptin/metformin XR 09/998 mg (Kombyglyze) in the past.  Her sugars now are: - am: 133-179 >>170-190 (lowest 138 >> felt dizzy) >> 150-168 - before lunch: one value: 179 >> 150 >> n/c - before dinner: 160 >> n/c - bedtime: 109-164 >> not checking >> 130-140 She has a OneTouch Ultra meter and tells me the strips are expensive.  Highest sugar 383 >> 343 after dinner (Thanksgiving).  She finished nutrition classes with [employer] (nutrition) in the past.   - Last eye exam 2012 >> will need to schedule; She will change insurances back to [facility] at beginning of the year >> will schedule an eye exam then. - no CKD; no neuropathy. ACR was 1.9 on 10/06/2012. Last BMP was normal, except glucose, at last visit with PCP.  Lab Results  Component Value Date   BUN 7 10/06/2012   CREATININE 0.6 10/06/2012   - Her last set of lipids from 2 months ago showed decreased triglycerides but also decreased HDL and a high LDL at 169, which is stable from previous check in 2012:  Lab Results  Component Value Date   CHOL 228* 10/06/2012   HDL 34.70* 10/06/2012   LDLDIRECT 169.6 10/06/2012   TRIG 83.0 10/06/2012   CHOLHDL 7 10/06/2012  She did not want to start on a statin, say she wanted to work on diet and exercise.  She also has a history of anxiety; asthma; GERD; constipation; polyarthropathy; seizure disorder.   Review of Systems Constitutional: no weight gain or   loss, no fatigue, no subjective hyperthermia/hypothermia Eyes: no blurry vision, no xerophthalmia ENT: no sore throat, no nodules palpated in throat, no dysphagia/odynophagia, no hoarseness Cardiovascular: no CP/SOB/palpitations/leg swelling Respiratory: no cough/SOB Gastrointestinal: no N/V/D/C Musculoskeletal: no muscle/joint aches Skin: no rashes Neurological: no tremors/numbness/tingling/dizziness  I reviewed pt's medications, allergies, PMH, social hx, family hx and no changes required, except as above.  Objective:   Physical Exam BP 124/72  Pulse 84  Temp(Src) 97.6 F (36.4 C) (Oral)  Resp 10  Wt 163 lb (73.936 kg)  SpO2 96% Wt Readings from Last 3 Encounters:  04/20/13 163 lb (73.936 kg)  02/06/13 164 lb (74.39 kg)  12/19/12 165 lb (74.844 kg)  Constitutional: slightly overweight, in NAD Eyes: PERRLA, EOMI, no exophthalmos ENT: moist mucous membranes, no thyromegaly, no cervical lymphadenopathy Cardiovascular: RRR, No MRG Respiratory: CTA B Gastrointestinal: abdomen soft, NT, ND, BS+ Skin: moist, warm  Assessment:     1. DM2, non-insulin-dependent, uncontrolled, without complications    Plan:     Pt with worsened hemoglobin A1c since last visit. She is only on 500 mg metformin bid now despite advice to increase to 1000 mg bid. She again does not bring a sugar log. - I advised her to  - increase metformin to 1000 mg twice a day (I am not sure why she did not do this at  last visit, as advised - she mentions she forgot...) - Continue Januvia 100 mg in a.m. - she will schedule an appointment with her eye doctor after her insurance changes at the beginning of the year - I will see her back in 3 months with her log

## 2013-04-20 NOTE — Patient Instructions (Signed)
Please increase Metformin to 1000 mg 2x a day. Continue Januvia 100 mg in am. Please return in 3 months with your sugar log.

## 2013-04-22 ENCOUNTER — Other Ambulatory Visit: Payer: Self-pay | Admitting: Internal Medicine

## 2013-05-06 ENCOUNTER — Other Ambulatory Visit: Payer: Self-pay | Admitting: Internal Medicine

## 2013-05-06 ENCOUNTER — Encounter: Payer: Self-pay | Admitting: Internal Medicine

## 2013-05-06 ENCOUNTER — Telehealth: Payer: Self-pay | Admitting: Internal Medicine

## 2013-05-06 NOTE — Telephone Encounter (Signed)
Patient Information:  Caller Name: Ruthanne  Phone: [phone]  Patient: Denise Beard, Denise Beard  Gender: Female  DOB: 1961-03-27  Age: 52 Years  PCP: Darryll Capers (Adults only)  Pregnant: No  Office Follow Up:  Does the office need to follow up with this patient?: No  Instructions For The Office: N/A  RN Note:  Educated must be seen for antibiotics per MD orders. Mild headache present intermittently. Reports swelling of cheekbones. Chemical smell.  Hydrate & humidify.  No appointments remain for 05/06/13 at [facility]; declined to be seen at another office.  Scheduled for 12/158/14 per caller request.   Symptoms  Reason For Call & Symptoms: Sinus congestion and mild cheekbone pain with nasal stuffiness.  Called to reqeust antibiotic for "sinus infection."  FBS 163 at 0700.  Reviewed Health History In EMR: Yes  Reviewed Medications In EMR: Yes  Reviewed Allergies In EMR: Yes  Reviewed Surgeries / Procedures: Yes  Date of Onset of Symptoms: 04/29/2013  Treatments Tried: Sudafed  Treatments Tried Worked: No OB / GYN:  LMP: Unknown  Guideline(s) Used:  Sinus Pain and Congestion  Disposition Per Guideline:   Go to Office Now  Reason For Disposition Reached:   Redness or swelling on the cheek, forehead, or around the eye  Advice Given:  Reassurance:   Sinus congestion is a normal part of a cold.  Antibiotics are not helpful for the sinus congestion that occurs with colds.  For a Runny Nose With Profuse Discharge:  Nasal mucus and discharge helps to wash viruses and bacteria out of the nose and sinuses.  Blowing the nose is all that is needed.  For a Stuffy Nose - Use Nasal Washes:  Introduction: Saline (salt water) nasal irrigation (nasal wash) is an effective and simple home remedy for treating stuffy nose and sinus congestion. The nose can be irrigated by pouring, spraying, or squirting salt water into the nose and then letting it run back out.  Methods: There are several ways  to perform nasal irrigation. You can use a saline nasal spray bottle (available over-the-counter), a rubber ear syringe, a medical syringe without the needle, or a Neti Pot.  Pain and Fever Medicines:  For pain or fever relief, take either acetaminophen or ibuprofen.  Treat fevers above 101 F (38.3 C). The goal of fever therapy is to bring the fever down to a comfortable level. Remember that fever medicine usually lowers fever 2 degrees F (1 - 1 1/2 degrees C).  Hydration:  Drink plenty of liquids (6-8 glasses of water daily). If the air in your home is dry, use a cool mist humidifier  Expected Course:  Sinus congestion from viral upper respiratory infections (colds) usually lasts 5-10 days.  Occasionally a cold can worsen and turn into bacterial sinusitis. Clues to this are sinus symptoms lasting longer than 10 days, fever lasting longer than 3 days, and worsening pain. Bacterial sinusitis may need antibiotic treatment.  Call Back If:   You become worse.  Patient Will Follow Care Advice:  YES  Appointment Scheduled:  05/07/2013 14:45:00 Appointment Scheduled Provider:  Evelena Peat [facility])

## 2013-05-07 ENCOUNTER — Encounter: Payer: Self-pay | Admitting: Family Medicine

## 2013-05-07 ENCOUNTER — Ambulatory Visit (INDEPENDENT_AMBULATORY_CARE_PROVIDER_SITE_OTHER): Payer: BC Managed Care – PPO | Admitting: Family Medicine

## 2013-05-07 VITALS — BP 128/80 | HR 98 | Temp 97.5°F | Wt 160.0 lb

## 2013-05-07 DIAGNOSIS — J019 Acute sinusitis, unspecified: Secondary | ICD-10-CM

## 2013-05-07 MED ORDER — AZITHROMYCIN 250 MG PO TABS: ORAL_TABLET | ORAL | Status: AC

## 2013-05-07 NOTE — Progress Notes (Signed)
Pre visit review using our clinic review tool, if applicable. No additional management support is needed unless otherwise documented below in the visit note. 

## 2013-05-07 NOTE — Progress Notes (Signed)
   Subjective:    Patient ID: Denise Beard, female    DOB: 09/09/1960, 52 y.o.   MRN: [id number]  HPI Patient seen for acute issue of upper respiratory infection  She's had almost 2 week history of progressive facial pain maxillary sinus region. Minimal cough. She's had some upper teeth pain. Mostly postnasal drip symptoms. Intermittent mild headaches. Increased malaise. She has not tried any saline nasal irrigation. She's taken occasional Mucinex.  Past Medical History  Diagnosis Date  . DIAB W/O COMP TYPE II/UNS NOT STATED UNCNTRL 04/06/2009  . ANXIETY 03/17/2007  . ASTHMA 06/26/2007  . GERD 06/26/2007  . CONSTIPATION, CHRONIC 02/27/2010  . Irritable bowel syndrome 02/27/2010  . UNSPEC POLYARTHROPATHY/POLYARTHRIT MX SITES 12/13/2009  . SEIZURE DISORDER 04/17/2010   Past Surgical History  Procedure Laterality Date  . Cesarean section    . Tubal ligation    . Abdominal hysterectomy      reports that she has never smoked. She does not have any smokeless tobacco history on file. She reports that she does not drink alcohol or use illicit drugs. family history includes Cancer in her maternal grandfather and mother; Hyperlipidemia in her mother; Thyroid disease in her mother. Allergies  Allergen Reactions  . Iodine       Review of Systems  Constitutional: Positive for fatigue. Negative for fever, chills and appetite change.  HENT: Positive for congestion.   Respiratory: Positive for cough.   Neurological: Positive for headaches.       Objective:   Physical Exam  Constitutional: She appears well-developed and well-nourished.  HENT:  Right Ear: External ear normal.  Left Ear: External ear normal.  Mouth/Throat: Oropharynx is clear and moist.  Neck: Neck supple.  Cardiovascular: Normal rate and regular rhythm.   Pulmonary/Chest: Effort normal and breath sounds normal. No respiratory distress. She has no wheezes. She has no rales.  Lymphadenopathy:    She has no cervical  adenopathy.          Assessment & Plan:  Acute sinusitis. We explained these are mostly viral. We've recommended Mucinex, increase fluids and observation for now. If she has any persistent or worsening symptoms start Zithromax.

## 2013-05-07 NOTE — Patient Instructions (Signed)

## 2013-07-17 ENCOUNTER — Encounter: Payer: Self-pay | Admitting: Internal Medicine

## 2013-07-17 ENCOUNTER — Ambulatory Visit (INDEPENDENT_AMBULATORY_CARE_PROVIDER_SITE_OTHER): Payer: Commercial Managed Care - PPO | Admitting: Internal Medicine

## 2013-07-17 VITALS — BP 126/80 | HR 76 | Temp 98.2°F | Resp 16 | Ht 64.0 in | Wt 160.0 lb

## 2013-07-17 DIAGNOSIS — E1165 Type 2 diabetes mellitus with hyperglycemia: Principal | ICD-10-CM

## 2013-07-17 DIAGNOSIS — J45909 Unspecified asthma, uncomplicated: Secondary | ICD-10-CM

## 2013-07-17 DIAGNOSIS — IMO0001 Reserved for inherently not codable concepts without codable children: Secondary | ICD-10-CM

## 2013-07-17 LAB — BASIC METABOLIC PANEL
BUN: 6 mg/dL (ref 6–23)
CO2: 26 meq/L (ref 19–32)
CREATININE: 0.6 mg/dL (ref 0.4–1.2)
Calcium: 9.5 mg/dL (ref 8.4–10.5)
Chloride: 102 mEq/L (ref 96–112)
GFR: 128.06 mL/min (ref >60.00)
Glucose, Bld: 127 mg/dL — ABNORMAL HIGH (ref 70–99)
Potassium: 4.4 mEq/L (ref 3.5–5.1)
Sodium: 135 mEq/L (ref 135–145)

## 2013-07-17 LAB — HEMOGLOBIN A1C: Hgb A1c MFr Bld: 7.5 % — ABNORMAL HIGH (ref 4.6–6.5)

## 2013-07-17 MED ORDER — SITAGLIPTIN PHOS-METFORMIN HCL 50-1000 MG PO TABS: 1.0000 | ORAL_TABLET | Freq: Two times a day (BID) | ORAL | Status: DC

## 2013-07-17 NOTE — Progress Notes (Signed)
Subjective:    Patient ID: Denise Beard, female    DOB: 04/27/1961, 53 y.o.   MRN: [id number]  Diabetes Pertinent negatives for hypoglycemia include no dizziness or headaches. Pertinent negatives for diabetes include no fatigue and no weakness.   Follow up asthma and DM Last AIC was in 7's Does not check cbgs!  Needs LDL Review of Systems  Constitutional: Negative for activity change, appetite change and fatigue.  HENT: Negative for congestion, ear pain, postnasal drip and sinus pressure.   Eyes: Negative for redness and visual disturbance.  Respiratory: Negative for cough, shortness of breath and wheezing.   Gastrointestinal: Negative for abdominal pain and abdominal distention.  Genitourinary: Negative for dysuria, frequency and menstrual problem.  Musculoskeletal: Negative for arthralgias, joint swelling, myalgias and neck pain.  Skin: Negative for rash and wound.  Neurological: Negative for dizziness, weakness and headaches.  Hematological: Negative for adenopathy. Does not bruise/bleed easily.  Psychiatric/Behavioral: Negative for sleep disturbance and decreased concentration.   Past Medical History  Diagnosis Date  . DIAB W/O COMP TYPE II/UNS NOT STATED UNCNTRL 04/06/2009  . ANXIETY 03/17/2007  . ASTHMA 06/26/2007  . GERD 06/26/2007  . CONSTIPATION, CHRONIC 02/27/2010  . Irritable bowel syndrome 02/27/2010  . UNSPEC POLYARTHROPATHY/POLYARTHRIT MX SITES 12/13/2009  . SEIZURE DISORDER 04/17/2010    History   Social History  . Marital Status: Legally Separated    Spouse Name: N/A    Number of Children: N/A  . Years of Education: N/A   Occupational History  . Not on file.   Social History Main Topics  . Smoking status: Never Smoker   . Smokeless tobacco: Not on file  . Alcohol Use: No  . Drug Use: No  . Sexual Activity: Not on file   Other Topics Concern  . Not on file   Social History Narrative   Single   2 chldren   Works; Nurse, mental health for  background checks     Past Surgical History  Procedure Laterality Date  . Cesarean section    . Tubal ligation    . Abdominal hysterectomy      Family History  Problem Relation Age of Onset  . Thyroid disease Mother   . Cancer Mother   . Hyperlipidemia Mother   . Cancer Maternal Grandfather     Allergies  Allergen Reactions  . Iodine     Current Outpatient Prescriptions on File Prior to Visit  Medication Sig Dispense Refill  . budesonide-formoterol (SYMBICORT) 80-4.5 MCG/ACT inhaler Inhale 2 puffs into the lungs 2 (two) times daily.        . ONE TOUCH ULTRA TEST test strip USE AS DIRECTED  100 each  6  . sitaGLIPtin (JANUVIA) 100 MG tablet Take 1 tablet (100 mg total) by mouth daily.  30 tablet  11  . zolpidem (AMBIEN) 10 MG tablet TAKE 1 TABLET AT BEDTIME AS NEEDED FOR SLEEP  30 tablet  3   No current facility-administered medications on file prior to visit.    BP 126/80  Pulse 76  Temp(Src) 98.2 F (36.8 C)  Resp 16  Ht 5\' 4"  (1.626 m)  Wt 160 lb (72.576 kg)  BMI 27.45 kg/m2        Objective:   Physical Exam  Constitutional: She is oriented to person, place, and time. She appears well-developed and well-nourished.  HENT:  Head: Normocephalic and atraumatic.  Neck: Normal range of motion. Neck supple.  Cardiovascular: Normal rate and regular rhythm.   Murmur heard.  Pulmonary/Chest: Effort normal and breath sounds normal.  Abdominal: Soft. Bowel sounds are normal.  Neurological: She is alert and oriented to person, place, and time.  Skin: Skin is warm and dry.          Assessment & Plan:  Last a1c was 7.3 due microglobulin and LDL as well as A1c   I have spent more than 30 minutes examining this patient face-to-face of which over half was spent in counseling about DM and diet and monitoring  Asthma stable  Has used the rescue inhailer in the cold

## 2013-07-17 NOTE — Addendum Note (Signed)
Addended by: Elmer Picker on: 07/17/2013 05:51 PM   Modules accepted: Orders

## 2013-07-17 NOTE — Progress Notes (Signed)
Pre visit review using our clinic review tool, if applicable. No additional management support is needed unless otherwise documented below in the visit note. 

## 2013-07-17 NOTE — Patient Instructions (Signed)
The patient is instructed to continue all medications as prescribed. Schedule followup with check out clerk upon leaving the clinic  

## 2013-07-18 LAB — MICROALBUMIN / CREATININE URINE RATIO
CREATININE, URINE: 74.9 mg/dL
Microalb Creat Ratio: 18.2 mg/g (ref 0.0–30.0)
Microalb, Ur: 1.36 mg/dL (ref 0.00–1.89)

## 2013-07-20 LAB — LDL CHOLESTEROL, DIRECT: LDL DIRECT: 179.6 mg/dL

## 2013-07-21 ENCOUNTER — Telehealth: Payer: Self-pay

## 2013-07-21 ENCOUNTER — Other Ambulatory Visit: Payer: Self-pay | Admitting: Internal Medicine

## 2013-07-21 NOTE — Telephone Encounter (Signed)
Relevant patient education assigned to patient using Emmi. ° °

## 2013-07-22 ENCOUNTER — Telehealth: Payer: Self-pay | Admitting: Internal Medicine

## 2013-07-22 NOTE — Telephone Encounter (Signed)
Should NOT be taking the regular metformin 1000 mg in addition to the [place].  Stop regular metformin.

## 2013-07-22 NOTE — Telephone Encounter (Signed)
Pt informed

## 2013-07-22 NOTE — Telephone Encounter (Signed)
Patient Information:  Caller Name: Kealey  Phone: [phone]  Patient: Denise Beard, Denise Beard  Gender: Female  DOB: Jul 19, 1960  Age: 53 Years  PCP: Benay Pillow (Adults only)  Pregnant: No  Office Follow Up:  Does the office need to follow up with this patient?: Yes  Instructions For The Office: OVERRIDE. PATIENT HAVING MEDICATION SYMPTOMS. Patient complaining of diarrhea with the Janmet. She is also on Metformin 1000mg  bid.  PLEASE REVIEW MEDICATION AND CONTACT PATIENT REGARDING SIDE EFFECTS  RN Note:  OVERRIDE SYMPTOMS. Patient complaining of diarrhea with the Janmet. She is also on Metformin 1000mg  bid.  PLEASE REVIEW MEDICATION AND CONTACT PATIENT REGARDING SIDE EFFECTS  Symptoms  Reason For Call & Symptoms: Patient states in office on Friday 07/17/13. She had a medication change.  Stopped Januvia and start Janment 50-1000mg   on Saturday 07/18/13. she is also taking Metformin 1000mg  bid.   Since changing medication, +diarrhea liquid brown 3-4 x daily , stomach upset. +nausea.  GBS 150-180.  Reviewed Health History In EMR: Yes  Reviewed Medications In EMR: Yes  Reviewed Allergies In EMR: Yes  Reviewed Surgeries / Procedures: Yes  Date of Onset of Symptoms: 07/18/2013 OB / GYN:  LMP: Unknown  Guideline(s) Used:  Diarrhea  Disposition Per Guideline:   Home Care  Reason For Disposition Reached:   Mild diarrhea  Advice Given:  Fluids:  Drink more fluids, at least 8-10 glasses (8 oz or 240 ml) daily.  For example: sports drinks, diluted fruit juices, soft drinks.  Supplement this with saltine crackers or soups to make certain that you are getting sufficient fluid and salt to meet your body's needs.  Avoid caffeinated beverages (Reason: caffeine is mildly dehydrating).  Nutrition:  Other acceptable foods include: bananas, yogurt, crackers, soup.  RN Overrode Recommendation:  Document Patient  OVERRIDE SYMPTOMS. Patient complaining of diarrhea with the Janmet. She is also on  Metformin 1000mg  bid.  PLEASE REVIEW MEDICATION AND CONTACT PATIENT REGARDING SIDE EFFECTS

## 2013-07-22 NOTE — Telephone Encounter (Signed)
Could you answer since Dr Arnoldo Morale is out of the office?

## 2013-08-07 ENCOUNTER — Encounter: Payer: Self-pay | Admitting: Internal Medicine

## 2013-08-07 ENCOUNTER — Ambulatory Visit (INDEPENDENT_AMBULATORY_CARE_PROVIDER_SITE_OTHER): Payer: Commercial Managed Care - PPO | Admitting: Internal Medicine

## 2013-08-07 VITALS — BP 112/64 | HR 82 | Temp 98.3°F | Resp 12 | Wt 164.0 lb

## 2013-08-07 DIAGNOSIS — E119 Type 2 diabetes mellitus without complications: Secondary | ICD-10-CM

## 2013-08-07 MED ORDER — GLIPIZIDE ER 5 MG PO TB24: 5.0000 mg | ORAL_TABLET | Freq: Every day | ORAL | Status: DC

## 2013-08-07 NOTE — Patient Instructions (Signed)
Please start Glipizide XL 5 mg in am. Stop Janumet. Start Metformin 1000 mg 2x a day. Start Januvia 100 mg in am.  Please return in 1 month with your sugar log.

## 2013-08-07 NOTE — Progress Notes (Signed)
Subjective:     Patient ID: Denise Beard, female   DOB: 01/16/1961, 53 y.o.   MRN: [id number]  HPI Denise Beard is a pleasant 53 y.o. woman, returning for f/u for DM 2, dx 2012, non-insulin-dependent, uncontrolled, without complications. Last visit 3 mo ago.  Her last hemoglobin A1c: Lab Results  Component Value Date   HGBA1C 7.5* 07/17/2013   HGBA1C 7.3* 02/06/2013   HGBA1C 6.9* 10/06/2012  Previously 7.9% (02/18/2012).   She started on on JanuMet 50/1000 bid on 07/17/2013 - continues today >> does not feel well on this >> lightheaded, HA, blurry vision.  She was on:  - Januvia 100 in am - started in 09/2012, at last visit with PCP - metformin 500 mg with bid >> 1000 mg bid She was on saxagliptin/metformin XR 09/998 mg (Kombyglyze) in the past.  Her sugars now are - checks 2-3 x a day - brings log: - am: 133-179 >>170-190 (lowest 138 >> felt dizzy) >> 150-168 >> 133-194 - 2h after b'fast: 189-288 - before lunch: one value: 179 >> 150 >> n/c >> 177, 191 - 2h after lunch: 120, 136, 169, 211 - before dinner: 160 >> n/c >> 190 - bedtime: 109-164 >> not checking >> 130-140 >> 152, 164, 233 Strips are expensive. Meter: One Touch Ultra 2 Highest sugar 288. Lowest 120.   She had nutrition classes with [employer] education (nutrition) in the past.  Started walking more.  - Last eye exam 2013 >> has a new one coming up 08/2013. - no CKD; no neuropathy. ACR was 1.9 on 10/06/2012. Lab Results  Component Value Date   BUN 6 07/17/2013   CREATININE 0.6 07/17/2013   - Lipids:  Lab Results  Component Value Date   CHOL 228* 10/06/2012   HDL 34.70* 10/06/2012   LDLDIRECT 179.6 07/17/2013   TRIG 83.0 10/06/2012   CHOLHDL 7 10/06/2012  She did not want to start on a statin, say she wanted to work on diet and exercise.  She also has a history of anxiety; asthma; GERD; constipation; polyarthropathy; seizure disorder.   Review of Systems Constitutional: no weight gain or  loss, no fatigue, no subjective  hyperthermia/hypothermia Eyes: no blurry vision, no xerophthalmia ENT: no sore throat, no nodules palpated in throat, no dysphagia/odynophagia, no hoarseness Cardiovascular: no CP/SOB/palpitations/leg swelling Respiratory: no cough/SOB Gastrointestinal: no N/V/D/C Musculoskeletal: no muscle/joint aches Skin: no rashes Neurological: no tremors/numbness/tingling/+ dizziness, + HA - both with JanuMet  I reviewed pt's medications, allergies, PMH, social hx, family hx and no changes required, except as above.  Objective:   Physical Exam BP 112/64  Pulse 82  Temp(Src) 98.3 F (36.8 C) (Oral)  Resp 12  Wt 164 lb (74.39 kg)  SpO2 98% Wt Readings from Last 3 Encounters:  08/07/13 164 lb (74.39 kg)  07/17/13 160 lb (72.576 kg)  05/07/13 160 lb (72.576 kg)  Constitutional: slightly overweight, in NAD Eyes: PERRLA, EOMI, no exophthalmos ENT: moist mucous membranes, no thyromegaly, no cervical lymphadenopathy Cardiovascular: RRR, No MRG Respiratory: CTA B Gastrointestinal: abdomen soft, NT, ND, BS+ Skin: moist, warm Neuro: DTR normal in all 4  Assessment:     1. DM2, non-insulin-dependent, uncontrolled, without complications    Plan:     Pt with worsened hemoglobin A1c since last visit. She was switched to combination JanuMet, but she does not feel good on this: dizziness, blurry vision, lightheadedness. The combination also has her taking 50 mg of Januvia in am and 50 mg at night, but is better  to be taken all in am. She brings a sugar log >> sugars increased since last visit. - I advised her to  Patient Instructions  Please start Glipizide XL 5 mg in am. Stop Janumet. Start Metformin 1000 mg 2x a day. Start Januvia 100 mg in am.  Please return in 1 month with your sugar log.   - I was thinking to try Invokana but cost is an issue. We can reconsider at last visit. - can try ReliOn meter and strips >> cheaper

## 2013-08-22 ENCOUNTER — Ambulatory Visit (INDEPENDENT_AMBULATORY_CARE_PROVIDER_SITE_OTHER): Payer: Commercial Managed Care - PPO | Admitting: Physician Assistant

## 2013-08-22 VITALS — BP 120/78 | HR 77 | Temp 98.0°F | Resp 18 | Ht 66.5 in | Wt 164.8 lb

## 2013-08-22 DIAGNOSIS — J069 Acute upper respiratory infection, unspecified: Secondary | ICD-10-CM

## 2013-08-22 DIAGNOSIS — B9789 Other viral agents as the cause of diseases classified elsewhere: Secondary | ICD-10-CM

## 2013-08-22 DIAGNOSIS — J45901 Unspecified asthma with (acute) exacerbation: Secondary | ICD-10-CM

## 2013-08-22 MED ORDER — HYDROCOD POLST-CHLORPHEN POLST 10-8 MG/5ML PO LQCR: 5.0000 mL | Freq: Two times a day (BID) | ORAL | Status: AC

## 2013-08-22 MED ORDER — IPRATROPIUM BROMIDE 0.06 % NA SOLN: 2.0000 | Freq: Three times a day (TID) | NASAL | Status: DC

## 2013-08-22 MED ORDER — GUAIFENESIN ER 1200 MG PO TB12: 1.0000 | ORAL_TABLET | Freq: Two times a day (BID) | ORAL | Status: AC

## 2013-08-22 NOTE — Patient Instructions (Signed)
Monitor your sugar it may increase slightly with the cough syrup. Use your albuterol more often - not only when you are wheezing but also with trouble getting a full deep breath and that dry irritating cough Push fluids Tylenol and motrin are ok to use

## 2013-08-22 NOTE — Progress Notes (Signed)
   Subjective:    Patient ID: Denise Beard, female    DOB: 1960/08/12, 53 y.o.   MRN: [id number]  HPI Pt presents to clinic with cold symptoms for the last 4 days.  She does not really feel sick but she has never had allergies before.  She has h/o asthma but has not had to use her albuterol inhaler more than normal.  She was up a lot last night due to the cough.  The cough is from her throat like a tickle.    OTC meds - Robitussin last pm - helped slightly Sick contacts - none   Review of Systems  Constitutional: Positive for chills. Negative for fever.  HENT: Positive for congestion, postnasal drip, sore throat and voice change. Negative for rhinorrhea.   Respiratory: Positive for cough and wheezing (rare - only had to use her albuterol once in the last week which is normal for her). Negative for shortness of breath.   Gastrointestinal: Negative for nausea, vomiting and diarrhea.  Musculoskeletal: Negative for myalgias.  Allergic/Immunologic: Negative for environmental allergies (not in the past).  Neurological: Negative for headaches.       Objective:   Physical Exam  Vitals reviewed. Constitutional: She is oriented to person, place, and time. She appears well-developed and well-nourished.  HENT:  Head: Normocephalic and atraumatic.  Right Ear: Hearing, tympanic membrane, external ear and ear canal normal.  Left Ear: Hearing, tympanic membrane, external ear and ear canal normal.  Nose: Mucosal edema (red) present.  Mouth/Throat: Uvula is midline, oropharynx is clear and moist and mucous membranes are normal.  Eyes: Conjunctivae are normal.  Cardiovascular: Normal rate, regular rhythm and normal heart sounds.   No murmur heard. Pulmonary/Chest: Effort normal and breath sounds normal. She has no wheezes (no wheezing with dorced expiration).  Neurological: She is alert and oriented to person, place, and time.  Skin: Skin is warm and dry.  Psychiatric: She has a normal mood and  affect. Her behavior is normal. Judgment and thought content normal.       Assessment & Plan:    Viral URI with cough - Plan: Guaifenesin (MUCINEX MAXIMUM STRENGTH) 1200 MG TB12, ipratropium (ATROVENT) 0.06 % nasal spray, chlorpheniramine-HYDROcodone (TUSSIONEX PENNKINETIC ER) 10-8 MG/5ML LQCR  Symptomatic treatment.  She will closely monitor her glucose while using the cough syrup.  She may notice an increase use of her albuterol while she is sick and that is ok.  Windell Hummingbird PA-C  [facility] 08/22/2013 12:12 PM

## 2013-09-07 LAB — HM DIABETES EYE EXAM

## 2013-09-10 ENCOUNTER — Ambulatory Visit (INDEPENDENT_AMBULATORY_CARE_PROVIDER_SITE_OTHER): Payer: Commercial Managed Care - PPO | Admitting: Internal Medicine

## 2013-09-10 ENCOUNTER — Encounter: Payer: Self-pay | Admitting: Internal Medicine

## 2013-09-10 VITALS — BP 112/70 | HR 81 | Temp 97.5°F | Resp 12 | Wt 160.0 lb

## 2013-09-10 DIAGNOSIS — E1165 Type 2 diabetes mellitus with hyperglycemia: Secondary | ICD-10-CM

## 2013-09-10 DIAGNOSIS — IMO0002 Reserved for concepts with insufficient information to code with codable children: Secondary | ICD-10-CM

## 2013-09-10 DIAGNOSIS — IMO0001 Reserved for inherently not codable concepts without codable children: Secondary | ICD-10-CM

## 2013-09-10 NOTE — Patient Instructions (Signed)
Please continue current regimen. Please come back for a follow-up appointment in 3 months with your log. Please come back in 1 mo for HbA1c.

## 2013-09-10 NOTE — Progress Notes (Signed)
Subjective:     Patient ID: Denise Beard, female   DOB: [birth year], 53 y.o.   MRN: [id number]  HPI Denise Beard is a pleasant 53 y.o. woman, returning for f/u for DM 2, dx 2012, non-insulin-dependent, uncontrolled, without complications. Last visit 1 mo ago.  Her last hemoglobin A1c: Lab Results  Component Value Date   HGBA1C 7.5* 07/17/2013   HGBA1C 7.3* 02/06/2013   HGBA1C 6.9* 10/06/2012  Previously 7.9% (02/18/2012).   She is on:  - Januvia 100 in am - started in 09/2012, at last visit with PCP - metformin 500 mg with bid >> 1000 mg bid - glipizide XL 5 mg a day She was on saxagliptin/metformin XR 09/998 mg (Kombyglyze) in the past. She started on on JanuMet 50/1000 bid on 07/17/2013 >> lightheaded, HA, blurry vision.  Her sugars now are - checks 2-3 x a day - brings log: - am: 133-179 >>170-190 (lowest 138 >> felt dizzy) >> 150-168 >> 133-194 >> 115-154 - 2h after b'fast: 189-288 >> n/c - before lunch: one value: 179 >> 150 >> n/c >> 177, 191 >> 153 - 2h after lunch: 120, 136, 169, 211 >> n/c - before dinner: 160 >> n/c >> 190 >> n/c >> 130, 153, 169 - bedtime: 109-164 >> not checking >> 130-140 >> 152, 164, 233 >> 174 Strips are expensive. Meter: One Touch Ultra 2 Highest sugar 288. Lowest 120.   She had nutrition classes with [employer] education (nutrition) in the past.   - Last eye exam 09/07/2013. No DR. (Dr Macarthur Critchley [facility]) - no CKD; no neuropathy. ACR was 1.9 on 10/06/2012. Lab Results  Component Value Date   BUN 6 07/17/2013   CREATININE 0.6 07/17/2013   - Lipids:  Lab Results  Component Value Date   CHOL 228* 10/06/2012   HDL 34.70* 10/06/2012   LDLDIRECT 179.6 07/17/2013   TRIG 83.0 10/06/2012   CHOLHDL 7 10/06/2012  She did not want to start on a statin, say she wanted to work on diet and exercise.  She also has a history of anxiety; asthma; GERD; constipation; polyarthropathy; seizure disorder.   Review of Systems Constitutional: no weight gain  or  loss, no fatigue, no subjective hyperthermia/hypothermia Eyes: no blurry vision, no xerophthalmia ENT: no sore throat, no nodules palpated in throat, no dysphagia/odynophagia, no hoarseness Cardiovascular: no CP/SOB/palpitations/leg swelling Respiratory: + all: cough/SOB/wheezing  - had URI Gastrointestinal: no N/V/D/C Musculoskeletal: no muscle/joint aches Skin: no rashes Neurological: no tremors/numbness/tingling/+ dizziness, + HA - both with JanuMet  I reviewed pt's medications, allergies, PMH, social hx, family hx and no changes required, except as above.  Objective:   Physical Exam BP 112/70  Pulse 81  Temp(Src) 97.5 F (36.4 C) (Oral)  Resp 12  Wt 160 lb (72.576 kg)  SpO2 97% Wt Readings from Last 3 Encounters:  09/10/13 160 lb (72.576 kg)  08/22/13 164 lb 12.8 oz (74.753 kg)  08/07/13 164 lb (74.39 kg)  Constitutional: slightly overweight, in NAD Eyes: PERRLA, EOMI, no exophthalmos ENT: moist mucous membranes, no thyromegaly, no cervical lymphadenopathy Cardiovascular: RRR, No MRG Respiratory: CTA B Gastrointestinal: abdomen soft, NT, ND, BS+ Skin: moist, warm Neuro: DTR normal in all 4  Assessment:     1. DM2, non-insulin-dependent, uncontrolled, without complications    Plan:     Pt with improved sugars after stopping the Janumet and starting Januvia + Metformin separately and adding Glipizide, however, she does not have many checks after meals. - I advised  her to  Patient Instructions  Please continue current regimen. Please come back for a follow-up appointment in 3 months with your log. Please come back in 1 mo for HbA1c. - Invokana is an option at next visit if we need to intensify tx but cost is an issue.  - will RTC in 1 mo for a HbA1c check and in 3 mo for an appt - again advised for ReliOn meter and strips >> cheaper

## 2013-09-18 ENCOUNTER — Other Ambulatory Visit: Payer: Self-pay | Admitting: Internal Medicine

## 2013-10-19 ENCOUNTER — Other Ambulatory Visit: Payer: Self-pay | Admitting: Internal Medicine

## 2013-10-30 ENCOUNTER — Other Ambulatory Visit (INDEPENDENT_AMBULATORY_CARE_PROVIDER_SITE_OTHER): Payer: Commercial Managed Care - PPO

## 2013-10-30 DIAGNOSIS — Z0000 Encounter for general adult medical examination without abnormal findings: Secondary | ICD-10-CM

## 2013-10-30 LAB — CBC WITH DIFFERENTIAL/PLATELET
BASOS PCT: 0.4 % (ref 0.0–3.0)
Basophils Absolute: 0 10*3/uL (ref 0.0–0.1)
EOS ABS: 0.3 10*3/uL (ref 0.0–0.7)
Eosinophils Relative: 3.4 % (ref 0.0–5.0)
HCT: 36.1 % (ref 36.0–46.0)
HEMOGLOBIN: 11.9 g/dL — AB (ref 12.0–15.0)
LYMPHS PCT: 40.4 % (ref 12.0–46.0)
Lymphs Abs: 3.7 10*3/uL (ref 0.7–4.0)
MCHC: 33 g/dL (ref 30.0–36.0)
MCV: 93.5 fl (ref 78.0–100.0)
MONO ABS: 0.6 10*3/uL (ref 0.1–1.0)
Monocytes Relative: 7 % (ref 3.0–12.0)
NEUTROS ABS: 4.5 10*3/uL (ref 1.4–7.7)
Neutrophils Relative %: 48.8 % (ref 43.0–77.0)
Platelets: 263 10*3/uL (ref 150.0–400.0)
RBC: 3.86 Mil/uL — AB (ref 3.87–5.11)
RDW: 13 % (ref 11.5–15.5)
WBC: 9.2 10*3/uL (ref 4.0–10.5)

## 2013-10-30 LAB — MICROALBUMIN / CREATININE URINE RATIO
Creatinine,U: 140.9 mg/dL
MICROALB UR: 0.5 mg/dL (ref 0.0–1.9)
Microalb Creat Ratio: 0.4 mg/g (ref 0.0–30.0)

## 2013-10-30 LAB — HEPATIC FUNCTION PANEL
ALBUMIN: 3.5 g/dL (ref 3.5–5.2)
ALT: 18 U/L (ref 0–35)
AST: 18 U/L (ref 0–37)
Alkaline Phosphatase: 61 U/L (ref 39–117)
Bilirubin, Direct: 0 mg/dL (ref 0.0–0.3)
TOTAL PROTEIN: 7.1 g/dL (ref 6.0–8.3)
Total Bilirubin: 0.1 mg/dL — ABNORMAL LOW (ref 0.2–1.2)

## 2013-10-30 LAB — LIPID PANEL
CHOL/HDL RATIO: 6
Cholesterol: 208 mg/dL — ABNORMAL HIGH (ref 0–200)
HDL: 36.8 mg/dL — AB (ref >39.00)
LDL Cholesterol: 138 mg/dL — ABNORMAL HIGH (ref 0–99)
NonHDL: 171.2
TRIGLYCERIDES: 165 mg/dL — AB (ref 0.0–149.0)
VLDL: 33 mg/dL (ref 0.0–40.0)

## 2013-10-30 LAB — HEMOGLOBIN A1C: HEMOGLOBIN A1C: 7 % — AB (ref 4.6–6.5)

## 2013-10-30 LAB — BASIC METABOLIC PANEL
BUN: 12 mg/dL (ref 6–23)
CHLORIDE: 105 meq/L (ref 96–112)
CO2: 25 mEq/L (ref 19–32)
Calcium: 9.1 mg/dL (ref 8.4–10.5)
Creatinine, Ser: 0.7 mg/dL (ref 0.4–1.2)
GFR: 121.22 mL/min (ref >60.00)
Glucose, Bld: 154 mg/dL — ABNORMAL HIGH (ref 70–99)
Potassium: 4.4 mEq/L (ref 3.5–5.1)
SODIUM: 136 meq/L (ref 135–145)

## 2013-10-30 LAB — POCT URINALYSIS DIPSTICK
Bilirubin, UA: NEGATIVE
GLUCOSE UA: NEGATIVE
Ketones, UA: NEGATIVE
Leukocytes, UA: NEGATIVE
Nitrite, UA: NEGATIVE
PROTEIN UA: NEGATIVE
RBC UA: NEGATIVE
SPEC GRAV UA: 1.02
UROBILINOGEN UA: 0.2
pH, UA: 5

## 2013-10-30 LAB — TSH: TSH: 0.37 u[IU]/mL (ref 0.35–4.50)

## 2013-11-06 ENCOUNTER — Ambulatory Visit (INDEPENDENT_AMBULATORY_CARE_PROVIDER_SITE_OTHER): Payer: Commercial Managed Care - PPO | Admitting: Family

## 2013-11-06 ENCOUNTER — Encounter: Payer: Self-pay | Admitting: Family

## 2013-11-06 VITALS — BP 100/64 | HR 67 | Ht 65.5 in | Wt 167.0 lb

## 2013-11-06 DIAGNOSIS — B353 Tinea pedis: Secondary | ICD-10-CM

## 2013-11-06 DIAGNOSIS — Z0000 Encounter for general adult medical examination without abnormal findings: Secondary | ICD-10-CM

## 2013-11-06 DIAGNOSIS — E1165 Type 2 diabetes mellitus with hyperglycemia: Secondary | ICD-10-CM

## 2013-11-06 DIAGNOSIS — IMO0001 Reserved for inherently not codable concepts without codable children: Secondary | ICD-10-CM

## 2013-11-06 NOTE — Progress Notes (Signed)
Pre visit review using our clinic review tool, if applicable. No additional management support is needed unless otherwise documented below in the visit note. 

## 2013-11-06 NOTE — Progress Notes (Signed)
Subjective:    Patient ID: Denise Beard, female    DOB: [birth year], 53 y.o.   MRN: [id number]  HPI  53 yo AA nonsmoker female presents for physical exam. She has no acute issues, pain or concerns today.  Labs from 10/30/13 were reviewed.  She is uninterested in starting a statin for hyperlipidemia. She would like to try TLC for 6 months and determine if weight or dietary changes will improve her cholesterol.   T2DM controlled by Januvia, glipizide, metformin. She specifically denies hypoglycemic events.   Review of Systems  Constitutional: Negative.   HENT: Negative.   Eyes: Negative.   Respiratory: Negative.   Cardiovascular: Negative.   Gastrointestinal: Negative.   Endocrine: Negative.   Genitourinary: Negative.   Musculoskeletal:       Bilateral leg swelling - no A/C in office this week. Has increased water intake in last several days.   Skin: Negative.   Allergic/Immunologic: Negative.   Neurological: Negative.   Hematological: Negative.   Psychiatric/Behavioral: Negative.    Past Medical History  Diagnosis Date  . DIAB W/O COMP TYPE II/UNS NOT STATED UNCNTRL 04/06/2009  . ANXIETY 03/17/2007  . ASTHMA 06/26/2007  . GERD 06/26/2007  . CONSTIPATION, CHRONIC 02/27/2010  . Irritable bowel syndrome 02/27/2010  . UNSPEC POLYARTHROPATHY/POLYARTHRIT MX SITES 12/13/2009  . SEIZURE DISORDER 04/17/2010    History   Social History  . Marital Status: Divorced    Spouse Name: N/A    Number of Children: N/A  . Years of Education: N/A   Occupational History  . Not on file.   Social History Main Topics  . Smoking status: Never Smoker   . Smokeless tobacco: Never Used  . Alcohol Use: No  . Drug Use: No  . Sexual Activity: Not on file   Other Topics Concern  . Not on file   Social History Narrative   Single   2 chldren   Works; Nurse, mental health for background checks     Past Surgical History  Procedure Laterality Date  . Cesarean section    . Tubal  ligation    . Abdominal hysterectomy      Family History  Problem Relation Age of Onset  . Thyroid disease Mother   . Cancer Mother   . Hyperlipidemia Mother   . Cancer Maternal Grandfather     Allergies  Allergen Reactions  . Iodine     Current Outpatient Prescriptions on File Prior to Visit  Medication Sig Dispense Refill  . budesonide-formoterol (SYMBICORT) 80-4.5 MCG/ACT inhaler Inhale 2 puffs into the lungs 2 (two) times daily.        Marland Kitchen glipiZIDE (GLIPIZIDE XL) 5 MG 24 hr tablet Take 1 tablet (5 mg total) by mouth daily with breakfast.  30 tablet  3  . ipratropium (ATROVENT) 0.06 % nasal spray Place 2 sprays into the nose 3 (three) times daily.  15 mL  0  . metFORMIN (GLUCOPHAGE) 1000 MG tablet Take 1 tablet (1,000 mg total) by mouth 2 (two) times daily with a meal.  180 tablet  3  . ONE TOUCH ULTRA TEST test strip USE AS DIRECTED  100 each  6  . sitaGLIPtin (JANUVIA) 100 MG tablet Take 1 tablet (100 mg total) by mouth daily.  30 tablet  11  . zolpidem (AMBIEN) 10 MG tablet TAKE 1 TABLET BY MOUTH AT BEDTIME AS NEEDED  30 tablet  5   No current facility-administered medications on file prior to visit.    BP 100/64  Pulse 67  Ht 5' 5.5" (1.664 m)  Wt 167 lb (75.751 kg)  BMI 27.36 kg/m2    Objective:   Physical Exam  Constitutional: She is oriented to person, place, and time. She appears well-developed and well-nourished.  HENT:  Head: Normocephalic and atraumatic.  Right Ear: External ear normal.  Left Ear: External ear normal.  Mouth/Throat: Oropharynx is clear and moist. No oropharyngeal exudate.  Eyes: EOM are normal. Pupils are equal, round, and reactive to light. Right eye exhibits no discharge. Left eye exhibits no discharge.  Neck: Normal range of motion. Neck supple. No thyromegaly present.  Cardiovascular: Normal rate, regular rhythm and normal heart sounds.  Exam reveals no friction rub.   No murmur heard. Pulmonary/Chest: Effort normal and breath sounds  normal.  Abdominal: Soft. Bowel sounds are normal.  Musculoskeletal: Normal range of motion. She exhibits no edema.  Neurological: She is alert and oriented to person, place, and time.  Skin: Skin is warm and dry.  Psychiatric: She has a normal mood and affect. Her behavior is normal. Judgment and thought content normal.      Assessment & Plan:  Denise Beard was seen today for annual exam.  Diagnoses and associated orders for this visit:  Tinea pedis of both feet  Type II or unspecified type diabetes mellitus without mention of complication, uncontrolled - Hemoglobin A1c; Future  Preventative health care - Ambulatory referral to Gastroenterology    Watch TSH trending. Advised on TLC regarding diet and exercise. Avoid high fructose corn syrups, processed sugars, white breads and heavy starches.

## 2013-11-06 NOTE — Patient Instructions (Signed)
Cholesterol Cholesterol is a white, waxy, fat-like protein needed by your body in small amounts. The liver makes all the cholesterol you need. Cholesterol is carried from the liver by the blood through the blood vessels. Deposits of cholesterol (plaque) may build up on blood vessel walls. These make the arteries narrower and stiffer. Cholesterol plaques increase the risk for heart attack and stroke.  You cannot feel your cholesterol level even if it is very high. The only way to know it is high is with a blood test. Once you know your cholesterol levels, you should keep a record of the test results. Work with your health care provider to keep your levels in the desired range.  WHAT DO THE RESULTS MEAN?  Total cholesterol is a rough measure of all the cholesterol in your blood.   LDL is the so-called bad cholesterol. This is the type that deposits cholesterol in the walls of the arteries. You want this level to be low.   HDL is the good cholesterol because it cleans the arteries and carries the LDL away. You want this level to be high.  Triglycerides are fat that the body can either burn for energy or store. High levels are closely linked to heart disease.  WHAT ARE THE DESIRED LEVELS OF CHOLESTEROL?  Total cholesterol below 200.   LDL below 100 for people at risk, below 70 for those at very high risk.   HDL above 50 is good, above 60 is best.   Triglycerides below 150.  HOW CAN I LOWER MY CHOLESTEROL?  Diet. Follow your diet programs as directed by your health care provider.   Choose fish or white meat chicken and [place], roasted or baked. Limit fatty cuts of red meat, fried foods, and processed meats, such as sausage and lunch meats.   Eat lots of fresh fruits and vegetables.  Choose whole grains, beans, pasta, potatoes, and cereals.   Use only small amounts of olive, corn, or canola oils.   Avoid butter, mayonnaise, shortening, or palm kernel oils.  Avoid foods with  trans fats.   Drink skim or nonfat milk and eat low-fat or nonfat yogurt and cheeses. Avoid whole milk, cream, ice cream, egg yolks, and full-fat cheeses.   Healthy desserts include angel food cake, ginger snaps, animal crackers, hard candy, popsicles, and low-fat or nonfat frozen yogurt. Avoid pastries, cakes, pies, and cookies.   Exercise. Follow your exercise programs as directed by your health care provider.   A regular program helps decrease LDL and raise HDL.   A regular program helps with weight control.   Do things that increase your activity level like gardening, walking, or taking the stairs. Ask your health care provider about how you can be more active in your daily life.   Medicine. Take medicine as directed by your health care provider.   Medicine may be prescribed by your health care provider to help lower cholesterol and decrease the risk for heart disease.   If you have several risk factors, you may need medicine even if your levels are normal. Document Released: 01/30/2001 Document Revised: 05/12/2013 Document Reviewed: 02/18/2013 [facility] Patient Information 2015 [place], [place]. This information is not intended to replace advice given to you by your health care provider. Make sure you discuss any questions you have with your health care provider.   Triglycerides, TG, TRIG This is a test to check your risk of developing heart disease. It is often done as part of a lipid profile during a  regular medical exam or if you are being treated for high triglycerides. This test measures the amount of triglycerides in your blood. Triglycerides are the body's storage form for fat. Most triglycerides are found in fat tissue. Some triglycerides circulate in the blood to provide fuel for muscles to work. Extra triglycerides are found in the blood after eating a meal when fat is being sent from the gut to fat tissue for storage. The test for triglycerides should be done when you  are fasting and no extra triglycerides from a recent meal are present.  SAMPLE COLLECTION The test for triglycerides uses a blood sample. Most often, the blood sample is collected using a needle to collect blood from a vein. Sometimes triglycerides are measured using a drop of blood collected by puncturing the skin on a finger. Testing should be done when you are fasting. For 12 to 14 hours before the test, only water is permitted. In addition, alcohol should not be consumed for the 24 hours just before the test. If you are diabetic and your blood sugar is out of control, triglycerides will be very high. NORMAL FINDINGS  Adult/elderly  Female: 40-160 mg/dL or 0.45-1.81 mmol/L (SI units)  Female: 35-135 mg/dL or 0.40-1.52 mmol/L (SI units)  0-51 years  Female: 30-86 mg/dL  Female: 32-99 mg/dL  6-51 years  Female: 31-108 mg/dL  Female: 35-114 mg/dL  12-51 years  Female: 36-138 mg/dL  Female: 41-138 mg/dL  16-51 years  Female: 40-163 mg/dL  Female: 40-128 mg/dL Ranges for normal findings may vary among different laboratories and hospitals. You should always check with your doctor after having lab work or other tests done to discuss the meaning of your test results and whether your values are considered within normal limits. MEANING OF TEST  Your caregiver will go over the test results with you and discuss the importance and meaning of your results, as well as treatment options and the need for additional tests if necessary. OBTAINING THE TEST RESULTS It is your responsibility to obtain your test results. Ask the lab or department performing the test when and how you will get your results. Document Released: 06/09/2004 Document Revised: 07/30/2011 Document Reviewed: 04/18/2008 [facility] Patient Information 2015 [place], [place]. This information is not intended to replace advice given to you by your health care provider. Make sure you discuss any questions you have with your health care  provider.

## 2013-12-13 ENCOUNTER — Other Ambulatory Visit: Payer: Self-pay | Admitting: Internal Medicine

## 2013-12-14 ENCOUNTER — Encounter: Payer: Self-pay | Admitting: Family

## 2014-01-18 ENCOUNTER — Ambulatory Visit: Payer: Commercial Managed Care - PPO | Admitting: Internal Medicine

## 2014-02-05 ENCOUNTER — Ambulatory Visit: Payer: Commercial Managed Care - PPO | Admitting: Family

## 2014-02-11 ENCOUNTER — Ambulatory Visit: Payer: Commercial Managed Care - PPO | Admitting: Family

## 2014-02-26 ENCOUNTER — Ambulatory Visit (INDEPENDENT_AMBULATORY_CARE_PROVIDER_SITE_OTHER): Payer: Commercial Managed Care - PPO | Admitting: Family

## 2014-02-26 ENCOUNTER — Encounter: Payer: Self-pay | Admitting: Family

## 2014-02-26 VITALS — BP 100/80 | HR 84 | Wt 161.0 lb

## 2014-02-26 DIAGNOSIS — E119 Type 2 diabetes mellitus without complications: Secondary | ICD-10-CM

## 2014-02-26 DIAGNOSIS — J4540 Moderate persistent asthma, uncomplicated: Secondary | ICD-10-CM

## 2014-02-26 DIAGNOSIS — K5900 Constipation, unspecified: Secondary | ICD-10-CM

## 2014-02-26 DIAGNOSIS — E7800 Pure hypercholesterolemia, unspecified: Secondary | ICD-10-CM

## 2014-02-26 DIAGNOSIS — K5909 Other constipation: Secondary | ICD-10-CM

## 2014-02-26 LAB — HEPATIC FUNCTION PANEL
ALT: 16 U/L (ref 0–35)
AST: 18 U/L (ref 0–37)
Albumin: 3.5 g/dL (ref 3.5–5.2)
Alkaline Phosphatase: 70 U/L (ref 39–117)
BILIRUBIN DIRECT: 0 mg/dL (ref 0.0–0.3)
TOTAL PROTEIN: 8.1 g/dL (ref 6.0–8.3)
Total Bilirubin: 0.4 mg/dL (ref 0.2–1.2)

## 2014-02-26 LAB — BASIC METABOLIC PANEL
BUN: 7 mg/dL (ref 6–23)
CHLORIDE: 107 meq/L (ref 96–112)
CO2: 25 meq/L (ref 19–32)
Calcium: 9.8 mg/dL (ref 8.4–10.5)
Creatinine, Ser: 0.6 mg/dL (ref 0.4–1.2)
GFR: 137.79 mL/min (ref >60.00)
Glucose, Bld: 178 mg/dL — ABNORMAL HIGH (ref 70–99)
Potassium: 4.1 mEq/L (ref 3.5–5.1)
Sodium: 138 mEq/L (ref 135–145)

## 2014-02-26 LAB — LIPID PANEL
Cholesterol: 247 mg/dL — ABNORMAL HIGH (ref 0–200)
HDL: 33 mg/dL — AB (ref >39.00)
LDL Cholesterol: 190 mg/dL — ABNORMAL HIGH (ref 0–99)
NonHDL: 214
Total CHOL/HDL Ratio: 7
Triglycerides: 120 mg/dL (ref 0.0–149.0)
VLDL: 24 mg/dL (ref 0.0–40.0)

## 2014-02-26 LAB — HEMOGLOBIN A1C: HEMOGLOBIN A1C: 6.7 % — AB (ref 4.6–6.5)

## 2014-02-26 NOTE — Progress Notes (Signed)
Subjective:    Patient ID: Denise Beard, female    DOB: 01-27-1961, 53 y.o.   MRN: [id number]  HPI 53 year old Latin American female, is in today for recheck of type 2 diabetes, asthma, chronic constipation, and hyperlipidemia. Reports being stable. Last diabetic eye exam was 09/07/2013. Unsure of last mammogram but patient will schedule that appointment. Not currently exercising.   Review of Systems  Constitutional: Negative.   HENT: Negative.   Eyes: Negative for photophobia and visual disturbance.  Respiratory: Negative.   Cardiovascular: Negative.   Gastrointestinal: Negative.   Endocrine: Negative.   Genitourinary: Negative.   Musculoskeletal: Negative.   Skin: Negative.   Allergic/Immunologic: Negative.   Neurological: Negative.   Hematological: Negative.   Psychiatric/Behavioral: Negative.    Past Medical History  Diagnosis Date  . DIAB W/O COMP TYPE II/UNS NOT STATED UNCNTRL 04/06/2009  . ANXIETY 03/17/2007  . ASTHMA 06/26/2007  . GERD 06/26/2007  . CONSTIPATION, CHRONIC 02/27/2010  . Irritable bowel syndrome 02/27/2010  . UNSPEC POLYARTHROPATHY/POLYARTHRIT MX SITES 12/13/2009  . SEIZURE DISORDER 04/17/2010    History   Social History  . Marital Status: Divorced    Spouse Name: N/A    Number of Children: N/A  . Years of Education: N/A   Occupational History  . Not on file.   Social History Main Topics  . Smoking status: Never Smoker   . Smokeless tobacco: Never Used  . Alcohol Use: No  . Drug Use: No  . Sexual Activity: Not on file   Other Topics Concern  . Not on file   Social History Narrative   Single   2 chldren   Works; Nurse, mental health for background checks     Past Surgical History  Procedure Laterality Date  . Cesarean section    . Tubal ligation    . Abdominal hysterectomy      Family History  Problem Relation Age of Onset  . Thyroid disease Mother   . Cancer Mother   . Hyperlipidemia Mother   . Cancer Maternal  Grandfather     Allergies  Allergen Reactions  . Iodine     Current Outpatient Prescriptions on File Prior to Visit  Medication Sig Dispense Refill  . budesonide-formoterol (SYMBICORT) 80-4.5 MCG/ACT inhaler Inhale 2 puffs into the lungs 2 (two) times daily.        Marland Kitchen glipiZIDE (GLUCOTROL XL) 5 MG 24 hr tablet TAKE 1 TABLET (5 MG TOTAL) BY MOUTH DAILY WITH BREAKFAST.  30 tablet  3  . ipratropium (ATROVENT) 0.06 % nasal spray Place 2 sprays into the nose 3 (three) times daily.  15 mL  0  . metFORMIN (GLUCOPHAGE) 1000 MG tablet Take 1 tablet (1,000 mg total) by mouth 2 (two) times daily with a meal.  180 tablet  3  . ONE TOUCH ULTRA TEST test strip USE AS DIRECTED  100 each  6  . sitaGLIPtin (JANUVIA) 100 MG tablet Take 1 tablet (100 mg total) by mouth daily.  30 tablet  11  . zolpidem (AMBIEN) 10 MG tablet TAKE 1 TABLET BY MOUTH AT BEDTIME AS NEEDED  30 tablet  5   No current facility-administered medications on file prior to visit.    BP 100/80  Pulse 84  Wt 161 lb (73.029 kg)chart    Objective:   Physical Exam  Constitutional: She is oriented to person, place, and time. She appears well-developed and well-nourished.  HENT:  Right Ear: External ear normal.  Left Ear: External ear normal.  Nose: Nose normal.  Mouth/Throat: Oropharynx is clear and moist.  Eyes: Pupils are equal, round, and reactive to light.  Neck: Normal range of motion. Neck supple. No thyromegaly present.  Cardiovascular: Normal rate, regular rhythm and normal heart sounds.   Pulmonary/Chest: Effort normal and breath sounds normal.  Abdominal: Soft. Bowel sounds are normal.  Musculoskeletal: Normal range of motion.  Neurological: She is alert and oriented to person, place, and time.  Skin: Skin is warm and dry.  Psychiatric: She has a normal mood and affect.          Assessment & Plan:  Denise Beard was seen today for follow-up.  Diagnoses and associated orders for this visit:  Type 2 diabetes mellitus  without complication - Hemoglobin B0J - Basic Metabolic Panel - Hepatic Function Panel  Asthma, chronic, moderate persistent, uncomplicated  Pure hypercholesterolemia - Lipid Panel - Basic Metabolic Panel - Hepatic Function Panel  Chronic constipation    call the office if any questions or concerns. Recheck in 4 months and sooner as needed.

## 2014-02-26 NOTE — Progress Notes (Signed)
Pre visit review using our clinic review tool, if applicable. No additional management support is needed unless otherwise documented below in the visit note. 

## 2014-02-26 NOTE — Patient Instructions (Signed)
Diabetes and Exercise Exercising regularly is important. It is not just about losing weight. It has many health benefits, such as:  Improving your overall fitness, flexibility, and endurance.  Increasing your bone density.  Helping with weight control.  Decreasing your body fat.  Increasing your muscle strength.  Reducing stress and tension.  Improving your overall health. People with diabetes who exercise gain additional benefits because exercise:  Reduces appetite.  Improves the body's use of blood sugar (glucose).  Helps lower or control blood glucose.  Decreases blood pressure.  Helps control blood lipids (such as cholesterol and triglycerides).  Improves the body's use of the hormone insulin by:  Increasing the body's insulin sensitivity.  Reducing the body's insulin needs.  Decreases the risk for heart disease because exercising:  Lowers cholesterol and triglycerides levels.  Increases the levels of good cholesterol (such as high-density lipoproteins [HDL]) in the body.  Lowers blood glucose levels. YOUR ACTIVITY PLAN  Choose an activity that you enjoy and set realistic goals. Your health care provider or diabetes educator can help you make an activity plan that works for you. Exercise regularly as directed by your health care provider. This includes:  Performing resistance training twice a week such as push-ups, sit-ups, lifting weights, or using resistance bands.  Performing 150 minutes of cardio exercises each week such as walking, running, or playing sports.  Staying active and spending no more than 90 minutes at one time being inactive. Even short bursts of exercise are good for you. Three 10-minute sessions spread throughout the day are just as beneficial as a single 30-minute session. Some exercise ideas include:  Taking the dog for a walk.  Taking the stairs instead of the elevator.  Dancing to your favorite song.  Doing an exercise  video.  Doing your favorite exercise with a friend. RECOMMENDATIONS FOR EXERCISING WITH TYPE 1 OR TYPE 2 DIABETES   Check your blood glucose before exercising. If blood glucose levels are greater than 240 mg/dL, check for urine ketones. Do not exercise if ketones are present.  Avoid injecting insulin into areas of the body that are going to be exercised. For example, avoid injecting insulin into:  The arms when playing tennis.  The legs when jogging.  Keep a record of:  Food intake before and after you exercise.  Expected peak times of insulin action.  Blood glucose levels before and after you exercise.  The type and amount of exercise you have done.  Review your records with your health care provider. Your health care provider will help you to develop guidelines for adjusting food intake and insulin amounts before and after exercising.  If you take insulin or oral hypoglycemic agents, watch for signs and symptoms of hypoglycemia. They include:  Dizziness.  Shaking.  Sweating.  Chills.  Confusion.  Drink plenty of water while you exercise to prevent dehydration or heat stroke. Body water is lost during exercise and must be replaced.  Talk to your health care provider before starting an exercise program to make sure it is safe for you. Remember, almost any type of activity is better than none. Document Released: 07/28/2003 Document Revised: 09/21/2013 Document Reviewed: 10/14/2012 [employer] Patient Information 2015 [employer]. This information is not intended to replace advice given to you by your health care provider. Make sure you discuss any questions you have with your health care provider.  

## 2014-03-01 ENCOUNTER — Other Ambulatory Visit: Payer: Self-pay | Admitting: Family

## 2014-03-01 MED ORDER — SIMVASTATIN 20 MG PO TABS: 20.0000 mg | ORAL_TABLET | Freq: Every day | ORAL | Status: DC

## 2014-03-05 ENCOUNTER — Encounter: Payer: Self-pay | Admitting: Internal Medicine

## 2014-03-05 ENCOUNTER — Ambulatory Visit (INDEPENDENT_AMBULATORY_CARE_PROVIDER_SITE_OTHER): Payer: Commercial Managed Care - PPO | Admitting: Internal Medicine

## 2014-03-05 VITALS — BP 104/62 | HR 76 | Temp 98.2°F | Resp 12 | Wt 165.0 lb

## 2014-03-05 DIAGNOSIS — E119 Type 2 diabetes mellitus without complications: Secondary | ICD-10-CM

## 2014-03-05 NOTE — Progress Notes (Signed)
Subjective:     Patient ID: Denise Beard, female   DOB: [birth year], 53 y.o.   MRN: [id number]  HPI Denise Beard is a pleasant 53 y.o. woman, returning for f/u for DM 2, dx 2012, non-insulin-dependent, uncontrolled, without complications. Last visit 6 mo ago.  Her last hemoglobin A1c: Lab Results  Component Value Date   HGBA1C 6.7* 02/26/2014   HGBA1C 7.0* 10/30/2013   HGBA1C 7.5* 07/17/2013  Previously 7.9% (02/18/2012).   She is on:  - Januvia 100 in am - started in 09/2012 - metformin 1000 mg bid - glipizide XL 5 mg a day She was on saxagliptin/metformin XR 09/998 mg (Kombyglyze) in the past. She started on on JanuMet 50/1000 bid on 07/17/2013 >> lightheaded, HA, blurry vision.  She checks 2-3x a week - brings meter - few checks, sugars same as before: - am: 133-179 >>170-190 (lowest 138 >> felt dizzy) >> 150-168 >> 133-194 >> 115-154 >> 114-150 - 2h after b'fast: 189-288 >> n/c >> 180, 263 (checks only when feels bad) - before lunch: one value: 179 >> 150 >> n/c >> 177, 191 >> 153 >> n/c - 2h after lunch: 120, 136, 169, 211 >> n/c >> 135 - before dinner: 160 >> n/c >> 190 >> n/c >> 130, 153, 169 >> 113 - bedtime: 109-164 >> not checking >> 130-140 >> 152, 164, 233 >> 174 >> 217 Strips are expensive. Meter: One Touch Ultra 2 Highest sugar 262. Lowest 114.  She had nutrition classes with [employer] (nutrition) in the past.   - Last eye exam 09/07/2013. No DR. ([facility]) - no CKD; no neuropathy. ACR was 1.9 on 10/06/2012. Lab Results  Component Value Date   BUN 7 02/26/2014   CREATININE 0.6 02/26/2014   - Lipids:  Lab Results  Component Value Date   CHOL 247* 02/26/2014   HDL 33.00* 02/26/2014   LDLCALC 190* 02/26/2014   LDLDIRECT 179.6 07/17/2013   TRIG 120.0 02/26/2014   CHOLHDL 7 02/26/2014  She did not want to start on a statin, say she wanted to work on diet and exercise.  She also has a history of anxiety; asthma; GERD; constipation;  polyarthropathy; seizure disorder.   Review of Systems Constitutional: no weight gain or  loss, no fatigue, no subjective hyperthermia/hypothermia Eyes: no blurry vision, no xerophthalmia ENT: no sore throat, no nodules palpated in throat, no dysphagia/odynophagia, no hoarseness Cardiovascular: no CP/SOB/palpitations/leg swelling Respiratory: no cough/SOB/wheezing Gastrointestinal: no N/V/D/C Musculoskeletal: no muscle/joint aches Skin: no rashes Neurological: no tremors/numbness/tingling/+ dizziness, + HA - both with JanuMet  I reviewed pt's medications, allergies, PMH, social hx, family hx and no changes required, except as above.  Objective:   Physical Exam  BP 104/62  Pulse 76  Temp(Src) 98.2 F (36.8 C) (Oral)  Resp 12  Wt 165 lb (74.844 kg)  SpO2 97% Body mass index is 27.03 kg/(m^2).  Wt Readings from Last 3 Encounters:  02/26/14 161 lb (73.029 kg)  11/06/13 167 lb (75.751 kg)  09/10/13 160 lb (72.576 kg)  Constitutional: slightly overweight, in NAD Eyes: PERRLA, EOMI, no exophthalmos ENT: moist mucous membranes, no thyromegaly, no cervical lymphadenopathy Cardiovascular: RRR, No MRG Respiratory: CTA B Gastrointestinal: abdomen soft, NT, ND, BS+ Skin: moist, warm Neuro: DTR normal in all 4  Assessment:     1. DM2, non-insulin-dependent, uncontrolled, without complications    Plan:     Pt with improved sugars after stopping after adding Glipizide XL 5 to Januvia +  Metformin , but she does not have many checks - I advised her to continue the same regimen for now as her HbA1c is great, but start checking sugars once a day Patient Instructions  Please continue: - Januvia 100 in am  - metformin 1000 mg 2x a day - glipizide XL 5 mg a day Please come back for a follow-up appointment in 4 months - check sugars 1x a day, rotating checks. - Invokana is an option at next visit if we need to intensify tx but cost is an issue.  - UTD with eye exams - will have the flu  shot soon at work. - will RTC in 4 mo

## 2014-03-05 NOTE — Patient Instructions (Signed)
Please continue: - Januvia 100 in am  - metformin 1000 mg 2x a day - glipizide XL 5 mg a day Please come back for a follow-up appointment in 4 months - check sugars 1x a day, rotating checks.

## 2014-04-20 ENCOUNTER — Other Ambulatory Visit: Payer: Self-pay

## 2014-04-20 ENCOUNTER — Telehealth: Payer: Self-pay

## 2014-04-20 MED ORDER — ZOLPIDEM TARTRATE 10 MG PO TABS: 10.0000 mg | ORAL_TABLET | Freq: Every evening | ORAL | Status: DC | PRN

## 2014-04-20 MED ORDER — SITAGLIPTIN PHOSPHATE 100 MG PO TABS: 100.0000 mg | ORAL_TABLET | Freq: Every day | ORAL | Status: DC

## 2014-04-20 NOTE — Telephone Encounter (Signed)
Phoned in.

## 2014-04-20 NOTE — Telephone Encounter (Signed)
Ok to fill 

## 2014-04-20 NOTE — Telephone Encounter (Signed)
Last filled by Dr. Arnoldo Morale 10/20/13 for #30 with 5 refills. Ok to fill?

## 2014-04-20 NOTE — Telephone Encounter (Signed)
Rx request for Zolpidem Tartrate 10 mg tablet- Take 1 tablet by mouth at bedtime as needed.  Pls send to [facility] [address]

## 2014-04-20 NOTE — Telephone Encounter (Signed)
Rx request for Januvia 100 mg tablet- Take 1 tablet by mouth daily. Rx sent to pharmacy.

## 2014-04-23 ENCOUNTER — Other Ambulatory Visit: Payer: Self-pay | Admitting: Internal Medicine

## 2014-06-18 ENCOUNTER — Other Ambulatory Visit: Payer: Self-pay | Admitting: Internal Medicine

## 2014-07-02 ENCOUNTER — Encounter: Payer: Self-pay | Admitting: Family

## 2014-07-02 ENCOUNTER — Ambulatory Visit (INDEPENDENT_AMBULATORY_CARE_PROVIDER_SITE_OTHER): Payer: Commercial Managed Care - PPO | Admitting: Family

## 2014-07-02 VITALS — BP 104/62 | Temp 98.1°F | Wt 172.6 lb

## 2014-07-02 DIAGNOSIS — E119 Type 2 diabetes mellitus without complications: Secondary | ICD-10-CM

## 2014-07-02 DIAGNOSIS — E7800 Pure hypercholesterolemia, unspecified: Secondary | ICD-10-CM

## 2014-07-02 DIAGNOSIS — G4700 Insomnia, unspecified: Secondary | ICD-10-CM

## 2014-07-02 NOTE — Patient Instructions (Signed)
Diabetes and Exercise Exercising regularly is important. It is not just about losing weight. It has many health benefits, such as:  Improving your overall fitness, flexibility, and endurance.  Increasing your bone density.  Helping with weight control.  Decreasing your body fat.  Increasing your muscle strength.  Reducing stress and tension.  Improving your overall health. People with diabetes who exercise gain additional benefits because exercise:  Reduces appetite.  Improves the body's use of blood sugar (glucose).  Helps lower or control blood glucose.  Decreases blood pressure.  Helps control blood lipids (such as cholesterol and triglycerides).  Improves the body's use of the hormone insulin by:  Increasing the body's insulin sensitivity.  Reducing the body's insulin needs.  Decreases the risk for heart disease because exercising:  Lowers cholesterol and triglycerides levels.  Increases the levels of good cholesterol (such as high-density lipoproteins [HDL]) in the body.  Lowers blood glucose levels. YOUR ACTIVITY PLAN  Choose an activity that you enjoy and set realistic goals. Your health care provider or diabetes educator can help you make an activity plan that works for you. Exercise regularly as directed by your health care provider. This includes:  Performing resistance training twice a week such as push-ups, sit-ups, lifting weights, or using resistance bands.  Performing 150 minutes of cardio exercises each week such as walking, running, or playing sports.  Staying active and spending no more than 90 minutes at one time being inactive. Even short bursts of exercise are good for you. Three 10-minute sessions spread throughout the day are just as beneficial as a single 30-minute session. Some exercise ideas include:  Taking the dog for a walk.  Taking the stairs instead of the elevator.  Dancing to your favorite song.  Doing an exercise  video.  Doing your favorite exercise with a friend. RECOMMENDATIONS FOR EXERCISING WITH TYPE 1 OR TYPE 2 DIABETES   Check your blood glucose before exercising. If blood glucose levels are greater than 240 mg/dL, check for urine ketones. Do not exercise if ketones are present.  Avoid injecting insulin into areas of the body that are going to be exercised. For example, avoid injecting insulin into:  The arms when playing tennis.  The legs when jogging.  Keep a record of:  Food intake before and after you exercise.  Expected peak times of insulin action.  Blood glucose levels before and after you exercise.  The type and amount of exercise you have done.  Review your records with your health care provider. Your health care provider will help you to develop guidelines for adjusting food intake and insulin amounts before and after exercising.  If you take insulin or oral hypoglycemic agents, watch for signs and symptoms of hypoglycemia. They include:  Dizziness.  Shaking.  Sweating.  Chills.  Confusion.  Drink plenty of water while you exercise to prevent dehydration or heat stroke. Body water is lost during exercise and must be replaced.  Talk to your health care provider before starting an exercise program to make sure it is safe for you. Remember, almost any type of activity is better than none. Document Released: 07/28/2003 Document Revised: 09/21/2013 Document Reviewed: 10/14/2012 [employer] Patient Information 2015 [employer]. This information is not intended to replace advice given to you by your health care provider. Make sure you discuss any questions you have with your health care provider.  

## 2014-07-02 NOTE — Progress Notes (Signed)
Pre visit review using our clinic review tool, if applicable. No additional management support is needed unless otherwise documented below in the visit note. 

## 2014-07-02 NOTE — Progress Notes (Signed)
Subjective:    Patient ID: Denise Beard, female    DOB: 1960-09-14, 54 y.o.   MRN: [id number]  HPI 54 year old African-American female, nonsmoker with a history of type 2 diabetes, hyperlipidemia, and insomnia is in today for recheck.  Reports blood glucoses are running higher 2:30 to 280. Has gained about 5-10 pounds since her last office visit. Sees endocrinology next week. Tolerating medications well. Last diabetic eye exam was last year until she is probably due about now. Not currently exercising.  Review of Systems  Constitutional: Negative.   HENT: Negative.   Respiratory: Negative.   Cardiovascular: Negative.   Gastrointestinal: Negative.   Endocrine: Negative.  Negative for polydipsia, polyphagia and polyuria.  Genitourinary: Negative.   Musculoskeletal: Negative.   Skin: Negative.   Neurological: Negative.   Hematological: Negative.   Psychiatric/Behavioral: Negative.    Past Medical History  Diagnosis Date  . DIAB W/O COMP TYPE II/UNS NOT STATED UNCNTRL 04/06/2009  . ANXIETY 03/17/2007  . ASTHMA 06/26/2007  . GERD 06/26/2007  . CONSTIPATION, CHRONIC 02/27/2010  . Irritable bowel syndrome 02/27/2010  . UNSPEC POLYARTHROPATHY/POLYARTHRIT MX SITES 12/13/2009  . SEIZURE DISORDER 04/17/2010    History   Social History  . Marital Status: Divorced    Spouse Name: N/A  . Number of Children: N/A  . Years of Education: N/A   Occupational History  . Not on file.   Social History Main Topics  . Smoking status: Never Smoker   . Smokeless tobacco: Never Used  . Alcohol Use: No  . Drug Use: No  . Sexual Activity: Not on file   Other Topics Concern  . Not on file   Social History Narrative   Single   2 chldren   Works; Nurse, mental health for background checks     Past Surgical History  Procedure Laterality Date  . Cesarean section    . Tubal ligation    . Abdominal hysterectomy      Family History  Problem Relation Age of Onset  . Thyroid disease  Mother   . Cancer Mother   . Hyperlipidemia Mother   . Cancer Maternal Grandfather     Allergies  Allergen Reactions  . Iodine     Current Outpatient Prescriptions on File Prior to Visit  Medication Sig Dispense Refill  . budesonide-formoterol (SYMBICORT) 80-4.5 MCG/ACT inhaler Inhale 2 puffs into the lungs 2 (two) times daily.      Marland Kitchen glipiZIDE (GLUCOTROL XL) 5 MG 24 hr tablet TAKE 1 TABLET (5 MG TOTAL) BY MOUTH DAILY WITH BREAKFAST. 30 tablet 1  . ipratropium (ATROVENT) 0.06 % nasal spray Place 2 sprays into the nose 3 (three) times daily. 15 mL 0  . metFORMIN (GLUCOPHAGE) 1000 MG tablet Take 1 tablet (1,000 mg total) by mouth 2 (two) times daily with a meal. 180 tablet 3  . ONE TOUCH ULTRA TEST test strip USE AS DIRECTED 100 each 6  . simvastatin (ZOCOR) 20 MG tablet Take 1 tablet (20 mg total) by mouth at bedtime. 30 tablet 3  . sitaGLIPtin (JANUVIA) 100 MG tablet Take 1 tablet (100 mg total) by mouth daily. 30 tablet 5  . zolpidem (AMBIEN) 10 MG tablet Take 1 tablet (10 mg total) by mouth at bedtime as needed. 30 tablet 5   No current facility-administered medications on file prior to visit.    BP 104/62 mmHg  Temp(Src) 98.1 F (36.7 C) (Oral)  Wt 172 lb 9.6 oz (78.291 kg)chart    Objective:   Physical  Exam  Constitutional: She is oriented to person, place, and time. She appears well-developed and well-nourished.  HENT:  Right Ear: External ear normal.  Left Ear: External ear normal.  Nose: Nose normal.  Mouth/Throat: Oropharynx is clear and moist.  Eyes: Pupils are equal, round, and reactive to light.  Neck: Normal range of motion. Neck supple. No thyromegaly present.  Cardiovascular: Normal rate, regular rhythm and normal heart sounds.   Pulmonary/Chest: Effort normal and breath sounds normal.  Abdominal: Soft. Bowel sounds are normal.  Musculoskeletal: Normal range of motion.  Neurological: She is alert and oriented to person, place, and time.  Skin: Skin is warm  and dry.  Psychiatric: She has a normal mood and affect.          Assessment & Plan:  Denise Beard was seen today for follow-up.  Diagnoses and all orders for this visit:  Type 2 diabetes mellitus without complication Orders: -     Hemoglobin A1c; Future -     Basic Metabolic Panel; Future -     Hepatic Function Panel; Future  Pure hypercholesterolemia Orders: -     Basic Metabolic Panel; Future -     Hepatic Function Panel; Future -     Lipid Panel; Future  Insomnia   encouraged healthy diet, exercise and weight reduction. Encouraged diabetic eye exam. Aspirin 81 mg daily with medications. Follow-up here with Dr. Yong Channel in 4-6 months and sooner as needed. See endocrinology as scheduled. Fasting labs scheduled

## 2014-07-08 ENCOUNTER — Other Ambulatory Visit (INDEPENDENT_AMBULATORY_CARE_PROVIDER_SITE_OTHER): Payer: Commercial Managed Care - PPO

## 2014-07-08 DIAGNOSIS — IMO0002 Reserved for concepts with insufficient information to code with codable children: Secondary | ICD-10-CM

## 2014-07-08 DIAGNOSIS — E1165 Type 2 diabetes mellitus with hyperglycemia: Secondary | ICD-10-CM

## 2014-07-08 DIAGNOSIS — E7800 Pure hypercholesterolemia, unspecified: Secondary | ICD-10-CM

## 2014-07-08 DIAGNOSIS — E119 Type 2 diabetes mellitus without complications: Secondary | ICD-10-CM

## 2014-07-08 LAB — BASIC METABOLIC PANEL
BUN: 9 mg/dL (ref 6–23)
CALCIUM: 9.5 mg/dL (ref 8.4–10.5)
CO2: 27 meq/L (ref 19–32)
CREATININE: 0.71 mg/dL (ref 0.40–1.20)
Chloride: 105 mEq/L (ref 96–112)
GFR: 111.13 mL/min (ref >60.00)
Glucose, Bld: 226 mg/dL — ABNORMAL HIGH (ref 70–99)
Potassium: 4.1 mEq/L (ref 3.5–5.1)
Sodium: 138 mEq/L (ref 135–145)

## 2014-07-08 LAB — HEMOGLOBIN A1C: Hgb A1c MFr Bld: 8.5 % — ABNORMAL HIGH (ref 4.6–6.5)

## 2014-07-08 LAB — HEPATIC FUNCTION PANEL
ALT: 23 U/L (ref 0–35)
AST: 20 U/L (ref 0–37)
Albumin: 3.9 g/dL (ref 3.5–5.2)
Alkaline Phosphatase: 78 U/L (ref 39–117)
Bilirubin, Direct: 0 mg/dL (ref 0.0–0.3)
Total Bilirubin: 0.4 mg/dL (ref 0.2–1.2)
Total Protein: 7.4 g/dL (ref 6.0–8.3)

## 2014-07-08 LAB — LIPID PANEL
Cholesterol: 158 mg/dL (ref 0–200)
HDL: 37.4 mg/dL — ABNORMAL LOW (ref >39.00)
LDL CALC: 97 mg/dL (ref 0–99)
NonHDL: 120.6
TRIGLYCERIDES: 120 mg/dL (ref 0.0–149.0)
Total CHOL/HDL Ratio: 4
VLDL: 24 mg/dL (ref 0.0–40.0)

## 2014-07-09 ENCOUNTER — Ambulatory Visit (INDEPENDENT_AMBULATORY_CARE_PROVIDER_SITE_OTHER): Payer: Commercial Managed Care - PPO | Admitting: Internal Medicine

## 2014-07-09 ENCOUNTER — Encounter: Payer: Self-pay | Admitting: Internal Medicine

## 2014-07-09 VITALS — BP 108/76 | HR 75 | Temp 98.3°F | Resp 14 | Wt 171.6 lb

## 2014-07-09 DIAGNOSIS — IMO0002 Reserved for concepts with insufficient information to code with codable children: Secondary | ICD-10-CM

## 2014-07-09 DIAGNOSIS — E1165 Type 2 diabetes mellitus with hyperglycemia: Secondary | ICD-10-CM

## 2014-07-09 MED ORDER — INSULIN GLARGINE 100 UNIT/ML SOLOSTAR PEN: 18.0000 [IU] | PEN_INJECTOR | Freq: Every day | SUBCUTANEOUS | Status: DC

## 2014-07-09 MED ORDER — INSULIN PEN NEEDLE 32G X 4 MM MISC: Status: DC

## 2014-07-09 MED ORDER — GLIPIZIDE ER 5 MG PO TB24: ORAL_TABLET | ORAL | Status: DC

## 2014-07-09 MED ORDER — METFORMIN HCL 1000 MG PO TABS: 1000.0000 mg | ORAL_TABLET | Freq: Two times a day (BID) | ORAL | Status: DC

## 2014-07-09 NOTE — Progress Notes (Signed)
Subjective:     Patient ID: Denise Beard, female   DOB: Nov 02, 1960, 54 y.o.   MRN: [id number]  Diabetes   Denise Beard is a pleasant 54 y.o. woman, returning for f/u for DM 2, dx 2012, insulin-dependent, uncontrolled, without complications. Last visit 4 mo ago.  Her last hemoglobin A1c: Lab Results  Component Value Date   HGBA1C 8.5* 07/08/2014   HGBA1C 6.7* 02/26/2014   HGBA1C 7.0* 10/30/2013  Previously 7.9% (02/18/2012).   She is on:  - Januvia 100 in am - started in 09/2012 - metformin 1000 mg bid - glipizide XL 5 mg a day She was on saxagliptin/metformin XR 09/998 mg (Kombyglyze) in the past. She started on on JanuMet 50/1000 bid on 07/17/2013 >> lightheaded, HA, blurry vision.  She checks 2-3x a week - brings meter - sugars MUCH WORSE: - am: 133-179 >>170-190 (lowest 138 >> felt dizzy) >> 150-168 >> 133-194 >> 115-154 >> 114-150 >> 204-299 - 2h after b'fast: 189-288 >> n/c >> 180, 263 (checks only when feels bad) >> 206-375 - before lunch: one value: 179 >> 150 >> n/c >> 177, 191 >> 153 >> n/c - 2h after lunch: 120, 136, 169, 211 >> n/c >> 135 >> 248 - before dinner: 160 >> n/c >> 190 >> n/c >> 130, 153, 169 >> 113 >> 244 - bedtime: 109-164 >> not checking >> 130-140 >> 152, 164, 233 >> 174 >> 217 Strips are expensive. Meter: One Touch Ultra 2 Highest sugar 262. Lowest 114.  She had nutrition classes with Db education (nutrition) in the past.   - Last eye exam 09/07/2013. No DR. (Dr Macarthur Critchley [facility]) - no CKD; no neuropathy. ACR was 1.9 on 10/06/2012. Lab Results  Component Value Date   BUN 9 07/08/2014   CREATININE 0.71 07/08/2014   - Lipids:  Lab Results  Component Value Date   CHOL 158 07/08/2014   HDL 37.40* 07/08/2014   LDLCALC 97 07/08/2014   LDLDIRECT 179.6 07/17/2013   TRIG 120.0 07/08/2014   CHOLHDL 4 07/08/2014  She was on Zocor, now stopped.  She also has a history of anxiety; asthma; GERD; constipation; polyarthropathy; seizure  disorder.   Review of Systems Constitutional: + weight gain, no fatigue, no subjective hyperthermia/hypothermia Eyes: no blurry vision, no xerophthalmia ENT: no sore throat, no nodules palpated in throat, no dysphagia/odynophagia, no hoarseness Cardiovascular: no CP/SOB/palpitations/leg swelling Respiratory: no cough/SOB/wheezing Gastrointestinal: no N/V/D/C Musculoskeletal: no muscle/joint aches Skin: no rashes Neurological: no tremors/numbness/tingling/dizziness  I reviewed pt's medications, allergies, PMH, social hx, family hx, and changes were documented in the history of present illness. Otherwise, unchanged from my initial visit note.  Objective:   Physical Exam  BP 108/76 mmHg  Pulse 75  Temp(Src) 98.3 F (36.8 C)  Resp 14  Wt 171 lb 9.6 oz (77.837 kg)  SpO2 96% Body mass index is 28.11 kg/(m^2).  Wt Readings from Last 3 Encounters:  07/09/14 171 lb 9.6 oz (77.837 kg)  07/02/14 172 lb 9.6 oz (78.291 kg)  03/05/14 165 lb (74.844 kg)  Constitutional: slightly overweight, in NAD Eyes: PERRLA, EOMI, no exophthalmos ENT: moist mucous membranes, no thyromegaly, no cervical lymphadenopathy Cardiovascular: RRR, No MRG Respiratory: CTA B Gastrointestinal: abdomen soft, NT, ND, BS+ Skin: moist, warm Neuro: DTR normal in all 4  Assessment:     1. DM2, insulin-dependent, uncontrolled, without complications    Plan:     Pt with worsened sugars over the Holidays. Her CBGs are in the mid-high  200s throughout the day. We discussed to start basal insulin >> will start 18 units Lantus at bedtime.  I demonstrated pen use.  Patient Instructions  Please continue: - Januvia 100 mg in am - metformin 1000 mg 2x a day - glipizide XL 5 mg in am  Please start Lantus 18 units at bedtime.  When injecting insulin:  Inject in the abdomen  Rotate the injection sites around the belly button  Change needle for each injection  Keep needle in for 10 sec after last unit of insulin  in  Keep the insulin in use out of the fridge  Please return in 1 month with your sugar log. Check some sugars at bedtime, too.  - we discussed that we may be able to decrease and even stop insulin in the future  - given discount card for Januvia - Invokana is an option at next visit if we need to intensify tx but cost is an issue.  - UTD with eye exams - had the flu shot this season - RTC in 1 mo

## 2014-07-09 NOTE — Patient Instructions (Signed)
Please continue: - Januvia 100 mg in am - metformin 1000 mg 2x a day - glipizide XL 5 mg in am  Please start Lantus 18 units at bedtime.  When injecting insulin:  Inject in the abdomen  Rotate the injection sites around the belly button  Change needle for each injection  Keep needle in for 10 sec after last unit of insulin in  Keep the insulin in use out of the fridge  Please return in 1 month with your sugar log. Check some sugars at bedtime, too.

## 2014-07-14 ENCOUNTER — Other Ambulatory Visit: Payer: Self-pay | Admitting: Family

## 2014-07-23 ENCOUNTER — Telehealth: Payer: Self-pay | Admitting: Internal Medicine

## 2014-07-23 NOTE — Telephone Encounter (Signed)
Patient stated that with the medication Lantus 18 mg she is experiences a lot lows, 80 and 90, could Dr Cruzita Lederer could adjust med, please advise

## 2014-07-23 NOTE — Telephone Encounter (Signed)
Called pt and advised her per Dr Bluford Kaufmann note. Pt undestood.

## 2014-07-23 NOTE — Telephone Encounter (Signed)
Called pt and asked what time she is having lows. Pt is having her lows in the middle of the day, right before lunch and again in afternoon around 3 pm. This have just started this week. Pt just started the Lantus on Monday. Pt is taking the Lantus around 8 pm because she is going to bed shortly after. Please advise.

## 2014-07-23 NOTE — Telephone Encounter (Signed)
That is actually great news >> reduce the Lantus to 15 units and let us know how she is doing in ~3 days.

## 2014-07-30 ENCOUNTER — Telehealth: Payer: Self-pay | Admitting: Internal Medicine

## 2014-07-30 NOTE — Telephone Encounter (Signed)
Pt called saying she had another episode of sugar going low yesterday and she is at home sick today due to feeling dizzy and lite  headed from low sugar

## 2014-07-30 NOTE — Telephone Encounter (Signed)
Please read message below and advise in Dr Gherghe's absence.  

## 2014-07-30 NOTE — Telephone Encounter (Signed)
Please read messge below and advise.

## 2014-07-30 NOTE — Telephone Encounter (Signed)
Called pt and advised her per Dr Ronnie Derby note in Dr Arman Filter absence. Please review notes below Dr Cruzita Lederer and advise if pt needs to continue with the glipizide.

## 2014-07-30 NOTE — Telephone Encounter (Signed)
This afternoon Mrs. Grieger called and would like to know if anything had been resolved  Per Dr. Dwyane Dee request Mrs. Pablo provided me with her most recent blood sugars   3.8.16  In am 121 before breakfast  12:47 pm 96 before lunch 3:58 pm 102 after lunch   3.9.16 In am 126 before breakfast  1:03 pm 103 before lunch  3:08 pm 106 after lunch   3.10.16 In am 130 before breakfast  1:41 pm 46 (when patient symptoms started) 1:54 pm 114  3.11.16 In am 159 before breakfast   Please advise patient   Thank you

## 2014-07-30 NOTE — Telephone Encounter (Signed)
Need to know exactly what her blood sugar readings are for the last 3 days including today

## 2014-07-30 NOTE — Telephone Encounter (Signed)
She should stop glipizide ER until Monday

## 2014-08-10 ENCOUNTER — Telehealth: Payer: Self-pay | Admitting: Internal Medicine

## 2014-08-10 NOTE — Telephone Encounter (Signed)
Patient stopped glipizide 3.11.16 per Dr. Dwyane Dee orders and should she restart the glipizide?  Please advise

## 2014-08-10 NOTE — Telephone Encounter (Signed)
Called pt and lvm advising her to return call and advise [place] of her sugar levels.

## 2014-08-10 NOTE — Telephone Encounter (Signed)
It's impossible to decide without sugars. Let's find out how her sugars were since then.

## 2014-08-10 NOTE — Telephone Encounter (Signed)
Please read message below (and previous phone messages) and advise. Thank you.

## 2014-08-13 ENCOUNTER — Ambulatory Visit: Payer: Commercial Managed Care - PPO | Admitting: Internal Medicine

## 2014-08-20 LAB — HM MAMMOGRAPHY

## 2014-09-03 ENCOUNTER — Encounter: Payer: Self-pay | Admitting: Internal Medicine

## 2014-09-03 ENCOUNTER — Ambulatory Visit (INDEPENDENT_AMBULATORY_CARE_PROVIDER_SITE_OTHER): Payer: Commercial Managed Care - PPO | Admitting: Internal Medicine

## 2014-09-03 VITALS — BP 114/70 | HR 71 | Temp 97.8°F | Resp 12 | Wt 167.2 lb

## 2014-09-03 DIAGNOSIS — E1165 Type 2 diabetes mellitus with hyperglycemia: Secondary | ICD-10-CM

## 2014-09-03 DIAGNOSIS — IMO0002 Reserved for concepts with insufficient information to code with codable children: Secondary | ICD-10-CM

## 2014-09-03 NOTE — Progress Notes (Signed)
Subjective:     Patient ID: Denise Beard, female   DOB: August 31, 1960, 54 y.o.   MRN: [id number]  Diabetes   [name] is a pleasant 54 y.o. woman, returning for f/u for DM 2, dx 2012, insulin-dependent, uncontrolled, without complications. Last visit 2 mo ago.  Her last hemoglobin A1c: Lab Results  Component Value Date   HGBA1C 8.5* 07/08/2014   HGBA1C 6.7* 02/26/2014   HGBA1C 7.0* 10/30/2013  Previously 7.9% (02/18/2012).   She is on:  - Januvia 100 in am - started in 09/2012 - metformin 1000 mg bid - glipizide XL 5 mg a day (stopped and then restarted - 2 weeks ago - was off for 2 weeks) - Lantus 18 units at bedtime >> 15 units She was on saxagliptin/metformin XR 09/998 mg (Kombyglyze) in the past. She started on on JanuMet 50/1000 bid on 07/17/2013 >> lightheaded, HA, blurry vision.  She checks 2-3x a week - brings meter, which we downloaded: - am: 150-168 >> 133-194 >> 115-154 >> 114-150 >> 204-299 >> 109-165, 214 (soft drink at night) - 2h after b'fast: 189-288 >> n/c >> 180, 263 (checks only when feels bad) >> 206-375 >> n/c - before lunch: one value: 179 >> 150 >> n/c >> 177, 191 >> 153 >> n/c >> 161 - 2h after lunch: 120, 136, 169, 211 >> n/c >> 135 >> 248 >> 169, 257 - before dinner: 160 >> n/c >> 190 >> n/c >> 130, 153, 169 >> 113 >> 244 >> n/c - 2h after dinner: 124, 190-274 - bedtime: 109-164 >> not checking >> 130-140 >> 152, 164, 233 >> 174 >> 217 >> n/c Strips are expensive. Meter: One Touch Ultra 2  She had nutrition classes with Db education (nutrition) in the past.   - Last eye exam 09/07/2013. No DR. (Dr Macarthur Critchley [facility]) - no CKD; no neuropathy. ACR was 1.9 on 10/06/2012. Lab Results  Component Value Date   BUN 9 07/08/2014   CREATININE 0.71 07/08/2014   - Lipids:  Lab Results  Component Value Date   CHOL 158 07/08/2014   HDL 37.40* 07/08/2014   LDLCALC 97 07/08/2014   LDLDIRECT 179.6 07/17/2013   TRIG 120.0 07/08/2014   CHOLHDL 4 07/08/2014  She was on Zocor, now stopped.  She also has a history of anxiety; asthma; GERD; constipation; polyarthropathy; seizure disorder.   Review of Systems Constitutional: no weight gain/loss, no fatigue, no subjective hyperthermia/hypothermia Eyes: + blurry vision, no xerophthalmia ENT: no sore throat, no nodules palpated in throat, no dysphagia/odynophagia, no hoarseness Cardiovascular: no CP/SOB/palpitations/leg swelling Respiratory: no cough/SOB/wheezing Gastrointestinal: no N/V/D/C Musculoskeletal: no muscle/joint aches Skin: no rashes, + hair loss Neurological: no tremors/numbness/tingling/dizziness  I reviewed pt's medications, allergies, PMH, social hx, family hx, and changes were documented in the history of present illness. Otherwise, unchanged from my initial visit note.  Objective:   Physical Exam  BP 114/70 mmHg  Pulse 71  Temp(Src) 97.8 F (36.6 C) (Oral)  Resp 12  Wt 167 lb 3.2 oz (75.841 kg)  SpO2 97% Body mass index is 27.39 kg/(m^2).  Wt Readings from Last 3 Encounters:  09/03/14 167 lb 3.2 oz (75.841 kg)  07/09/14 171 lb 9.6 oz (77.837 kg)  07/02/14 172 lb 9.6 oz (78.291 kg)  Constitutional: slightly overweight, in NAD Eyes: PERRLA, EOMI, no exophthalmos ENT: moist mucous membranes, no thyromegaly, no cervical lymphadenopathy Cardiovascular: RRR, No MRG Respiratory: CTA B Gastrointestinal: abdomen soft, NT, ND, BS+ Skin: moist, warm Neuro:  DTR normal in all 4  Assessment:     1. DM2, insulin-dependent, uncontrolled, without complications    Plan:     Pt with worsened sugars over the Holidays. At last visit, since her sugars in the morning were between 200-300s, we started Lantus. She had a period of low blood sugars after this, during which she briefly stopped the glipizide. Her sugars started to come up afterwards and they were in the hypoglycemic range, so she needed to restart glipizide. She is now on both Lantus and glipizide along  with Januvia and metformin, with still poor control. This is in contrast with her sugars around October of last year, when her hemoglobin A1c returned lower than 7. She believes that the change resulted from her relaxing her diet and eating a lot of fast food. She is determined to change this. She refuses a referral to nutrition for now. We'll continue the current regimen, however she will need to change her diet. Invokana is an option at next visit if we need to intensify tx. She has [place]. Patient Instructions  Please continue: - Januvia 100 mg in am - metformin 1000 mg 2x a day - glipizide XL 5 mg in am - Lantus 15 units at bedtime.  Please return in 1.5 months with your sugar log.   Please work on your diet until then. - UTD with eye exams, but needs another exam soon - RTC in 1.5 mo. Will check a hemoglobin A1c done.  - time spent with the patient: 25 min, of which >50% was spent in reviewing her glucometer downloads, discussing her hypo- and hyper-glycemic episodes, reviewing previous labs and diabetic regimen and discussing modalities to avoid hypo- and hyper-glycemia. We also spent time discussing specific ways to improve her diet.

## 2014-09-03 NOTE — Patient Instructions (Addendum)
Patient Instructions  Please continue: - Januvia 100 mg in am - metformin 1000 mg 2x a day - glipizide XL 5 mg in am - Lantus 15 units at bedtime.  Please return in 1.5 months with your sugar log.   Please work on your diet until then.

## 2014-10-01 ENCOUNTER — Telehealth: Payer: Self-pay

## 2014-10-01 NOTE — Telephone Encounter (Signed)
Left message for pt to call back concerning need for mammogram screening.

## 2014-10-15 ENCOUNTER — Encounter: Payer: Self-pay | Admitting: Internal Medicine

## 2014-10-15 ENCOUNTER — Ambulatory Visit (INDEPENDENT_AMBULATORY_CARE_PROVIDER_SITE_OTHER): Payer: Commercial Managed Care - PPO | Admitting: Internal Medicine

## 2014-10-15 ENCOUNTER — Other Ambulatory Visit: Payer: Self-pay | Admitting: *Deleted

## 2014-10-15 ENCOUNTER — Other Ambulatory Visit: Payer: Self-pay | Admitting: Family

## 2014-10-15 VITALS — BP 104/68 | HR 82 | Temp 97.8°F | Resp 12 | Wt 171.0 lb

## 2014-10-15 DIAGNOSIS — E1165 Type 2 diabetes mellitus with hyperglycemia: Secondary | ICD-10-CM | POA: Diagnosis not present

## 2014-10-15 DIAGNOSIS — IMO0002 Reserved for concepts with insufficient information to code with codable children: Secondary | ICD-10-CM

## 2014-10-15 MED ORDER — GLIPIZIDE ER 5 MG PO TB24: ORAL_TABLET | ORAL | Status: DC

## 2014-10-15 NOTE — Telephone Encounter (Signed)
Pt last seen 2.12.2015. Rx last filled on 12.1.2015.  Pls advise.

## 2014-10-15 NOTE — Progress Notes (Signed)
Subjective:     Patient ID: Denise Beard, female   DOB: Jan 17, 1961, 54 y.o.   MRN: [id number]  Diabetes   Ms. Hakim is a pleasant 54 y.o. woman, returning for f/u for DM 2, dx 2012, insulin-dependent, uncontrolled, without complications. Last visit 1.5 mo ago.  Her last hemoglobin A1c: Lab Results  Component Value Date   HGBA1C 8.5* 07/08/2014   HGBA1C 6.7* 02/26/2014   HGBA1C 7.0* 10/30/2013  Previously 7.9% (02/18/2012).   She is on:  - Januvia 100 mg in am - metformin 1000 mg 2x a day - glipizide XL 5 mg in am - Lantus 15 units at bedtime. She was on saxagliptin/metformin XR 09/998 mg (Kombyglyze) in the past. She started on on JanuMet 50/1000 bid on 07/17/2013 >> lightheaded, HA, blurry vision.  She checks 2-3x a week - brings log: - am: 150-168 >> 133-194 >> 115-154 >> 114-150 >> 204-299 >> 109-165, 214 >> 144-174, 207, 236 - 2h after b'fast: 189-288 >> n/c >> 180, 263 (checks only when feels bad) >> 206-375 >> n/c  - before lunch: one value: 179 >> 150 >> n/c >> 177, 191 >> 153 >> n/c >> 161 >> 92-143 - 2h after lunch: 120, 136, 169, 211 >> n/c >> 135 >> 248 >> 169, 257 >> 134-173-241 - before dinner: 160 >> n/c >> 190 >> n/c >> 130, 153, 169 >> 113 >> 244 >> n/c - 2h after dinner: 124, 190-274 >> 160-220, 319 - bedtime: 109-164 >> not checking >> 130-140 >> 152, 164, 233 >> 174 >> 217 >> n/c>> 129-242 Strips are expensive. Meter: One Touch Ultra 2  She had nutrition classes with Db education (nutrition) in the past.   - Last eye exam 09/07/2013. No DR. (Dr Macarthur Critchley [facility]) - no CKD; no neuropathy. ACR was 1.9 on 10/06/2012. Lab Results  Component Value Date   BUN 9 07/08/2014   CREATININE 0.71 07/08/2014   - Lipids:  Lab Results  Component Value Date   CHOL 158 07/08/2014   HDL 37.40* 07/08/2014   LDLCALC 97 07/08/2014   LDLDIRECT 179.6 07/17/2013   TRIG 120.0 07/08/2014   CHOLHDL 4 07/08/2014  She was on Zocor, now stopped.  She also  has a history of anxiety; asthma; GERD; constipation; polyarthropathy; seizure disorder.   Review of Systems Constitutional: no weight gain/loss, no fatigue, no subjective hyperthermia/hypothermia Eyes: + blurry vision, no xerophthalmia ENT: no sore throat, no nodules palpated in throat, no dysphagia/odynophagia, no hoarseness Cardiovascular: no CP/SOB/palpitations/leg swelling Respiratory: no cough/SOB/wheezing Gastrointestinal: no N/V/D/C Musculoskeletal: no muscle/joint aches Skin: no rashes, + hair loss Neurological: no tremors/numbness/tingling/dizziness  I reviewed pt's medications, allergies, PMH, social hx, family hx, and changes were documented in the history of present illness. Otherwise, unchanged from my initial visit note.  Objective:   Physical Exam  BP 104/68 mmHg  Pulse 82  Temp(Src) 97.8 F (36.6 C) (Oral)  Resp 12  Wt 171 lb (77.565 kg)  SpO2 95% Body mass index is 28.01 kg/(m^2).  Wt Readings from Last 3 Encounters:  10/15/14 171 lb (77.565 kg)  09/03/14 167 lb 3.2 oz (75.841 kg)  07/09/14 171 lb 9.6 oz (77.837 kg)  Constitutional: slightly overweight, in NAD Eyes: PERRLA, EOMI, no exophthalmos ENT: moist mucous membranes, no thyromegaly, no cervical lymphadenopathy Cardiovascular: RRR, No MRG Respiratory: CTA B Gastrointestinal: abdomen soft, NT, ND, BS+ Skin: moist, warm Neuro: DTR normal in all 4  Assessment:     1. DM2, insulin-dependent, uncontrolled,  without complications    Plan:     Pt with better sugars c/w last visit. We'll continue the current regimen, but I advised her to increase her glipizide extended-release to 10 mg daily. She also needs to continue to improve her diet. Invokana is an option at next visit if we need to intensify tx. She has [place]. Patient Instructions  Please continue: - Januvia 100 mg in am - metformin 1000 mg 2x a day - Lantus 15 units at bedtime  Please try to increase the Glipizide XL to 10 mg daily in am. Check  sugars frequently after the increase.  Please stop at the lab.  Please come back for a follow-up appointment in 3 months.  - She needs another eye exam - Will check a hemoglobin A1c. - Return in about 3 months (around 01/15/2015).  Office Visit on 10/15/2014  Component Date Value Ref Range Status  . Hgb A1c MFr Bld 10/15/2014 7.6* <5.7 % Final   Comment:                                                                        According to the ADA Clinical Practice Recommendations for 2011, when HbA1c is used as a screening test:     >=6.5%   Diagnostic of Diabetes Mellitus            (if abnormal result is confirmed)   5.7-6.4%   Increased risk of developing Diabetes Mellitus   References:Diagnosis and Classification of Diabetes Mellitus,Diabetes DUKG,2542,70(WCBJS 1):S62-S69 and Standards of Medical Care in         Diabetes - 2011,Diabetes EGBT,5176,16 (Suppl 1):S11-S61.     . Mean Plasma Glucose 10/15/2014 171* <117 mg/dL Final   hemoglobin A1c has improved.

## 2014-10-15 NOTE — Patient Instructions (Signed)
Please continue: - Januvia 100 mg in am - metformin 1000 mg 2x a day - Lantus 15 units at bedtime  Please try to increase the Glipizide XL to 10 mg daily in am. Check sugars frequently after the increase.  Please stop at the lab.  Please come back for a follow-up appointment in 3 months

## 2014-10-15 NOTE — Telephone Encounter (Signed)
Needs office follow up  Is she establishing with new provider?  May refill once only until follow up.

## 2014-10-16 LAB — HEMOGLOBIN A1C
HEMOGLOBIN A1C: 7.6 % — AB (ref <5.7)
Mean Plasma Glucose: 171 mg/dL — ABNORMAL HIGH (ref <117)

## 2014-10-19 ENCOUNTER — Other Ambulatory Visit: Payer: Self-pay | Admitting: Family

## 2014-10-23 ENCOUNTER — Other Ambulatory Visit: Payer: Self-pay | Admitting: Family

## 2014-11-04 ENCOUNTER — Other Ambulatory Visit (INDEPENDENT_AMBULATORY_CARE_PROVIDER_SITE_OTHER): Payer: Commercial Managed Care - PPO

## 2014-11-04 DIAGNOSIS — Z0000 Encounter for general adult medical examination without abnormal findings: Secondary | ICD-10-CM

## 2014-11-04 LAB — HEPATIC FUNCTION PANEL
ALBUMIN: 3.9 g/dL (ref 3.5–5.2)
ALK PHOS: 73 U/L (ref 39–117)
ALT: 17 U/L (ref 0–35)
AST: 15 U/L (ref 0–37)
Bilirubin, Direct: 0.1 mg/dL (ref 0.0–0.3)
Total Bilirubin: 0.3 mg/dL (ref 0.2–1.2)
Total Protein: 7 g/dL (ref 6.0–8.3)

## 2014-11-04 LAB — LIPID PANEL
CHOLESTEROL: 139 mg/dL (ref 0–200)
HDL: 38.4 mg/dL — ABNORMAL LOW (ref >39.00)
LDL Cholesterol: 83 mg/dL (ref 0–99)
NonHDL: 100.6
Total CHOL/HDL Ratio: 4
Triglycerides: 90 mg/dL (ref 0.0–149.0)
VLDL: 18 mg/dL (ref 0.0–40.0)

## 2014-11-04 LAB — CBC WITH DIFFERENTIAL/PLATELET
BASOS ABS: 0 10*3/uL (ref 0.0–0.1)
Basophils Relative: 0.4 % (ref 0.0–3.0)
EOS PCT: 2.9 % (ref 0.0–5.0)
Eosinophils Absolute: 0.2 10*3/uL (ref 0.0–0.7)
HEMATOCRIT: 36 % (ref 36.0–46.0)
HEMOGLOBIN: 11.8 g/dL — AB (ref 12.0–15.0)
Lymphocytes Relative: 43 % (ref 12.0–46.0)
Lymphs Abs: 3.4 10*3/uL (ref 0.7–4.0)
MCHC: 32.7 g/dL (ref 30.0–36.0)
MCV: 91.3 fl (ref 78.0–100.0)
MONOS PCT: 8.6 % (ref 3.0–12.0)
Monocytes Absolute: 0.7 10*3/uL (ref 0.1–1.0)
Neutro Abs: 3.6 10*3/uL (ref 1.4–7.7)
Neutrophils Relative %: 45.1 % (ref 43.0–77.0)
Platelets: 266 10*3/uL (ref 150.0–400.0)
RBC: 3.95 Mil/uL (ref 3.87–5.11)
RDW: 13 % (ref 11.5–15.5)
WBC: 7.9 10*3/uL (ref 4.0–10.5)

## 2014-11-04 LAB — POCT URINALYSIS DIPSTICK
Bilirubin, UA: NEGATIVE
Blood, UA: NEGATIVE
Glucose, UA: NEGATIVE
Ketones, UA: NEGATIVE
LEUKOCYTES UA: NEGATIVE
Nitrite, UA: NEGATIVE
PROTEIN UA: NEGATIVE
Spec Grav, UA: 1.015
Urobilinogen, UA: 0.2
pH, UA: 5

## 2014-11-04 LAB — BASIC METABOLIC PANEL
BUN: 8 mg/dL (ref 6–23)
CHLORIDE: 104 meq/L (ref 96–112)
CO2: 27 mEq/L (ref 19–32)
CREATININE: 0.68 mg/dL (ref 0.40–1.20)
Calcium: 9.4 mg/dL (ref 8.4–10.5)
GFR: 116.66 mL/min (ref >60.00)
Glucose, Bld: 153 mg/dL — ABNORMAL HIGH (ref 70–99)
POTASSIUM: 4.6 meq/L (ref 3.5–5.1)
Sodium: 137 mEq/L (ref 135–145)

## 2014-11-04 LAB — TSH: TSH: 1.74 u[IU]/mL (ref 0.35–4.50)

## 2014-11-10 ENCOUNTER — Ambulatory Visit (INDEPENDENT_AMBULATORY_CARE_PROVIDER_SITE_OTHER): Payer: Commercial Managed Care - PPO | Admitting: Family

## 2014-11-10 ENCOUNTER — Encounter: Payer: Self-pay | Admitting: Family

## 2014-11-10 VITALS — BP 100/60 | HR 81 | Temp 98.2°F | Ht 65.0 in | Wt 172.8 lb

## 2014-11-10 DIAGNOSIS — Z23 Encounter for immunization: Secondary | ICD-10-CM | POA: Diagnosis not present

## 2014-11-10 DIAGNOSIS — E119 Type 2 diabetes mellitus without complications: Secondary | ICD-10-CM

## 2014-11-10 DIAGNOSIS — Z0000 Encounter for general adult medical examination without abnormal findings: Secondary | ICD-10-CM

## 2014-11-10 DIAGNOSIS — E785 Hyperlipidemia, unspecified: Secondary | ICD-10-CM

## 2014-11-10 MED ORDER — SIMVASTATIN 20 MG PO TABS: ORAL_TABLET | ORAL | Status: DC

## 2014-11-10 MED ORDER — ZOLPIDEM TARTRATE 10 MG PO TABS: 10.0000 mg | ORAL_TABLET | Freq: Every evening | ORAL | Status: DC | PRN

## 2014-11-10 NOTE — Progress Notes (Signed)
Subjective:    Patient ID: Denise Beard, female    DOB: December 28, 1960, 54 y.o.   MRN: [id number]  HPI 54 year old African-American female, nonsmoker with a history of type 2 diabetes, hyperlipidemia, asthma is in today for complete physical exam. Denies any concerns today. Is under the care of endocrinology for management of type 2 diabetes. Has a appointment scheduled for September to establish with Dr. Yong Channel. Has not had a colonoscopy. Mammogram done in April.   Review of Systems  Constitutional: Negative.   HENT: Negative.   Eyes: Negative.   Respiratory: Negative.   Cardiovascular: Negative.   Gastrointestinal: Negative.   Endocrine: Negative.   Genitourinary: Negative.   Musculoskeletal: Negative.   Skin: Negative.   Allergic/Immunologic: Negative.   Neurological: Negative.   Hematological: Negative.   Psychiatric/Behavioral: Negative.    Past Medical History  Diagnosis Date  . DIAB W/O COMP TYPE II/UNS NOT STATED UNCNTRL 04/06/2009  . ANXIETY 03/17/2007  . ASTHMA 06/26/2007  . GERD 06/26/2007  . CONSTIPATION, CHRONIC 02/27/2010  . Irritable bowel syndrome 02/27/2010  . UNSPEC POLYARTHROPATHY/POLYARTHRIT MX SITES 12/13/2009  . SEIZURE DISORDER 04/17/2010    History   Social History  . Marital Status: Divorced    Spouse Name: N/A  . Number of Children: N/A  . Years of Education: N/A   Occupational History  . Not on file.   Social History Main Topics  . Smoking status: Never Smoker   . Smokeless tobacco: Never Used  . Alcohol Use: No  . Drug Use: No  . Sexual Activity: Not on file   Other Topics Concern  . Not on file   Social History Narrative   Single   2 chldren   Works; Nurse, mental health for background checks     Past Surgical History  Procedure Laterality Date  . Cesarean section    . Tubal ligation    . Abdominal hysterectomy      Family History  Problem Relation Age of Onset  . Thyroid disease Mother   . Cancer Mother   .  Hyperlipidemia Mother   . Cancer Maternal Grandfather     Allergies  Allergen Reactions  . Iodine     Current Outpatient Prescriptions on File Prior to Visit  Medication Sig Dispense Refill  . budesonide-formoterol (SYMBICORT) 80-4.5 MCG/ACT inhaler Inhale 2 puffs into the lungs 2 (two) times daily.      Marland Kitchen glipiZIDE (GLUCOTROL XL) 5 MG 24 hr tablet TAKE 1 TABLET (5 MG TOTAL) BY MOUTH 2x daily 60 tablet 5  . Insulin Glargine (LANTUS SOLOSTAR) 100 UNIT/ML Solostar Pen Inject 18 Units into the skin daily at 10 pm. 5 pen 2  . Insulin Pen Needle (CAREFINE PEN NEEDLES) 32G X 4 MM MISC Use 1x a day 100 each 2  . ipratropium (ATROVENT) 0.06 % nasal spray Place 2 sprays into the nose 3 (three) times daily. 15 mL 0  . JANUVIA 100 MG tablet TAKE 1 TABLET (100 MG TOTAL) BY MOUTH DAILY. 30 tablet 5  . metFORMIN (GLUCOPHAGE) 1000 MG tablet Take 1 tablet (1,000 mg total) by mouth 2 (two) times daily with a meal. 60 tablet 2  . ONE TOUCH ULTRA TEST test strip USE AS DIRECTED 100 each 6  . simvastatin (ZOCOR) 20 MG tablet TAKE 1 TABLET (20 MG TOTAL) BY MOUTH AT BEDTIME. 30 tablet 3  . zolpidem (AMBIEN) 10 MG tablet TAKE 1 TABLET AT BEDTIME AS NEEDED FOR SLEEP 30 tablet 0   No current facility-administered  medications on file prior to visit.    BP 100/60 mmHg  Pulse 81  Temp(Src) 98.2 F (36.8 C) (Oral)  Ht 5\' 5"  (1.651 m)  Wt 172 lb 12.8 oz (78.382 kg)  BMI 28.76 kg/m2chart    Objective:   Physical Exam  Constitutional: She is oriented to person, place, and time. She appears well-developed and well-nourished.  HENT:  Head: Normocephalic.  Right Ear: External ear normal.  Left Ear: External ear normal.  Nose: Nose normal.  Mouth/Throat: Oropharynx is clear and moist.  Eyes: Conjunctivae are normal. Pupils are equal, round, and reactive to light.  Neck: Normal range of motion. Neck supple. No thyromegaly present.  Cardiovascular: Normal rate, regular rhythm and normal heart sounds.     Pulmonary/Chest: Effort normal and breath sounds normal.  Abdominal: Soft. Bowel sounds are normal.  Musculoskeletal: Normal range of motion.  Neurological: She is alert and oriented to person, place, and time. She has normal reflexes.  Skin: Skin is warm and dry.  Psychiatric: She has a normal mood and affect.          Assessment & Plan:  [name] was seen today for annual exam.  Diagnoses and all orders for this visit:  Preventative health care Orders: -     Ambulatory referral to Gastroenterology  Type 2 diabetes mellitus without complication  Hyperlipidemia  Need for prophylactic vaccination against Streptococcus pneumoniae (pneumococcus) Orders: -     Pneumococcal conjugate vaccine 13-valent   call the office with any questions or concerns. Recheck in 6 months and sooner as needed.

## 2014-11-10 NOTE — Patient Instructions (Signed)
Diabetes and Standards of Medical Care Diabetes is complicated. You may find that your diabetes team includes a dietitian, nurse, diabetes educator, eye doctor, and more. To help everyone know what is going on and to help you get the care you deserve, the following schedule of care was developed to help keep you on track. Below are the tests, exams, vaccines, medicines, education, and plans you will need. HbA1c test This test shows how well you have controlled your glucose over the past 2-3 months. It is used to see if your diabetes management plan needs to be adjusted.   It is performed at least 2 times a year if you are meeting treatment goals.  It is performed 4 times a year if therapy has changed or if you are not meeting treatment goals. Blood pressure test  This test is performed at every routine medical visit. The goal is less than 140/90 mm Hg for most people, but 130/80 mm Hg in some cases. Ask your health care provider about your goal. Dental exam  Follow up with the dentist regularly. Eye exam  If you are diagnosed with type 1 diabetes as a child, get an exam upon reaching the age of 52 years or older and have had diabetes for 3-5 years. Yearly eye exams are recommended after that initial eye exam.  If you are diagnosed with type 1 diabetes as an adult, get an exam within 5 years of diagnosis and then yearly.  If you are diagnosed with type 2 diabetes, get an exam as soon as possible after the diagnosis and then yearly. Foot care exam  Visual foot exams are performed at every routine medical visit. The exams check for cuts, injuries, or other problems with the feet.  A comprehensive foot exam should be done yearly. This includes visual inspection as well as assessing foot pulses and testing for loss of sensation.  Check your feet nightly for cuts, injuries, or other problems with your feet. Tell your health care provider if anything is not healing. Kidney function test (urine  microalbumin)  This test is performed once a year.  Type 1 diabetes: The first test is performed 5 years after diagnosis.  Type 2 diabetes: The first test is performed at the time of diagnosis.  A serum creatinine and estimated glomerular filtration rate (eGFR) test is done once a year to assess the level of chronic kidney disease (CKD), if present. Lipid profile (cholesterol, HDL, LDL, triglycerides)  Performed every 5 years for most people.  The goal for LDL is less than 100 mg/dL. If you are at high risk, the goal is less than 70 mg/dL.  The goal for HDL is 40 mg/dL-50 mg/dL for men and 50 mg/dL-60 mg/dL for women. An HDL cholesterol of 60 mg/dL or higher gives some protection against heart disease.  The goal for triglycerides is less than 150 mg/dL. Influenza vaccine, pneumococcal vaccine, and hepatitis B vaccine  The influenza vaccine is recommended yearly.  It is recommended that people with diabetes who are over 52 years old get the pneumonia vaccine. In some cases, two separate shots may be given. Ask your health care provider if your pneumonia vaccination is up to date.  The hepatitis B vaccine is also recommended for adults with diabetes. Diabetes self-management education  Education is recommended at diagnosis and ongoing as needed. Treatment plan  Your treatment plan is reviewed at every medical visit. Document Released: 03/04/2009 Document Revised: 09/21/2013 Document Reviewed: 10/07/2012 [facility] Patient Information 2015 [place],  [employer]. This information is not intended to replace advice given to you by your health care provider. Make sure you discuss any questions you have with your health care provider.  

## 2014-11-10 NOTE — Progress Notes (Signed)
Pre visit review using our clinic review tool, if applicable. No additional management support is needed unless otherwise documented below in the visit note. 

## 2014-11-11 ENCOUNTER — Other Ambulatory Visit: Payer: Self-pay | Admitting: Internal Medicine

## 2014-11-26 ENCOUNTER — Encounter: Payer: Self-pay | Admitting: Family

## 2014-11-26 ENCOUNTER — Ambulatory Visit (INDEPENDENT_AMBULATORY_CARE_PROVIDER_SITE_OTHER): Payer: Commercial Managed Care - PPO | Admitting: Family

## 2014-11-26 VITALS — BP 140/98 | HR 74 | Temp 98.0°F | Wt 177.0 lb

## 2014-11-26 DIAGNOSIS — E119 Type 2 diabetes mellitus without complications: Secondary | ICD-10-CM

## 2014-11-26 DIAGNOSIS — E7800 Pure hypercholesterolemia, unspecified: Secondary | ICD-10-CM

## 2014-11-26 DIAGNOSIS — R2232 Localized swelling, mass and lump, left upper limb: Secondary | ICD-10-CM

## 2014-11-26 DIAGNOSIS — D179 Benign lipomatous neoplasm, unspecified: Secondary | ICD-10-CM

## 2014-11-26 DIAGNOSIS — J309 Allergic rhinitis, unspecified: Secondary | ICD-10-CM

## 2014-11-26 MED ORDER — FLUTICASONE PROPIONATE 50 MCG/ACT NA SUSP: 2.0000 | Freq: Every day | NASAL | Status: DC

## 2014-11-26 NOTE — Progress Notes (Signed)
Pre visit review using our clinic review tool, if applicable. No additional management support is needed unless otherwise documented below in the visit note. Lab Results  Component Value Date   HGBA1C 7.6* 10/15/2014   HGBA1C 8.5* 07/08/2014   HGBA1C 6.7* 02/26/2014   Lab Results  Component Value Date   MICROALBUR 0.5 10/30/2013   LDLCALC 83 11/04/2014   CREATININE 0.68 11/04/2014

## 2014-11-26 NOTE — Progress Notes (Signed)
Subjective:    Patient ID: Denise Beard, female    DOB: [birth year], 54 y.o.   MRN: [id number]  HPI  54 year old African-American female, with a history of type 2 diabetes, hyperlipidemia, insomnia is in today with complaints of a mass to her left shoulder and neck area present 3-4 weeks. Denies any tenderness to palpation. Reports a pulling sensation in her left neck when she turns her head. Has noticed that she's had more frequent headaches since this mass is been present. Denies any injury. No heavy lifting, bending, twisting or turning. Has not taken any medication for relief. Also has concerns of sinus congestion and sneezing 2 days. Denies any fever or chills. Has not taken any medication for relief. Symptoms stable.   Review of Systems  Constitutional: Negative.   Respiratory: Negative.   Cardiovascular: Negative.   Gastrointestinal: Negative.   Endocrine: Negative.   Genitourinary: Negative.   Musculoskeletal: Negative.   Skin:       Mass left shoulder  Allergic/Immunologic: Negative.   Neurological: Positive for headaches. Negative for dizziness, light-headedness and numbness.  Hematological: Negative.   Psychiatric/Behavioral: Negative.    Past Medical History  Diagnosis Date  . DIAB W/O COMP TYPE II/UNS NOT STATED UNCNTRL 04/06/2009  . ANXIETY 03/17/2007  . ASTHMA 06/26/2007  . GERD 06/26/2007  . CONSTIPATION, CHRONIC 02/27/2010  . Irritable bowel syndrome 02/27/2010  . UNSPEC POLYARTHROPATHY/POLYARTHRIT MX SITES 12/13/2009  . SEIZURE DISORDER 04/17/2010    History   Social History  . Marital Status: Divorced    Spouse Name: N/A  . Number of Children: N/A  . Years of Education: N/A   Occupational History  . Not on file.   Social History Main Topics  . Smoking status: Never Smoker   . Smokeless tobacco: Never Used  . Alcohol Use: No  . Drug Use: No  . Sexual Activity: Not on file   Other Topics Concern  . Not on file   Social History Narrative   Single   2 chldren   Works; Nurse, mental health for background checks     Past Surgical History  Procedure Laterality Date  . Cesarean section    . Tubal ligation    . Abdominal hysterectomy      Family History  Problem Relation Age of Onset  . Thyroid disease Mother   . Cancer Mother   . Hyperlipidemia Mother   . Cancer Maternal Grandfather     Allergies  Allergen Reactions  . Iodine     Current Outpatient Prescriptions on File Prior to Visit  Medication Sig Dispense Refill  . budesonide-formoterol (SYMBICORT) 80-4.5 MCG/ACT inhaler Inhale 2 puffs into the lungs 2 (two) times daily.      Marland Kitchen glipiZIDE (GLUCOTROL XL) 5 MG 24 hr tablet TAKE 1 TABLET (5 MG TOTAL) BY MOUTH 2x daily 60 tablet 5  . Insulin Glargine (LANTUS SOLOSTAR) 100 UNIT/ML Solostar Pen Inject 18 Units into the skin daily at 10 pm. (Patient taking differently: Inject 17 Units into the skin daily at 10 pm. ) 5 pen 2  . Insulin Pen Needle (CAREFINE PEN NEEDLES) 32G X 4 MM MISC Use 1x a day 100 each 2  . ipratropium (ATROVENT) 0.06 % nasal spray Place 2 sprays into the nose 3 (three) times daily. 15 mL 0  . JANUVIA 100 MG tablet TAKE 1 TABLET (100 MG TOTAL) BY MOUTH DAILY. 30 tablet 5  . metFORMIN (GLUCOPHAGE) 1000 MG tablet TAKE 1 TABLET (1,000 MG TOTAL) BY MOUTH 2 (  TWO) TIMES DAILY WITH A MEAL. 60 tablet 2  . ONE TOUCH ULTRA TEST test strip USE AS DIRECTED 100 each 6  . simvastatin (ZOCOR) 20 MG tablet TAKE 1 TABLET (20 MG TOTAL) BY MOUTH AT BEDTIME. 90 tablet 3  . zolpidem (AMBIEN) 10 MG tablet Take 1 tablet (10 mg total) by mouth at bedtime as needed. for sleep 30 tablet 0   No current facility-administered medications on file prior to visit.    BP 140/98 mmHg  Pulse 74  Temp(Src) 98 F (36.7 C) (Oral)  Wt 177 lb (80.287 kg)  SpO2 97%chart    Objective:   Physical Exam  Constitutional: She is oriented to person, place, and time. She appears well-developed and well-nourished.  HENT:  Right Ear:  External ear normal.  Left Ear: External ear normal.  Nose: Nose normal.  Mouth/Throat: Oropharynx is clear and moist.  Neck: Normal range of motion. Neck supple.    Cardiovascular: Normal rate, regular rhythm and normal heart sounds.   Pulmonary/Chest: Effort normal and breath sounds normal.  Neurological: She is alert and oriented to person, place, and time.  Skin: Skin is warm and dry.  Psychiatric: She has a normal mood and affect.          Assessment & Plan:  Denise Beard was seen today for follow-up and sinusitis.  Diagnoses and all orders for this visit:  Mass of skin of shoulder, left Orders: -     CT Soft Tissue Neck Wo Contrast; Future  Type 2 diabetes mellitus without complication  Pure hypercholesterolemia  Allergic rhinitis, unspecified allergic rhinitis type  Lipoma  Other orders -     fluticasone (FLONASE) 50 MCG/ACT nasal spray; Place 2 sprays into both nostrils daily.   CT scan of the soft tissue of the neck and shoulder ordered. Will notify patient pending results. Discus potential of this likely being a lipoma. Consider surgical referral since it is causing headaches.

## 2014-11-26 NOTE — Patient Instructions (Signed)
Lipoma  A lipoma is a noncancerous (benign) tumor composed of fat cells. They are usually found under the skin (subcutaneous). A lipoma may occur in any tissue of the body that contains fat. Common areas for lipomas to appear include the back, shoulders, buttocks, and thighs. Lipomas are a very common soft tissue growth. They are soft and grow slowly. Most problems caused by a lipoma depend on where it is growing.  DIAGNOSIS   A lipoma can be diagnosed with a physical exam. These tumors rarely become cancerous, but radiographic studies can help determine this for certain. Studies used may include:  · Computerized X-ray scans (CT or CAT scan).  · Computerized magnetic scans (MRI).  TREATMENT   Small lipomas that are not causing problems may be watched. If a lipoma continues to enlarge or causes problems, removal is often the best treatment. Lipomas can also be removed to improve appearance. Surgery is done to remove the fatty cells and the surrounding capsule. Most often, this is done with medicine that numbs the area (local anesthetic). The removed tissue is examined under a microscope to make sure it is not cancerous. Keep all follow-up appointments with your caregiver.  SEEK MEDICAL CARE IF:   · The lipoma becomes larger or hard.  · The lipoma becomes painful, red, or increasingly swollen. These could be signs of infection or a more serious condition.  Document Released: 04/27/2002 Document Revised: 07/30/2011 Document Reviewed: 10/07/2009  [employer]® Patient Information ©2015 [employer]. This information is not intended to replace advice given to you by your health care provider. Make sure you discuss any questions you have with your health care provider.

## 2014-12-02 ENCOUNTER — Telehealth: Payer: Self-pay | Admitting: Family Medicine

## 2014-12-02 ENCOUNTER — Ambulatory Visit (INDEPENDENT_AMBULATORY_CARE_PROVIDER_SITE_OTHER)
Admission: RE | Admit: 2014-12-02 | Discharge: 2014-12-02 | Disposition: A | Discharge status: Home / Self Care | Payer: Commercial Managed Care - PPO | Source: Ambulatory Visit | Attending: Family | Admitting: Family

## 2014-12-02 DIAGNOSIS — R2232 Localized swelling, mass and lump, left upper limb: Secondary | ICD-10-CM | POA: Diagnosis not present

## 2014-12-02 NOTE — Telephone Encounter (Signed)
[facility]  Patient Name: NANETTE WIRSING  DOB: [birth year]    Initial Comment Caller states she was in office on Friday for possible sinus infection, told it was allergies and has rx - not helping. Has pain, pressure, and pain in gums   Nurse Assessment  Nurse: Justine Null, RN, Rodena Piety Date/Time (Eastern Time): 12/02/2014 3:33:30 PM  Confirm and document reason for call. If symptomatic, describe symptoms. ---Caller states she was in office on Friday for possible sinus infection, told it was allergies and has rx - not helping. Has pain, pressure, and pain in gums caller stated that she has had no fevers and has been headaches and has stuffy nose at present and has had no cough  Has the patient traveled out of the country within the last 30 days? ---No  Does the patient require triage? ---Yes  Related visit to physician within the last 2 weeks? ---Yes  Does the PT have any chronic conditions? (i.e. diabetes, asthma, etc.) ---Yes  List chronic conditions. ---diabetes type II  Did the patient indicate they were pregnant? ---No     Guidelines    Guideline Title Affirmed Question Affirmed Notes  Headache [1] MODERATE headache (e.g., interferes with normal activities) AND [2] present > 24 hours AND [3] unexplained (Exceptions: analgesics not tried, typical migraine, or headache part of viral illness) pain rated at 6/10   Final Disposition User   See Physician within [place], RN, Rodena Piety    Comments  caller did nto want the triager to call the office and stated that she would call them and schedule an appointment   Referrals  REFERRED TO PCP OFFICE   Disagree/Comply: Comply

## 2014-12-02 NOTE — Telephone Encounter (Signed)
noted 

## 2014-12-13 ENCOUNTER — Encounter: Payer: Self-pay | Admitting: *Deleted

## 2014-12-13 ENCOUNTER — Other Ambulatory Visit: Payer: Self-pay | Admitting: Family

## 2015-01-14 ENCOUNTER — Encounter: Payer: Self-pay | Admitting: Internal Medicine

## 2015-01-14 ENCOUNTER — Ambulatory Visit (INDEPENDENT_AMBULATORY_CARE_PROVIDER_SITE_OTHER): Payer: Commercial Managed Care - PPO | Admitting: Internal Medicine

## 2015-01-14 VITALS — BP 124/82 | HR 85 | Temp 97.6°F | Resp 12 | Wt 173.6 lb

## 2015-01-14 DIAGNOSIS — E1165 Type 2 diabetes mellitus with hyperglycemia: Secondary | ICD-10-CM | POA: Diagnosis not present

## 2015-01-14 DIAGNOSIS — IMO0002 Reserved for concepts with insufficient information to code with codable children: Secondary | ICD-10-CM

## 2015-01-14 MED ORDER — METFORMIN HCL 1000 MG PO TABS: ORAL_TABLET | ORAL | Status: DC

## 2015-01-14 MED ORDER — INSULIN GLARGINE 100 UNIT/ML SOLOSTAR PEN: 20.0000 [IU] | PEN_INJECTOR | Freq: Every day | SUBCUTANEOUS | Status: DC

## 2015-01-14 MED ORDER — GLIPIZIDE ER 5 MG PO TB24: ORAL_TABLET | ORAL | Status: DC

## 2015-01-14 MED ORDER — SITAGLIPTIN PHOSPHATE 100 MG PO TABS: ORAL_TABLET | ORAL | Status: DC

## 2015-01-14 NOTE — Patient Instructions (Signed)
Please continue: - Januvia 100 mg in am - metformin 1000 mg 2x a day - Glipizide XL to 10 mg daily in am.   Please increase Lantus to 20 units at bedtime.  Please come back for a follow-up appointment in 3 months.

## 2015-01-14 NOTE — Progress Notes (Signed)
Subjective:     Patient ID: Denise Beard, female   DOB: 1960-08-01, 54 y.o.   MRN: [id number]  Diabetes   Denise Beard is a pleasant 53 y.o. woman, returning for f/u for DM2, dx 2012, insulin-dependent, uncontrolled, without complications. Last visit 3 mo ago.  She stopped exercise since last visit.   Her last hemoglobin A1c: Lab Results  Component Value Date   HGBA1C 7.6* 10/15/2014   HGBA1C 8.5* 07/08/2014   HGBA1C 6.7* 02/26/2014  Previously 7.9% (02/18/2012).   She is on:  - Januvia 100 mg in am - Metformin 1000 mg 2x a day - Glipizide XL 5 >> 10 mg in am - Lantus 17 units at bedtime. She was on saxagliptin/metformin XR 09/998 mg (Kombyglyze) in the past. She started on on JanuMet 50/1000 bid on 07/17/2013 >> lightheaded, HA, blurry vision.  She checks 2-3x a week - brings meter: - am: 133-194 >> 115-154 >> 114-150 >> 204-299 >> 109-165, 214 >> 144-174, 207, 236 >> 144-203 - 2h after b'fast: 189-288 >> n/c >> 180, 263 (checks only when feels bad) >> 206-375 >> n/c >> 179, 201 - before lunch: one value: 179 >> 150 >> n/c >> 177, 191 >> 153 >> n/c >> 161 >> 92-143 >> n/c - 2h after lunch: 120, 136, 169, 211 >> n/c >> 135 >> 248 >> 169, 257 >> 134-173-241 >> n/c - before dinner: 160 >> n/c >> 190 >> n/c >> 130, 153, 169 >> 113 >> 244 >> n/c >> 156-225 - 2h after dinner: 124, 190-274 >> 160-220, 319 >> 155-279, 319 - bedtime: 109-164 >> not checking >> 130-140 >> 152, 164, 233 >> 174 >> 217 >> n/c>> 129-242 >> see above Strips are expensive. Meter: One Touch Ultra 2  She had nutrition classes with [facility] education (nutrition) in the past.   - Last eye exam 09/07/2013. No DR. (Dr Macarthur Critchley [facility]) - no CKD; no neuropathy. ACR was 1.9 on 10/06/2012. Lab Results  Component Value Date   BUN 8 11/04/2014   CREATININE 0.68 11/04/2014   - Lipids:  Lab Results  Component Value Date   CHOL 139 11/04/2014   HDL 38.40* 11/04/2014   LDLCALC 83 11/04/2014   LDLDIRECT 179.6 07/17/2013   TRIG 90.0 11/04/2014   CHOLHDL 4 11/04/2014  She was on Zocor, now stopped.  She also has a history of anxiety; asthma; GERD; constipation; polyarthropathy; seizure disorder.   Review of Systems Constitutional: no weight gain/loss, no fatigue, no subjective hyperthermia/hypothermia Eyes: no blurry vision, no xerophthalmia ENT: no sore throat, no nodules palpated in throat, no dysphagia/odynophagia, no hoarseness Cardiovascular: no CP/SOB/palpitations/leg swelling Respiratory: no cough/SOB/wheezing Gastrointestinal: no N/V/D/C Musculoskeletal: no muscle/joint aches Skin: no rashes, no hair loss Neurological: no tremors/numbness/tingling/dizziness  I reviewed pt's medications, allergies, PMH, social hx, family hx, and changes were documented in the history of present illness. Otherwise, unchanged from my initial visit note.  Objective:   Physical Exam  BP 124/82 mmHg  Pulse 85  Temp(Src) 97.6 F (36.4 C) (Oral)  Resp 12  Wt 173 lb 9.6 oz (78.744 kg)  SpO2 97% Body mass index is 28.89 kg/(m^2).  Wt Readings from Last 3 Encounters:  01/14/15 173 lb 9.6 oz (78.744 kg)  11/26/14 177 lb (80.287 kg)  11/10/14 172 lb 12.8 oz (78.382 kg)  Constitutional: slightly overweight, in NAD Eyes: PERRLA, EOMI, no exophthalmos ENT: moist mucous membranes, no thyromegaly, no cervical lymphadenopathy Cardiovascular: RRR, No MRG Respiratory: CTA B Gastrointestinal:  abdomen soft, NT, ND, BS+ Skin: moist, warm Neuro: DTR normal in all 4  Assessment:     1. DM2, insulin-dependent, uncontrolled, without complications    Plan:     Pt with still high sugars but improving. We'll continue the current regimen, but I advised her to increase her Lantus. She also needs to continue to improve her diet. Invokana is an option at next visit if we need to intensify tx.  Patient Instructions  Please continue: - Januvia 100 mg in am - metformin 1000 mg 2x a day - Glipizide XL  to 10 mg daily in am.   Please increase Lantus to 20 units at bedtime.  Please come back for a follow-up appointment in 3 months.  - She needs another eye exam  - Will check a hemoglobin A1c today >> 7.0% (improved!) - Return in about 3 months (around 04/16/2015).

## 2015-02-03 ENCOUNTER — Ambulatory Visit (AMBULATORY_SURGERY_CENTER): Payer: Self-pay

## 2015-02-03 VITALS — Ht 65.0 in | Wt 170.0 lb

## 2015-02-03 DIAGNOSIS — Z800 Family history of malignant neoplasm of digestive organs: Secondary | ICD-10-CM

## 2015-02-03 MED ORDER — NA SULFATE-K SULFATE-MG SULF 17.5-3.13-1.6 GM/177ML PO SOLN: 1.0000 | Freq: Once | ORAL | Status: DC

## 2015-02-03 NOTE — Progress Notes (Signed)
Per pt, no allergies to soy or egg products.Pt not taking any weight loss meds or using  O2 at home. 

## 2015-02-04 ENCOUNTER — Ambulatory Visit (INDEPENDENT_AMBULATORY_CARE_PROVIDER_SITE_OTHER): Payer: Commercial Managed Care - PPO | Admitting: Family Medicine

## 2015-02-04 ENCOUNTER — Encounter: Payer: Self-pay | Admitting: Family Medicine

## 2015-02-04 VITALS — BP 138/94 | HR 80 | Temp 98.3°F | Wt 171.0 lb

## 2015-02-04 DIAGNOSIS — M1990 Unspecified osteoarthritis, unspecified site: Secondary | ICD-10-CM | POA: Insufficient documentation

## 2015-02-04 DIAGNOSIS — E785 Hyperlipidemia, unspecified: Secondary | ICD-10-CM | POA: Diagnosis not present

## 2015-02-04 DIAGNOSIS — E1165 Type 2 diabetes mellitus with hyperglycemia: Secondary | ICD-10-CM

## 2015-02-04 DIAGNOSIS — J4530 Mild persistent asthma, uncomplicated: Secondary | ICD-10-CM | POA: Diagnosis not present

## 2015-02-04 DIAGNOSIS — E1169 Type 2 diabetes mellitus with other specified complication: Secondary | ICD-10-CM | POA: Insufficient documentation

## 2015-02-04 DIAGNOSIS — G4700 Insomnia, unspecified: Secondary | ICD-10-CM

## 2015-02-04 DIAGNOSIS — R55 Syncope and collapse: Secondary | ICD-10-CM | POA: Insufficient documentation

## 2015-02-04 DIAGNOSIS — J309 Allergic rhinitis, unspecified: Secondary | ICD-10-CM | POA: Insufficient documentation

## 2015-02-04 DIAGNOSIS — IMO0002 Reserved for concepts with insufficient information to code with codable children: Secondary | ICD-10-CM

## 2015-02-04 LAB — MICROALBUMIN / CREATININE URINE RATIO
Creatinine,U: 205.4 mg/dL
MICROALB/CREAT RATIO: 2.4 mg/g (ref 0.0–30.0)
Microalb, Ur: 4.9 mg/dL — ABNORMAL HIGH (ref 0.0–1.9)

## 2015-02-04 MED ORDER — ZOLPIDEM TARTRATE 10 MG PO TABS: ORAL_TABLET | ORAL | Status: DC

## 2015-02-04 NOTE — Assessment & Plan Note (Signed)
S: mild intermittent controlled with Symbicort at baseline. Only using albuterol once a week A/P: continue current rx

## 2015-02-04 NOTE — Progress Notes (Signed)
Denise Reddish, MD  Subjective:  Denise Beard is a 54 y.o. year old very pleasant female patient who presents for/with See problem oriented charting ROS- no chest pain or shortness of breath (except when asthma flares). Denies hypoglycemia or blurry vision. Denies vivid dreams. Denies myalgias  Past Medical History-  Patient Active Problem List   Diagnosis Date Noted  . Type 2 diabetes mellitus, uncontrolled 04/06/2009    Priority: High  . Hyperlipidemia 02/04/2015    Priority: Medium  . Insomnia 02/04/2015    Priority: Medium  . Asthma 06/26/2007    Priority: Medium  . Osteoarthritis 02/04/2015    Priority: Low  . Vasovagal syncope 02/04/2015    Priority: Low  . Allergic rhinitis 02/04/2015    Priority: Low  . Irritable bowel syndrome 02/27/2010    Priority: Low  . GERD 06/26/2007    Priority: Low   Medications- reviewed and updated Current Outpatient Prescriptions  Medication Sig Dispense Refill  . budesonide-formoterol (SYMBICORT) 80-4.5 MCG/ACT inhaler Inhale 2 puffs into the lungs 2 (two) times daily.      Marland Kitchen glipiZIDE (GLUCOTROL XL) 5 MG 24 hr tablet TAKE 1 TABLET (5 MG TOTAL) BY MOUTH 2x daily 60 tablet 5  . Insulin Glargine (LANTUS SOLOSTAR) 100 UNIT/ML Solostar Pen Inject 20 Units into the skin daily at 10 pm. 5 pen 2  . Insulin Pen Needle (CAREFINE PEN NEEDLES) 32G X 4 MM MISC Use 1x a day 100 each 2  . metFORMIN (GLUCOPHAGE) 1000 MG tablet TAKE 1 TABLET (1,000 MG TOTAL) BY MOUTH 2 (TWO) TIMES DAILY WITH A MEAL. 60 tablet 5  . Na Sulfate-K Sulfate-Mg Sulf (SUPREP BOWEL PREP) SOLN Take 1 kit by mouth once. 354 mL 0  . ONE TOUCH ULTRA TEST test strip USE AS DIRECTED 100 each 6  . simvastatin (ZOCOR) 20 MG tablet TAKE 1 TABLET (20 MG TOTAL) BY MOUTH AT BEDTIME. 90 tablet 3  . sitaGLIPtin (JANUVIA) 100 MG tablet TAKE 1 TABLET (100 MG TOTAL) BY MOUTH DAILY. 30 tablet 5  . fluticasone (FLONASE) 50 MCG/ACT nasal spray Place 2 sprays into both nostrils daily. (Patient not  taking: Reported on 02/03/2015) 16 g 6  . zolpidem (AMBIEN) 10 MG tablet TAKE 1 TABLET BY MOUTH AT BEDTIME AS NEEDED SLEEP 30 tablet 5   Objective: BP 138/94 mmHg  Pulse 80  Temp(Src) 98.3 F (36.8 C)  Wt 171 lb (77.565 kg) Gen: NAD, resting comfortably CV: RRR no murmurs rubs or gallops Lungs: CTAB no crackles, wheeze, rhonchi Abdomen: soft/nontender/nondistended/normal bowel sounds. No rebound or guarding.  Ext: no edema, 2+ PT pulses Skin: warm, dry Neuro: grossly normal, moves all extremities  Assessment/Plan:  Type 2 diabetes mellitus, uncontrolled S: working towards control. Reports POC a1c close to 7 at Dr. Arman Filter office which I cannot see. She is compliant with Metformin 1031m BID, glipizide 592mXL, Lantus 20 units, januvia. Exercise is lacking A/P: encouraged healthy lifestyles including increasing exercise. Also get microalb/cr ratio, otherwise doing well under Dr. GhArman Filterare.    Insomnia S: controlled on ambien 1032m/P: continue current rx but we discussed 5mg36m age 51 g77en age and sex indications   Hyperlipidemia S: controlled. With LDL 83 in June 2016 on simvastatin 20mg33m:Continue current meds. HDL slightly low- encouraged increased exercise   Asthma S: mild intermittent controlled with Symbicort at baseline. Only using albuterol once a week A/P: continue current rx   Keep an eye on BP- no diagnosis of Hypertension but DBP elevated  today, 4 month BP check- if still elevated consider home monitoring. Encouraged increased exercise BP Readings from Last 3 Encounters:  02/04/15 138/94  01/14/15 124/82   Orders Placed This Encounter  Procedures  . Microalbumin/Creatinine Ratio, Urine   Meds ordered this encounter  Medications  . zolpidem (AMBIEN) 10 MG tablet    Sig: TAKE 1 TABLET BY MOUTH AT BEDTIME AS NEEDED SLEEP    Dispense:  30 tablet    Refill:  5

## 2015-02-04 NOTE — Patient Instructions (Signed)
Medication Instructions:  No changes  Other Instructions:  Star exercise back. Goal 150 minutes a week.   Labwork: Just a urine test before you go  Testing/Procedures/Immunizations: Health Maintenance Due  Topic Date Due  . Hepatitis C Screening - declined 1961/03/11  . HIV Screening - declined 05/30/1977  . COLONOSCOPY - already set up  05/30/2012  . OPHTHALMOLOGY EXAM - send [place] a copy 09/08/2014  . URINE MICROALBUMIN - today 10/31/2014  . INFLUENZA VACCINE - set up next week at job 12/20/2014   Follow-Up (all visit scheduling, rescheduling, cancellations including labs should be scheduled at front desk): 4 month blood pressure recheck After 11/13/14 repeat physical.   Sooner if you need [place] or if you have new or worsening symptoms

## 2015-02-04 NOTE — Assessment & Plan Note (Signed)
S: controlled on ambien 10mg  A/P: continue current rx but we discussed 5mg  at age 54 given age and sex indications

## 2015-02-04 NOTE — Assessment & Plan Note (Signed)
S: controlled. With LDL 83 in June 2016 on simvastatin 20mg  A/P:Continue current meds. HDL slightly low- encouraged increased exercise

## 2015-02-04 NOTE — Assessment & Plan Note (Signed)
S: working towards control. Reports POC a1c close to 7 at Dr. Arman Filter office which I cannot see. She is compliant with Metformin 1000mg  BID, glipizide 5mg  XL, Lantus 20 units, januvia. Exercise is lacking A/P: encouraged healthy lifestyles including increasing exercise. Also get microalb/cr ratio, otherwise doing well under Dr. Arman Filter care.

## 2015-02-09 ENCOUNTER — Other Ambulatory Visit: Payer: Self-pay | Admitting: Internal Medicine

## 2015-02-17 ENCOUNTER — Encounter: Payer: Self-pay | Admitting: Gastroenterology

## 2015-02-17 ENCOUNTER — Ambulatory Visit (AMBULATORY_SURGERY_CENTER): Payer: Commercial Managed Care - PPO | Admitting: Gastroenterology

## 2015-02-17 VITALS — BP 143/93 | HR 62 | Temp 97.2°F | Resp 17 | Ht 65.0 in | Wt 170.0 lb

## 2015-02-17 DIAGNOSIS — Z1211 Encounter for screening for malignant neoplasm of colon: Secondary | ICD-10-CM

## 2015-02-17 MED ORDER — SODIUM CHLORIDE 0.9 % IV SOLN: 500.0000 mL | INTRAVENOUS | Status: DC

## 2015-02-17 NOTE — Op Note (Signed)
[facility]  [address] [place], [address]   COLONOSCOPY PROCEDURE REPORT  PATIENT: Denise Beard, Denise Beard  MR#: [id number] BIRTHDATE: 1960/06/27 , 54  yrs. old GENDER: female ENDOSCOPIST: Ladene Artist, MD, [facility] REFERRED [id number] Kristian Covey, M.D. PROCEDURE DATE:  02/17/2015 PROCEDURE:   Colonoscopy, screening First Screening Colonoscopy - Avg.  risk and is 50 yrs.  old or older Yes.  Prior Negative Screening - Now for repeat screening. N/A  History of Adenoma - Now for follow-up colonoscopy & has been > or = to 3 yrs.  N/A  Polyps removed today? No Recommend repeat exam, <10 yrs? No ASA CLASS:   Class II INDICATIONS:Screening for colonic neoplasia and Colorectal Neoplasm Risk Assessment for this procedure is average risk. MEDICATIONS: Monitored anesthesia care and Propofol 200 mg IV DESCRIPTION OF PROCEDURE:   After the risks benefits and alternatives of the procedure were thoroughly explained, informed consent was obtained.  The digital rectal exam revealed no abnormalities of the rectum.   The LB [id number]-[id number]  endoscope was introduced through the anus and advanced to the cecum, which was identified by both the appendix and ileocecal valve. No adverse events experienced.   The quality of the prep was good.  (Suprep was used)  The instrument was then slowly withdrawn as the colon was fully examined. Estimated blood loss is zero unless otherwise noted in this procedure report.    COLON FINDINGS: A normal appearing cecum, ileocecal valve, and appendiceal orifice were identified.  The ascending, transverse, descending, sigmoid colon, and rectum appeared unremarkable. Retroflexed views revealed no abnormalities. The time to cecum = 1.3 Withdrawal time = 10.9   The scope was withdrawn and the procedure completed. COMPLICATIONS: There were no immediate complications.  ENDOSCOPIC IMPRESSION: Normal colonoscopy  RECOMMENDATIONS: Continue current colorectal  screening recommendations for "routine risk" patients with a repeat colonoscopy in 10 years.  eSigned:  Ladene Artist, MD, [facility] 02/17/2015 11:42 AM

## 2015-02-17 NOTE — Progress Notes (Signed)
  [place] Post-op Note  Patient: Denise Beard  Procedure(s) Performed: colonoscopy  Patient Location: [facility] - Recovery Area  Anesthesia Type: Deep Sedation/Propofol  Level of Consciousness: awake, oriented and patient cooperative  Airway and Oxygen Therapy: Patient Spontanous Breathing  Post-op Pain: none  Post-op Assessment:  Post-op Vital signs reviewed, Patient's Cardiovascular Status Stable, Respiratory Function Stable, Patent Airway, No signs of Nausea or vomiting and Pain level controlled  Post-op Vital Signs: Reviewed and stable  Complications: No apparent anesthesia complications  EARGLE, BETH E 11:46 AM

## 2015-02-17 NOTE — Patient Instructions (Signed)
YOU HAD AN ENDOSCOPIC PROCEDURE TODAY AT THE [facility]:   Refer to the procedure report that was given to you for any specific questions about what was found during the examination.  If the procedure report does not answer your questions, please call your gastroenterologist to clarify.  If you requested that your care partner not be given the details of your procedure findings, then the procedure report has been included in a sealed envelope for you to review at your convenience later.  YOU SHOULD EXPECT: Some feelings of bloating in the abdomen. Passage of more gas than usual.  Walking can help get rid of the air that was put into your GI tract during the procedure and reduce the bloating. If you had a lower endoscopy (such as a colonoscopy or flexible sigmoidoscopy) you may notice spotting of blood in your stool or on the toilet paper. If you underwent a bowel prep for your procedure, you may not have a normal bowel movement for a few days.  Please Note:  You might notice some irritation and congestion in your nose or some drainage.  This is from the oxygen used during your procedure.  There is no need for concern and it should clear up in a day or so.  SYMPTOMS TO REPORT IMMEDIATELY:   Following lower endoscopy (colonoscopy or flexible sigmoidoscopy):  Excessive amounts of blood in the stool  Significant tenderness or worsening of abdominal pains  Swelling of the abdomen that is new, acute  Fever of 100F or higher   For urgent or emergent issues, a gastroenterologist can be reached at any hour by calling [phone].   DIET: Your first meal following the procedure should be a small meal and then it is ok to progress to your normal diet. Heavy or fried foods are harder to digest and may make you feel nauseous or bloated.  Likewise, meals heavy in dairy and vegetables can increase bloating.  Drink plenty of fluids but you should avoid alcoholic beverages for 24  hours.  ACTIVITY:  You should plan to take it easy for the rest of today and you should NOT DRIVE or use heavy machinery until tomorrow (because of the sedation medicines used during the test).    FOLLOW UP: Our staff will call the number listed on your records the next business day following your procedure to check on you and address any questions or concerns that you may have regarding the information given to you following your procedure. If we do not reach you, we will leave a message.  However, if you are feeling well and you are not experiencing any problems, there is no need to return our call.  We will assume that you have returned to your regular daily activities without incident.  If any biopsies were taken you will be contacted by phone or by letter within the next 1-3 weeks.  Please call us at [phone] if you have not heard about the biopsies in 3 weeks.    SIGNATURES/CONFIDENTIALITY: You and/or your care partner have signed paperwork which will be entered into your electronic medical record.  These signatures attest to the fact that that the information above on your After Visit Summary has been reviewed and is understood.  Full responsibility of the confidentiality of this discharge information lies with you and/or your care-partner.   Resume medications. 

## 2015-02-18 ENCOUNTER — Telehealth: Payer: Self-pay | Admitting: *Deleted

## 2015-02-18 NOTE — Telephone Encounter (Signed)
  Follow up Call-  Call back number 02/17/2015  Post procedure Call Back phone  # [phone]  Permission to leave phone message Yes     Patient questions:  Do you have a fever, pain , or abdominal swelling? No. Pain Score  0 *  Have you tolerated food without any problems? Yes.    Have you been able to return to your normal activities? Yes.    Do you have any questions about your discharge instructions: Diet   No. Medications  No. Follow up visit  No.  Do you have questions or concerns about your Care? No.  Actions: * If pain score is 4 or above: No action needed, pain <4.

## 2015-04-21 ENCOUNTER — Other Ambulatory Visit (INDEPENDENT_AMBULATORY_CARE_PROVIDER_SITE_OTHER): Payer: Commercial Managed Care - PPO | Admitting: *Deleted

## 2015-04-21 DIAGNOSIS — IMO0001 Reserved for inherently not codable concepts without codable children: Secondary | ICD-10-CM

## 2015-04-21 DIAGNOSIS — E1165 Type 2 diabetes mellitus with hyperglycemia: Secondary | ICD-10-CM | POA: Diagnosis not present

## 2015-04-21 LAB — POCT GLYCOSYLATED HEMOGLOBIN (HGB A1C): Hemoglobin A1C: 7

## 2015-04-22 ENCOUNTER — Other Ambulatory Visit (INDEPENDENT_AMBULATORY_CARE_PROVIDER_SITE_OTHER): Payer: Commercial Managed Care - PPO | Admitting: *Deleted

## 2015-04-22 ENCOUNTER — Ambulatory Visit (INDEPENDENT_AMBULATORY_CARE_PROVIDER_SITE_OTHER): Payer: Commercial Managed Care - PPO | Admitting: Internal Medicine

## 2015-04-22 ENCOUNTER — Encounter: Payer: Self-pay | Admitting: Internal Medicine

## 2015-04-22 VITALS — BP 112/68 | HR 86 | Temp 98.2°F | Resp 12 | Wt 177.4 lb

## 2015-04-22 DIAGNOSIS — IMO0001 Reserved for inherently not codable concepts without codable children: Secondary | ICD-10-CM

## 2015-04-22 DIAGNOSIS — E1165 Type 2 diabetes mellitus with hyperglycemia: Secondary | ICD-10-CM | POA: Diagnosis not present

## 2015-04-22 DIAGNOSIS — Z794 Long term (current) use of insulin: Secondary | ICD-10-CM | POA: Diagnosis not present

## 2015-04-22 LAB — POCT GLYCOSYLATED HEMOGLOBIN (HGB A1C): HEMOGLOBIN A1C: 7

## 2015-04-22 MED ORDER — INSULIN GLARGINE 100 UNIT/ML SOLOSTAR PEN: 25.0000 [IU] | PEN_INJECTOR | Freq: Every day | SUBCUTANEOUS | Status: DC

## 2015-04-22 NOTE — Progress Notes (Signed)
Subjective:     Patient ID: Denise Beard, female   DOB: Aug 07, 1960, 54 y.o.   MRN: [id number]  Diabetes   Denise Beard is a pleasant 54 y.o. woman, returning for f/u for DM2, dx 2012, insulin-dependent, uncontrolled, without complications. Last visit 3 mo ago.  Her last hemoglobin A1c: Lab Results  Component Value Date   HGBA1C 7.0 01/14/2015   HGBA1C 7.6* 10/15/2014   HGBA1C 8.5* 07/08/2014  Previously 7.9% (02/18/2012).   She is on:  - Januvia 100 mg in am - Metformin 1000 mg 2x a day - Glipizide XL 5 >> 10 mg in am - Lantus 17 >> 20 units at bedtime. She was on saxagliptin/metformin XR 09/998 mg (Kombyglyze) in the past. She started on on JanuMet 50/1000 bid on 07/17/2013 >> lightheaded, HA, blurry vision.  She checks 2-3x a week - brings meter: - am: 133-194 >> 115-154 >> 114-150 >> 204-299 >> 109-165, 214 >> 144-174, 207, 236 >> 144-203 >> 112, 123, 145-218, 231 - 2h after b'fast: 189-288 >> n/c >> 180, 263 (checks only when feels bad) >> 206-375 >> n/c >> 179, 201 >> n/c - before lunch: one value: 179 >> 150 >> n/c >> 177, 191 >> 153 >> n/c >> 161 >> 92-143 >> n/c>> 134, 160, 243 - 2h after lunch: 120, 136, 169, 211 >> n/c >> 135 >> 248 >> 169, 257 >> 134-173-241 >> n/c >> 178 - before dinner: 160 >> n/c >> 190 >> n/c >> 130, 153, 169 >> 113 >> 244 >> n/c >> 156-225 >> n/c >> 141-278 - 2h after dinner: 124, 190-274 >> 160-220, 319 >> 155-279, 319 >> 164-240, 350 (Thanksgiving, icecream) - bedtime: 109-164 >> not checking >> 130-140 >> 152, 164, 233 >> 174 >> 217 >> n/c>> 129-242 >> see above Strips are expensive. Meter: One Touch Ultra 2  She had nutrition classes with Db education (nutrition) in the past.   - Last eye exam 09/07/2013. No DR. (Dr Macarthur Critchley [facility]) - no CKD; no neuropathy.  Lab Results  Component Value Date   BUN 8 11/04/2014   CREATININE 0.68 11/04/2014  ACR was 1.9 on 10/06/2012. - Lipids:  Lab Results  Component Value Date   CHOL 139 11/04/2014   HDL 38.40* 11/04/2014   LDLCALC 83 11/04/2014   LDLDIRECT 179.6 07/17/2013   TRIG 90.0 11/04/2014   CHOLHDL 4 11/04/2014  She is on Zocor.  She also has a history of anxiety; asthma; GERD; constipation; polyarthropathy; seizure disorder.   Review of Systems Constitutional: no weight gain/loss, no fatigue, no subjective hyperthermia/hypothermia Eyes: no blurry vision, no xerophthalmia ENT: no sore throat, no nodules palpated in throat, no dysphagia/odynophagia, no hoarseness Cardiovascular: no CP/SOB/palpitations/leg swelling Respiratory: no cough/SOB/wheezing Gastrointestinal: no N/V/D/C Musculoskeletal: no muscle/joint aches Skin: no rashes, no hair loss Neurological: no tremors/numbness/tingling/dizziness  I reviewed pt's medications, allergies, PMH, social hx, family hx, and changes were documented in the history of present illness. Otherwise, unchanged from my initial visit note.  Objective:   Physical Exam  BP 112/68 mmHg  Pulse 86  Temp(Src) 98.2 F (36.8 C) (Oral)  Resp 12  Wt 177 lb 6.4 oz (80.468 kg)  SpO2 96% Body mass index is 29.52 kg/(m^2).  Wt Readings from Last 3 Encounters:  04/22/15 177 lb 6.4 oz (80.468 kg)  02/17/15 170 lb (77.111 kg)  02/04/15 171 lb (77.565 kg)  Constitutional: slightly overweight, in NAD Eyes: PERRLA, EOMI, no exophthalmos ENT: moist mucous membranes, no thyromegaly,  no cervical lymphadenopathy Cardiovascular: RRR, No MRG Respiratory: CTA B Gastrointestinal: abdomen soft, NT, ND, BS+ Skin: moist, warm Neuro: DTR normal in all 4  Assessment:     1. DM2, insulin-dependent, uncontrolled, without complications    Plan:     Pt with still high sugars despite increasing her Lantus. We check her hemoglobin A1c today >> 7.0% stable, but not concordant with log values. We checked her sugars in the office with our glucometer: 122, and also with hers: 120. I'm not sure about the cause for the discrepancy between her  sugars and the A1c. We may need to check a fructosamine at next visit. For now, since her highest sugars are after dinner, I advised her to take 5 mg of glipizide immediate release before this meal. If this is not helping enough, I also advised her to increase the Lantus further. I suggested iNVOKANA, but she refuses at this time. I'm trying to avoid mealtime insulin... I advised her to: Patient Instructions  Please continue: - Januvia 100 mg in am - Metformin 1000 mg 2x a day - Glipizide XL 10 mg in am  Add: - Glipizide XL 2x 1/2 tab of Glipizide XL before dinner (15-30 min before dinner)  Increase: - Lantus to 23, then 25 units at bedtime  Please return in 1.5 months with your sugar log.  - She needs another eye exam >> again advised her to schedule - She is up-to-date with her flu shot - Return in about 6 weeks (around 06/03/2015).

## 2015-04-22 NOTE — Patient Instructions (Signed)
Please continue: - Januvia 100 mg in am - Metformin 1000 mg 2x a day - Glipizide XL 10 mg in am  Add: - Glipizide XL 2x 1/2 tab of Glipizide XL before dinner (15-30 min before dinner)  Increase: - Lantus to 23, then 25 units at bedtime  Please return in 1.5 months with your sugar log.

## 2015-05-06 ENCOUNTER — Ambulatory Visit: Payer: Commercial Managed Care - PPO | Admitting: Family Medicine

## 2015-06-17 ENCOUNTER — Ambulatory Visit (INDEPENDENT_AMBULATORY_CARE_PROVIDER_SITE_OTHER): Payer: Commercial Managed Care - PPO | Admitting: Internal Medicine

## 2015-06-17 ENCOUNTER — Encounter: Payer: Self-pay | Admitting: Internal Medicine

## 2015-06-17 DIAGNOSIS — E119 Type 2 diabetes mellitus without complications: Secondary | ICD-10-CM

## 2015-06-17 DIAGNOSIS — Z794 Long term (current) use of insulin: Secondary | ICD-10-CM

## 2015-06-17 MED ORDER — ALBIGLUTIDE 30 MG ~~LOC~~ PEN: PEN_INJECTOR | SUBCUTANEOUS | Status: DC

## 2015-06-17 MED ORDER — INSULIN PEN NEEDLE 32G X 4 MM MISC: Status: DC

## 2015-06-17 NOTE — Patient Instructions (Signed)
Please continue: - Metformin 1000 mg 2x a day with meals  Increase: - Lantus to 25 units at bedtime - Glipizide XL 10 mg in am and 2x 1/2 tablet (5 mg) before dinner  Add: - Tanzeum 30 mg once a week  Please return in 3 months with your sugar log.

## 2015-06-17 NOTE — Progress Notes (Signed)
Subjective:     Patient ID: Denise Beard, female   DOB: 03-28-1961, 55 y.o.   MRN: [id number]  Diabetes   Denise Beard is a pleasant 55 y.o. woman, returning for f/u for DM2, dx 2012, insulin-dependent, uncontrolled, without complications. Last visit 3 mo ago.  Her last hemoglobin A1c: Lab Results  Component Value Date   HGBA1C 7.0 04/22/2015   HGBA1C 7.0 01/14/2015   HGBA1C 7.6* 10/15/2014  Previously 7.9% (02/18/2012).   She is on:  - Januvia 100 mg in am - Metformin 1000 mg 2x a day - Glipizide XL 10 mg in am and 2.5 mg with dinner - Lantus 22 units at bedtime. She was on saxagliptin/metformin XR 09/998 mg (Kombyglyze) in the past. She started on on JanuMet 50/1000 bid on 07/17/2013 >> lightheaded, HA, blurry vision.  She checks 2-3x a week - brings meter: - am: 204-299 >> 109-165, 214 >> 144-174, 207, 236 >> 144-203 >> 112, 123, 145-218, 231 >> 113, 146-218, 225 - 2h after b'fast: 180, 263 (checks only when feels bad) >> 206-375 >> n/c >> 179, 201 >> n/c >> 177-294 - before lunch: 150 >> n/c >> 177, 191 >> 153 >> n/c >> 161 >> 92-143 >> n/c>> 134, 160, 243 >> 212 - 2h after lunch: 120, 136, 169, 211 >> n/c >> 135 >> 248 >> 169, 257 >> 134-173-241 >> n/c >> 178 >> 141-254 - before dinner: 190 >> n/c >> 130, 153, 169 >> 113 >> 244 >> n/c >> 156-225 >> n/c >> 141-278 >> 137-143, 255 - 2h after dinner: 124, 190-274 >> 160-220, 319 >> 155-279, 319 >> 164-240, 350 >> 182-304 - bedtime: 130-140 >> 152, 164, 233 >> 174 >> 217 >> n/c>> 129-242 >> see above >> 200-294 Strips are expensive. Meter: One Touch Ultra 2  She had nutrition classes with Db education (nutrition) in the past.   - Last eye exam 09/07/2013. No DR. (Dr Macarthur Crit[facility]ley [facility]) - no CKD; no neuropathy.  Lab Results  Component Value Date   BUN 8 11/04/2014   CREATININE 0.68 11/04/2014  ACR was 1.9 on 10/06/2012. - Lipids:  Lab Results  Component Value Date   CHOL 139 11/04/2014   HDL 38.40*  11/04/2014   LDLCALC 83 11/04/2014   LDLDIRECT 179.6 07/17/2013   TRIG 90.0 11/04/2014   CHOLHDL 4 11/04/2014  She is on Zocor.  She also has a history of anxiety; asthma; GERD; constipation; polyarthropathy; seizure disorder.   Review of Systems Constitutional: no weight gain/loss, no fatigue, no subjective hyperthermia/hypothermia Eyes: no blurry vision, no xerophthalmia ENT: no sore throat, no nodules palpated in throat, no dysphagia/odynophagia, no hoarseness Cardiovascular: no CP/SOB/palpitations/leg swelling Respiratory: no cough/SOB/wheezing Gastrointestinal: no N/V/D/C Musculoskeletal: no muscle/joint aches Skin: no rashes, no hair loss Neurological: no tremors/numbness/tingling/dizziness  I reviewed pt's medications, allergies, PMH, social hx, family hx, and changes were documented in the history of present illness. Otherwise, unchanged from my initial visit note.  Objective:   Physical Exam  BP 122/64 mmHg  Pulse 87  Temp(Src) 97.9 F (36.6 C) (Oral)  Resp 12  Wt 172 lb 12.8 oz (78.382 kg)  SpO2 95% Body mass index is 28.76 kg/(m^2).  Wt Readings from Last 3 Encounters:  06/17/15 172 lb 12.8 oz (78.382 kg)  04/22/15 177 lb 6.4 oz (80.468 kg)  02/17/15 170 lb (77.111 kg)  Constitutional: slightly overweight, in NAD Eyes: PERRLA, EOMI, no exophthalmos ENT: moist mucous membranes, no thyromegaly, no cervical lymphadenopathy Cardiovascular:  RRR, No MRG Respiratory: CTA B Gastrointestinal: abdomen soft, NT, ND, BS+ Skin: moist, warm Neuro: DTR normal in all 4  Assessment:     1. DM2, insulin-dependent, uncontrolled, without complications    Plan:     Pt with still high sugars despite increasing her Lantus. We check her hemoglobin A1c today >> 7.0% stable, but not concordant with log values.  We may need to check a fructosamine at next visit. For now, Will increase Lantus and dinnertime Glipizide and add Tanzeum to replace Januvia. If this is not working better  >> needs mealtime insulin. I showed her thre VGo system today >> this would be a good option for her,I think. - I advised her to: Patient Instructions  Please continue: - Metformin 1000 mg 2x a day with meals  Increase: - Lantus to 25 units at bedtime - Glipizide XL 10 mg in am and 2x 1/2 tablet (5 mg) before dinner  Add: - Tanzeum 30 mg once a week  Please return in 3 months with your sugar log.   - She needs another eye exam >> again advised her to schedule - She is up-to-date with her flu shot - Return in about 3 months (around 09/15/2015).

## 2015-07-12 ENCOUNTER — Telehealth: Payer: Self-pay | Admitting: Internal Medicine

## 2015-07-12 MED ORDER — GLIPIZIDE ER 5 MG PO TB24: ORAL_TABLET | ORAL | Status: DC

## 2015-07-12 NOTE — Telephone Encounter (Signed)
Patient called and would like a Rx refill   DL:[id number]   Pharmacy: [facility] [address]    Thank you

## 2015-07-12 NOTE — Telephone Encounter (Signed)
Refill sent to pt's pharmacy. Dosage change per Dr Arman Filter last office note.

## 2015-07-20 ENCOUNTER — Telehealth: Payer: Self-pay | Admitting: *Deleted

## 2015-07-20 NOTE — Telephone Encounter (Signed)
Pt called stating that every since she started the Tanzeum she has had lows, which concerns her. She is feeling dizzy, light headed, disoriented and confused at times this week. Advised pt that the Dr was out of the office this afternoon and will return tomorrow. Please advise.

## 2015-07-21 ENCOUNTER — Other Ambulatory Visit: Payer: Self-pay | Admitting: Internal Medicine

## 2015-07-21 NOTE — Telephone Encounter (Signed)
Called pt and advised her per Dr Arman Filter message. Pt voiced understanding and will let us know how she is doing.

## 2015-07-21 NOTE — Telephone Encounter (Signed)
Do not stop Tanzeum! Decrease Glipizide ER to only 5 mg in am and do not take the dinnertime 2.5 mg of this. If still lows, stop Glipizide ER completely. Call back in few days to let us know how her sugars are doing.

## 2015-08-01 ENCOUNTER — Other Ambulatory Visit: Payer: Self-pay | Admitting: Family Medicine

## 2015-08-01 NOTE — Telephone Encounter (Signed)
Yes thanks 

## 2015-08-01 NOTE — Telephone Encounter (Signed)
Refill ok? 

## 2015-09-05 ENCOUNTER — Other Ambulatory Visit: Payer: Self-pay | Admitting: Internal Medicine

## 2015-09-12 ENCOUNTER — Ambulatory Visit (INDEPENDENT_AMBULATORY_CARE_PROVIDER_SITE_OTHER): Payer: Commercial Managed Care - PPO | Admitting: Family Medicine

## 2015-09-12 ENCOUNTER — Ambulatory Visit (INDEPENDENT_AMBULATORY_CARE_PROVIDER_SITE_OTHER)
Admission: RE | Admit: 2015-09-12 | Discharge: 2015-09-12 | Disposition: A | Discharge status: Home / Self Care | Payer: Commercial Managed Care - PPO | Source: Ambulatory Visit | Attending: Family Medicine | Admitting: Family Medicine

## 2015-09-12 ENCOUNTER — Encounter: Payer: Self-pay | Admitting: Family Medicine

## 2015-09-12 VITALS — BP 110/70 | HR 72 | Temp 98.3°F | Wt 170.0 lb

## 2015-09-12 DIAGNOSIS — M79672 Pain in left foot: Secondary | ICD-10-CM | POA: Diagnosis not present

## 2015-09-12 NOTE — Progress Notes (Signed)
Subjective:  Denise Beard is a 55 y.o. year old very pleasant female patient who presents for/with See problem oriented charting ROS- no fever, chills, expanding redness, nausea, vomiting  Past Medical History-  Patient Active Problem List   Diagnosis Date Noted  . Type 2 diabetes mellitus, uncontrolled ([place]) 04/06/2009    Priority: High  . Hyperlipidemia 02/04/2015    Priority: Medium  . Insomnia 02/04/2015    Priority: Medium  . Asthma 06/26/2007    Priority: Medium  . Osteoarthritis 02/04/2015    Priority: Low  . Vasovagal syncope 02/04/2015    Priority: Low  . Allergic rhinitis 02/04/2015    Priority: Low  . Irritable bowel syndrome 02/27/2010    Priority: Low  . GERD 06/26/2007    Priority: Low  . Controlled type 2 diabetes mellitus without complication, with long-term current use of insulin ([place]) 06/17/2015    Medications- reviewed and updated Current Outpatient Prescriptions  Medication Sig Dispense Refill  . Albiglutide (TANZEUM) 30 MG PEN Inject 30 mg once a week 4 each 2  . budesonide-formoterol (SYMBICORT) 80-4.5 MCG/ACT inhaler Inhale 2 puffs into the lungs 2 (two) times daily.      . fluticasone (FLONASE) 50 MCG/ACT nasal spray Place 2 sprays into both nostrils daily. 16 g 6  . glipiZIDE (GLUCOTROL XL) 5 MG 24 hr tablet Take 1 tablet (5 mg total) by mouth daily with breakfast. 90 tablet 0  . Insulin Glargine (LANTUS SOLOSTAR) 100 UNIT/ML Solostar Pen Inject 25 Units into the skin daily at 10 pm. 5 pen 2  . Insulin Pen Needle (CAREFINE PEN NEEDLES) 32G X 4 MM MISC Use 1x a day 100 each 2  . metFORMIN (GLUCOPHAGE) 1000 MG tablet TAKE 1 TABLET (1,000 MG TOTAL) BY MOUTH 2 (TWO) TIMES DAILY WITH A MEAL. 60 tablet 2  . ONE TOUCH ULTRA TEST test strip USE AS DIRECTED 100 each 6  . simvastatin (ZOCOR) 20 MG tablet TAKE 1 TABLET (20 MG TOTAL) BY MOUTH AT BEDTIME. 90 tablet 3  . sitaGLIPtin (JANUVIA) 100 MG tablet TAKE 1 TABLET (100 MG TOTAL) BY MOUTH DAILY. 30 tablet 5   . zolpidem (AMBIEN) 10 MG tablet TAKE 1 TABLET BY MOUTH AT BEDTIME AS NEEDED FOR SLEEP 30 tablet 5   No current facility-administered medications for this visit.    Objective: BP 110/70 mmHg  Pulse 72  Temp(Src) 98.3 F (36.8 C)  Wt 170 lb (77.111 kg) Gen: NAD, resting comfortably CV: RRR Lungs: nonlabored Ext: no edema, no significant swelling on left foot Skin: warm, dry, no significant bruising Neuro: grossly normal, moves all extremities, intact distal sensation  Palpated throughout left foot- pain is focused in distal metatarsals on 3rd and 4th toes. Minimal pain in other areas. Patient walks with a limp when walking on that foot  Left hip and knee exam normal  Assessment/Plan:  Left foot pain - Plan: DG Foot Complete Left S: she was opening bookshelf last night when the shelf somehow tipped (at least 5 feet tall and full of books) and fell on her left foot (the corner) a sshe tried to get out of way. Immediate pain and wanted to cry. Pain centered around distal portion of 3rd and 4th metatarsal on left foot. 7/10 pain with walking describes as aching sensation. Has not tried any ibuprofen or aleve yet, ice helped some. Slight pain in left knee and hip as thinks she is walking differently A/P: send for x-ray. Rx for post op shoe given in  case fracture but hopeful just bone bruise/contusion. With type of injury- fracture would not surprise me. Discussed if fracture 4-6 weeks to heal, may be that long with contusion as well  Return precautions advised.   Orders Placed This Encounter  Procedures  . DG Foot Complete Left    Standing Status: Future     Number of Occurrences:      Standing Expiration Date: 11/11/2016    Order Specific Question:  Reason for Exam (SYMPTOM  OR DIAGNOSIS REQUIRED)    Answer:  left foot pain, corner of bookshelf fell at distal portion of 3rd and 4th metatarsals, rule out fracture    Order Specific Question:  Is the patient pregnant?    Answer:  No     Order Specific Question:  Preferred imaging location?    Answer:  Judithann Sauger, MD

## 2015-09-12 NOTE — Patient Instructions (Addendum)
I am concerned you have fractured one or two of the bones of your foot (hopeful for just bone bruise though), need x-ray to evaluate this.   Please get this today  If there is a fracture 4-6 weeks to heal, relative rest, wear post op shoe as much as possible even around the house

## 2015-09-16 ENCOUNTER — Ambulatory Visit (INDEPENDENT_AMBULATORY_CARE_PROVIDER_SITE_OTHER): Payer: Commercial Managed Care - PPO | Admitting: Internal Medicine

## 2015-09-16 ENCOUNTER — Other Ambulatory Visit (INDEPENDENT_AMBULATORY_CARE_PROVIDER_SITE_OTHER): Payer: Commercial Managed Care - PPO | Admitting: *Deleted

## 2015-09-16 ENCOUNTER — Encounter: Payer: Self-pay | Admitting: Internal Medicine

## 2015-09-16 VITALS — BP 114/62 | HR 85 | Temp 97.9°F | Resp 12 | Wt 170.0 lb

## 2015-09-16 DIAGNOSIS — E1165 Type 2 diabetes mellitus with hyperglycemia: Secondary | ICD-10-CM

## 2015-09-16 DIAGNOSIS — IMO0001 Reserved for inherently not codable concepts without codable children: Secondary | ICD-10-CM

## 2015-09-16 DIAGNOSIS — E119 Type 2 diabetes mellitus without complications: Secondary | ICD-10-CM

## 2015-09-16 DIAGNOSIS — Z794 Long term (current) use of insulin: Secondary | ICD-10-CM | POA: Diagnosis not present

## 2015-09-16 LAB — POCT GLYCOSYLATED HEMOGLOBIN (HGB A1C): Hemoglobin A1C: 7.2

## 2015-09-16 MED ORDER — GLIPIZIDE ER 5 MG PO TB24: ORAL_TABLET | ORAL | Status: DC

## 2015-09-16 MED ORDER — ALBIGLUTIDE 30 MG ~~LOC~~ PEN: PEN_INJECTOR | SUBCUTANEOUS | Status: DC

## 2015-09-16 NOTE — Patient Instructions (Signed)
Please continue:  - Lantus 25 units at bedtime. - Tanzeum 30 mg weekly - Metformin 1000 mg 2x a day  Please increase: - Glipizide XL to 10 mg in am and 5 mg before dinner (cut it in half first)  Please return in 3 months with your sugar log.

## 2015-09-16 NOTE — Progress Notes (Signed)
Subjective:     Patient ID: Denise Beard, female   DOB: Jul 03, 1960, 55 y.o.   MRN: [id number]  Diabetes   Ms. Murrell is a pleasant 55 y.o. woman, returning for f/u for DM2, dx 2012, insulin-dependent, uncontrolled, without complications. Last visit 3 mo ago.  Her last hemoglobin A1c: Lab Results  Component Value Date   HGBA1C 7.0 04/22/2015   HGBA1C 7.0 01/14/2015   HGBA1C 7.6* 10/15/2014  Previously 7.9% (02/18/2012).   We started Tanzeum at last visit, replacing Januvia. We also increased Lantus a little. She did develop some dizziness and sugars in the 90s 07/2015 and at that point, we decreased glipizide.  She is on:  - Tanzeum 30 mg weekly - Metformin 1000 mg 2x a day - Glipizide XL 10 >> 5 mg in am and 2.5 mg with dinner - Lantus 25 units at bedtime. She was on saxagliptin/metformin XR 09/998 mg (Kombyglyze) in the past. She started on on JanuMet 50/1000 bid on 07/17/2013 >> lightheaded, HA, blurry vision.  She checks 2-3x a week - brings meter: - am: 109-165, 214 >> 144-174, 207, 236 >> 144-203 >> 112, 123, 145-218, 231 >> 113, 146-218, 225 >> 108, 118-221,  - 2h after b'fast: 206-375 >> n/c >> 179, 201 >> n/c >> 177-294 >> 203 - before lunch: 177, 191 >> 153 >> n/c >> 161 >> 92-143 >> n/c>> 134, 160, 243 >> 212 >>  - 2h after lunch: 135 >> 248 >> 169, 257 >> 134-173-241 >> n/c >> 178 >> 141-254 >> 162, 238 - before dinner: 130, 153, 169 >> 113 >> 244 >> n/c >> 156-225 >> n/c >> 141-278 >> 137-143, 255 >> 146, 201 - 2h after dinner: 124, 190-274 >> 160-220, 319 >> 155-279, 319 >> 164-240, 350 >> 182-304 >> 105, 149-271 - bedtime: 130-140 >> 152, 164, 233 >> 174 >> 217 >> n/c>> 129-242 >> see above >> 200-294 >> 187-270, 340 Lowest: 90s Strips are expensive. Meter: One Touch Ultra 2  She had nutrition classes with Db education (nutrition) in the past.   - Last eye exam 09/07/2013. No DR. (Dr Macarthur Crit[facility]ley [facility]) - no CKD; no neuropathy.  Lab Results   Component Value Date   BUN 8 11/04/2014   CREATININE 0.68 11/04/2014  ACR was 1.9 on 10/06/2012. - Lipids:  Lab Results  Component Value Date   CHOL 139 11/04/2014   HDL 38.40* 11/04/2014   LDLCALC 83 11/04/2014   LDLDIRECT 179.6 07/17/2013   TRIG 90.0 11/04/2014   CHOLHDL 4 11/04/2014  She is on Zocor.  She also has a history of anxiety; asthma; GERD; constipation; polyarthropathy; seizure disorder.   Review of Systems Constitutional: no weight gain/loss, no fatigue, no subjective hyperthermia/hypothermia Eyes: no blurry vision, no xerophthalmia ENT: no sore throat, no nodules palpated in throat, no dysphagia/odynophagia, no hoarseness Cardiovascular: no CP/SOB/palpitations/leg swelling Respiratory: no cough/SOB/wheezing Gastrointestinal: no N/V/D/C Musculoskeletal: no muscle/joint aches Skin: no rashes, no hair loss Neurological: no tremors/numbness/tingling/dizziness  I reviewed pt's medications, allergies, PMH, social hx, family hx, and changes were documented in the history of present illness. Otherwise, unchanged from my initial visit note.  Objective:   Physical Exam  BP 114/62 mmHg  Pulse 85  Temp(Src) 97.9 F (36.6 C) (Oral)  Resp 12  Wt 170 lb (77.111 kg)  SpO2 97% Body mass index is 28.29 kg/(m^2).  Wt Readings from Last 3 Encounters:  09/16/15 170 lb (77.111 kg)  09/12/15 170 lb (77.111 kg)  06/17/15  172 lb 12.8 oz (78.382 kg)  Constitutional: slightly overweight, in NAD Eyes: PERRLA, EOMI, no exophthalmos ENT: moist mucous membranes, no thyromegaly, no cervical lymphadenopathy Cardiovascular: RRR, No MRG Respiratory: CTA B Gastrointestinal: abdomen soft, NT, ND, BS+ Skin: moist, warm Neuro: DTR normal in all 4  Assessment:     1. DM2, insulin-dependent, uncontrolled, without complications    Plan:     Pt with still high sugars despite increasing her Lantus and adding Tanzeum. We checked her hemoglobin A1c today >> 7.2% slightly higher.  Sugars  are variable, but mostly higher after meals >> will increase back Glipizide, including before dinner. We cannot increase Tanzeum for now 2/2 nausea in the fisrt 2 days after taking it. However, overall, she feels better and is happy with the regimen. - I advised her to: Patient Instructions  Please continue:  - Lantus 25 units at bedtime. - Tanzeum 30 mg weekly - Metformin 1000 mg 2x a day  Please increase: - Glipizide XL to 10 mg in am and 5 mg before dinner (cut it in half first)  Please return in 3 months with your sugar log.  - She needs another eye exam >> she scheduled it >> next month - She is up-to-date with her flu shot - Return in about 3 months (around 12/16/2015).

## 2015-09-30 LAB — HM DIABETES EYE EXAM

## 2015-10-06 ENCOUNTER — Other Ambulatory Visit: Payer: Self-pay | Admitting: Internal Medicine

## 2015-10-24 ENCOUNTER — Ambulatory Visit (INDEPENDENT_AMBULATORY_CARE_PROVIDER_SITE_OTHER): Payer: Commercial Managed Care - PPO | Admitting: Family Medicine

## 2015-10-24 ENCOUNTER — Encounter: Payer: Self-pay | Admitting: *Deleted

## 2015-10-24 ENCOUNTER — Encounter: Payer: Self-pay | Admitting: Family Medicine

## 2015-10-24 VITALS — BP 140/90 | HR 80 | Temp 98.0°F | Ht 60.0 in | Wt 169.0 lb

## 2015-10-24 DIAGNOSIS — J0100 Acute maxillary sinusitis, unspecified: Secondary | ICD-10-CM | POA: Diagnosis not present

## 2015-10-24 MED ORDER — AMOXICILLIN-POT CLAVULANATE 875-125 MG PO TABS: 1.0000 | ORAL_TABLET | Freq: Two times a day (BID) | ORAL | Status: DC

## 2015-10-24 NOTE — Progress Notes (Signed)
PCP: [name], MD  Subjective:  Denise Beard is a 55 y.o. year old very pleasant female patient who presents with sinusitis symptoms including nasal congestion, moderate to severe sinus tenderness -other symptoms include: mild sore throat, minimal cough, dental pain especially with chewing -day of illness:3 -started: early saturday -Symptoms are worsening -previous treatments: Flonase. Saline rinses. Washes out clear drainage -sick contacts/travel/risks: denies flu exposure or other sick contacts  ROS-denies fever measured but has had subjective fevers, SOB, NV.   Pertinent Past Medical History-  Patient Active Problem List   Diagnosis Date Noted  . Type 2 diabetes mellitus, uncontrolled ([place]) 04/06/2009    Priority: High  . Hyperlipidemia 02/04/2015    Priority: Medium  . Insomnia 02/04/2015    Priority: Medium  . Asthma 06/26/2007    Priority: Medium  . Osteoarthritis 02/04/2015    Priority: Low  . Vasovagal syncope 02/04/2015    Priority: Low  . Allergic rhinitis 02/04/2015    Priority: Low  . Irritable bowel syndrome 02/27/2010    Priority: Low  . GERD 06/26/2007    Priority: Low  . Controlled type 2 diabetes mellitus without complication, with long-term current use of insulin ([place]) 06/17/2015    Medications- reviewed  Current Outpatient Prescriptions  Medication Sig Dispense Refill  . Albiglutide (TANZEUM) 30 MG PEN Inject 30 mg once a week 4 each 5  . budesonide-formoterol (SYMBICORT) 80-4.5 MCG/ACT inhaler Inhale 2 puffs into the lungs 2 (two) times daily.      Marland Kitchen glipiZIDE (GLUCOTROL XL) 5 MG 24 hr tablet Take 10 mg in the AM and 5 mg before dinner. 90 tablet 2  . Insulin Glargine (LANTUS SOLOSTAR) 100 UNIT/ML Solostar Pen Inject 25 Units into the skin daily at 10 pm. 5 pen 2  . Insulin Pen Needle (CAREFINE PEN NEEDLES) 32G X 4 MM MISC Use 1x a day 100 each 2  . metFORMIN (GLUCOPHAGE) 1000 MG tablet TAKE 1 TABLET (1,000 MG TOTAL) BY MOUTH 2 (TWO) TIMES  DAILY WITH A MEAL. 60 tablet 2  . simvastatin (ZOCOR) 20 MG tablet TAKE 1 TABLET (20 MG TOTAL) BY MOUTH AT BEDTIME. 90 tablet 3  . zolpidem (AMBIEN) 10 MG tablet TAKE 1 TABLET BY MOUTH AT BEDTIME AS NEEDED FOR SLEEP 30 tablet 5   No current facility-administered medications for this visit.    Objective: BP 140/90 mmHg  Pulse 80  Temp(Src) 98 F (36.7 C) (Oral)  Ht 5' (1.524 m)  Wt 169 lb (76.658 kg)  BMI 33.01 kg/m2 Gen: NAD, resting comfortably HEENT: Turbinates erythematous with clear drainage, TM normal, pharynx mildly erythematous with no tonsilar exudate or edema, maxillary sinus tenderness noted CV: RRR no murmurs rubs or gallops Lungs: CTAB no crackles, wheeze, rhonchi Ext: no edema Skin: warm, dry, no rash Neuro: grossly normal, moves all extremities  Assessment/Plan:  Sinsusitis Viral based on <10 days, no double sickening, lack of severity of symptoms in first 3 days (including fever). We also discussed reasons why current illness does not meet criteria for bacterial illness and therefore no antibiotics indicated. Also educated on signs that bacterial infection may have developed.   Treatment: -considered steroid: opted out to avoid raising sugars further -other symptomatic care with: Schedule tylenol 650mg  every 6- 8 hours for sinus pain, mucinex twice a day -Antibiotic indicated: no. I went ahead and wrote a prescription given her severity of pain. If she has fevers, worsening symptoms past Wednesday, symptoms not at least 50% improved by next Wednesday, she  will go ahead and take antibiotic.   Finally, we reviewed reasons to return to care including if symptoms worsen or persist despite treatmentor new concerns arise.  Bp noted slightly high. Has been controlled in past- will monitor. Avoid decongestants  Meds ordered this encounter  Medications  . amoxicillin-clavulanate (AUGMENTIN) 875-125 MG tablet    Sig: Take 1 tablet by mouth 2 (two) times daily.     Dispense:  14 tablet    Refill:  0  higher risk patient with asthma- advised patient to monitor respiratory status closely- if this worsens she can also take the antibiotic  [name], MD

## 2015-10-24 NOTE — Patient Instructions (Addendum)
Sinsusitis Viral based on <10 days, no double sickening, lack of severity of symptoms in first 3 days (including fever). We also discussed reasons why current illness does not meet criteria for bacterial illness and therefore no antibiotics indicated. Also educated on signs that bacterial infection may have developed.   Treatment: -considered steroid: opted out to avoid raising sugars further -other symptomatic care with: Schedule tylenol 650mg  every 6- 8 hours for sinus pain, mucinex twice a day -Antibiotic indicated: no. I went ahead and wrote a prescription given her severity of pain. If she has fevers, worsening symptoms past Wednesday, symptoms not at least 50% improved by next Wednesday, she will go ahead and take antibiotic.   Finally, we reviewed reasons to return to care including if symptoms worsen or persist despite treatmentor new concerns arise.  Blood pressure noted slightly high. Has been controlled in past- will monitor. Avoid decongestants  Meds ordered this encounter  Medications  . amoxicillin-clavulanate (AUGMENTIN) 875-125 MG tablet    Sig: Take 1 tablet by mouth 2 (two) times daily.    Dispense:  14 tablet    Refill:  0  higher risk patient with asthma- advised patient to monitor respiratory status closely- if this worsens she can also take the antibiotic

## 2015-11-03 ENCOUNTER — Other Ambulatory Visit: Payer: Self-pay | Admitting: Internal Medicine

## 2015-12-01 ENCOUNTER — Other Ambulatory Visit: Payer: Self-pay | Admitting: Family

## 2015-12-16 ENCOUNTER — Encounter: Payer: Self-pay | Admitting: Internal Medicine

## 2015-12-16 ENCOUNTER — Ambulatory Visit (INDEPENDENT_AMBULATORY_CARE_PROVIDER_SITE_OTHER): Payer: Commercial Managed Care - PPO | Admitting: Internal Medicine

## 2015-12-16 VITALS — BP 124/90 | HR 66 | Temp 98.0°F | Wt 164.0 lb

## 2015-12-16 DIAGNOSIS — E119 Type 2 diabetes mellitus without complications: Secondary | ICD-10-CM | POA: Diagnosis not present

## 2015-12-16 LAB — POCT GLYCOSYLATED HEMOGLOBIN (HGB A1C): Hemoglobin A1C: 6.9

## 2015-12-16 MED ORDER — ALBIGLUTIDE 30 MG ~~LOC~~ PEN: PEN_INJECTOR | SUBCUTANEOUS | 5 refills | Status: DC

## 2015-12-16 MED ORDER — GLIPIZIDE ER 5 MG PO TB24: ORAL_TABLET | ORAL | 3 refills | Status: DC

## 2015-12-16 MED ORDER — INSULIN GLARGINE 100 UNIT/ML SOLOSTAR PEN: 25.0000 [IU] | PEN_INJECTOR | Freq: Every day | SUBCUTANEOUS | 4 refills | Status: DC

## 2015-12-16 MED ORDER — METFORMIN HCL 1000 MG PO TABS: ORAL_TABLET | ORAL | 3 refills | Status: DC

## 2015-12-16 NOTE — Patient Instructions (Addendum)
Please continue:  - Lantus 25 units at bedtime. - Tanzeum 30 mg weekly - Metformin 1000 mg 2x a day  Please increase: - Glipizide XL to 10 mg in am and 10 mg before dinner (crushed)  Please return in 3 months with your sugar log.

## 2015-12-16 NOTE — Progress Notes (Signed)
Pre visit review using our clinic review tool, if applicable. No additional management support is needed unless otherwise documented below in the visit note. 

## 2015-12-16 NOTE — Progress Notes (Signed)
Subjective:     Patient ID: Denise Beard, female   DOB: April 01, 1961, 55 y.o.   MRN: [id number]  Diabetes    Denise Beard is a pleasant 55 y.o. woman, returning for f/u for DM2, dx 2012, insulin-dependent, uncontrolled, without complications. Last visit 3 mo ago.  Her last hemoglobin A1c: Lab Results  Component Value Date   HGBA1C 7.2 09/16/2015   HGBA1C 7.0 04/22/2015   HGBA1C 7.0 01/14/2015  Previously 7.9% (02/18/2012).   We started Tanzeum at last visit, replacing Januvia. We also increased Lantus a little. She did develop some dizziness and sugars in the 90s 07/2015 and at that point, we decreased glipizide.  She is on:   - Lantus 25 units at bedtime. - Tanzeum 30 mg weekly - Metformin 1000 mg 2x a day - Glipizide XL 10 mg in am and 5 mg before dinner (cut it in half first)  Please return in 3 months with your sugar log.She was on saxagliptin/metformin XR 09/998 mg (Kombyglyze) in the past. She started on on JanuMet 50/1000 bid on 07/17/2013 >> lightheaded, HA, blurry vision.  She checks 2-3x a week - per meter download: - am: 144-203 >> 112, 123, 145-218, 231 >> 113, 146-218, 225 >> 108, 118-221 >> 113-201, 224 - 2h after b'fast: 206-375 >> n/c >> 179, 201 >> n/c >> 177-294 >> 203 >> n/c - before lunch: 177, 191 >> 153 >> n/c >> 161 >> 92-143 >> n/c>> 134, 160, 243 >> 212 >> 123, 134, 201  - 2h after lunch: 135 >> 248 >> 169, 257 >> 134-173-241 >> n/c >> 178 >> 141-254 >> 162, 238 >> 74-136, 158 - before dinner: 130, 153, 169 >> 113 >> 244 >> n/c >> 156-225 >> n/c >> 141-278 >> 137-143, 255 >> 146, 201 >> 68x1 66 - 2h after dinner: 124, 190-274 >> 160-220, 319 >> 155-279, 319 >> 164-240, 350 >> 182-304 >> 105, 149-271 >> 74-136 - bedtime: 130-140 >> 152, 164, 233 >> 174 >> 217 >> n/c>> 129-242 >> see above >> 200-294 >> 187-270, 340 >> 101-229 Lowest: 90s >> 68x1 Strips are expensive. Meter: One Touch Ultra 2  She had nutrition classes with Db education (nutrition) in the  past.   - Last eye exam 09/30/2015. No DR. (Dr Macarthur Critchley [facility] - no CKD; no neuropathy.  Lab Results  Component Value Date   BUN 8 11/04/2014   CREATININE 0.68 11/04/2014  ACR was 1.9 on 10/06/2012. - Lipids:  Lab Results  Component Value Date   CHOL 139 11/04/2014   HDL 38.40 (L) 11/04/2014   LDLCALC 83 11/04/2014   LDLDIRECT 179.6 07/17/2013   TRIG 90.0 11/04/2014   CHOLHDL 4 11/04/2014  She is on Zocor.  She also has a history of anxiety; asthma; GERD; constipation; polyarthropathy; seizure disorder.   Review of Systems Constitutional: + weight loss, no fatigue, no subjective hyperthermia/hypothermia Eyes: no blurry vision, no xerophthalmia ENT: no sore throat, no nodules palpated in throat, no dysphagia/odynophagia, no hoarseness Cardiovascular: no CP/SOB/palpitations/leg swelling Respiratory: no cough/SOB/wheezing Gastrointestinal: no N/V/D/C Musculoskeletal: no muscle/joint aches Skin: no rashes, no hair loss Neurological: no tremors/numbness/tingling/dizziness  I reviewed pt's medications, allergies, PMH, social hx, family hx, and changes were documented in the history of present illness. Otherwise, unchanged from my initial visit note.  Objective:   Physical Exam  BP 124/90   Pulse 66   Temp 98 F (36.7 C) (Oral)   Wt 164 lb (74.4 kg)   SpO2  99%   BMI 32.03 kg/m  Body mass index is 32.03 kg/m.  Wt Readings from Last 3 Encounters:  12/16/15 164 lb (74.4 kg)  10/24/15 169 lb (76.7 kg)  09/16/15 170 lb (77.1 kg)  Constitutional: slightly overweight, in NAD Eyes: PERRLA, EOMI, no exophthalmos ENT: moist mucous membranes, no thyromegaly, no cervical lymphadenopathy Cardiovascular: RRR, No MRG Respiratory: CTA B Gastrointestinal: abdomen soft, NT, ND, BS+ Skin: moist, warm Neuro: DTR normal in all 4  Assessment:     1. DM2, insulin-dependent, uncontrolled, without complications    Plan:     Pt with improving sugars and a  hemoglobin A1c today >> 6.9% (better).  Sugars are still variable, though, mostly higher after dinner and in am >> will increase dinnertime. She preferred this to increasing Tanzeum 2/2 nausea in the first 2 days after taking it. However, overall, she feels better and is happy with the regimen. - I advised her to: Patient Instructions  Please continue:  - Lantus 25 units at bedtime. - Tanzeum 30 mg weekly - Metformin 1000 mg 2x a day  Please increase: - Glipizide XL to 10 mg in am and 10 mg before dinner (crushed)  Please return in 3 months with your sugar log.  - She is UTD with eye exams  - RTC in 3 mo - will need a CMP and Lipid panel then

## 2016-01-11 ENCOUNTER — Other Ambulatory Visit: Payer: Self-pay | Admitting: Internal Medicine

## 2016-01-30 ENCOUNTER — Other Ambulatory Visit: Payer: Self-pay

## 2016-01-30 ENCOUNTER — Telehealth: Payer: Self-pay | Admitting: Family Medicine

## 2016-01-30 MED ORDER — ZOLPIDEM TARTRATE 10 MG PO TABS: 10.0000 mg | ORAL_TABLET | Freq: Every evening | ORAL | 5 refills | Status: DC | PRN

## 2016-01-30 NOTE — Telephone Encounter (Signed)
Prescription faxed to pharmacy as requested. 

## 2016-01-30 NOTE — Telephone Encounter (Signed)
Pt need new Rx for Ambien    Pharm:  [facility]

## 2016-02-03 ENCOUNTER — Encounter: Payer: Self-pay | Admitting: Family Medicine

## 2016-03-14 ENCOUNTER — Other Ambulatory Visit: Payer: Self-pay | Admitting: Family Medicine

## 2016-03-27 ENCOUNTER — Other Ambulatory Visit: Payer: Self-pay | Admitting: Internal Medicine

## 2016-04-06 ENCOUNTER — Other Ambulatory Visit (INDEPENDENT_AMBULATORY_CARE_PROVIDER_SITE_OTHER): Payer: Commercial Managed Care - PPO

## 2016-04-06 DIAGNOSIS — Z0000 Encounter for general adult medical examination without abnormal findings: Secondary | ICD-10-CM

## 2016-04-06 LAB — CBC WITH DIFFERENTIAL/PLATELET
BASOS PCT: 0.6 % (ref 0.0–3.0)
Basophils Absolute: 0.1 10*3/uL (ref 0.0–0.1)
Eosinophils Absolute: 0.2 10*3/uL (ref 0.0–0.7)
Eosinophils Relative: 2.6 % (ref 0.0–5.0)
HEMATOCRIT: 36.6 % (ref 36.0–46.0)
Hemoglobin: 12 g/dL (ref 12.0–15.0)
LYMPHS ABS: 3.4 10*3/uL (ref 0.7–4.0)
LYMPHS PCT: 41.2 % (ref 12.0–46.0)
MCHC: 32.8 g/dL (ref 30.0–36.0)
MCV: 89.6 fl (ref 78.0–100.0)
MONOS PCT: 7.8 % (ref 3.0–12.0)
Monocytes Absolute: 0.7 10*3/uL (ref 0.1–1.0)
NEUTROS ABS: 4 10*3/uL (ref 1.4–7.7)
NEUTROS PCT: 47.8 % (ref 43.0–77.0)
PLATELETS: 324 10*3/uL (ref 150.0–400.0)
RBC: 4.08 Mil/uL (ref 3.87–5.11)
RDW: 13.3 % (ref 11.5–15.5)
WBC: 8.3 10*3/uL (ref 4.0–10.5)

## 2016-04-06 LAB — BASIC METABOLIC PANEL
BUN: 8 mg/dL (ref 6–23)
CHLORIDE: 106 meq/L (ref 96–112)
CO2: 27 meq/L (ref 19–32)
Calcium: 9.3 mg/dL (ref 8.4–10.5)
Creatinine, Ser: 0.65 mg/dL (ref 0.40–1.20)
GFR: 122.23 mL/min (ref >60.00)
GLUCOSE: 146 mg/dL — AB (ref 70–99)
Potassium: 3.9 mEq/L (ref 3.5–5.1)
SODIUM: 141 meq/L (ref 135–145)

## 2016-04-06 LAB — LIPID PANEL
CHOLESTEROL: 141 mg/dL (ref 0–200)
HDL: 41.8 mg/dL (ref >39.00)
LDL Cholesterol: 84 mg/dL (ref 0–99)
NonHDL: 99.63
TRIGLYCERIDES: 76 mg/dL (ref 0.0–149.0)
Total CHOL/HDL Ratio: 3
VLDL: 15.2 mg/dL (ref 0.0–40.0)

## 2016-04-06 LAB — POC URINALSYSI DIPSTICK (AUTOMATED)
Bilirubin, UA: NEGATIVE
Blood, UA: NEGATIVE
GLUCOSE UA: NEGATIVE
Ketones, UA: NEGATIVE
NITRITE UA: NEGATIVE
PROTEIN UA: NEGATIVE
SPEC GRAV UA: 1.015
UROBILINOGEN UA: 0.2
pH, UA: 5

## 2016-04-06 LAB — HEMOGLOBIN A1C: Hgb A1c MFr Bld: 7.3 % — ABNORMAL HIGH (ref 4.6–6.5)

## 2016-04-06 LAB — MICROALBUMIN / CREATININE URINE RATIO
CREATININE, U: 108.1 mg/dL
MICROALB UR: 0.9 mg/dL (ref 0.0–1.9)
Microalb Creat Ratio: 0.8 mg/g (ref 0.0–30.0)

## 2016-04-06 LAB — TSH: TSH: 1.42 u[IU]/mL (ref 0.35–4.50)

## 2016-04-06 LAB — HEPATIC FUNCTION PANEL
ALBUMIN: 3.9 g/dL (ref 3.5–5.2)
ALT: 12 U/L (ref 0–35)
AST: 12 U/L (ref 0–37)
Alkaline Phosphatase: 90 U/L (ref 39–117)
BILIRUBIN DIRECT: 0 mg/dL (ref 0.0–0.3)
TOTAL PROTEIN: 7.1 g/dL (ref 6.0–8.3)
Total Bilirubin: 0.3 mg/dL (ref 0.2–1.2)

## 2016-04-10 ENCOUNTER — Encounter: Payer: Self-pay | Admitting: Family Medicine

## 2016-04-10 ENCOUNTER — Ambulatory Visit (INDEPENDENT_AMBULATORY_CARE_PROVIDER_SITE_OTHER): Payer: Commercial Managed Care - PPO | Admitting: Family Medicine

## 2016-04-10 VITALS — BP 112/86 | HR 85 | Temp 98.2°F | Ht 66.5 in | Wt 165.6 lb

## 2016-04-10 DIAGNOSIS — E119 Type 2 diabetes mellitus without complications: Secondary | ICD-10-CM | POA: Diagnosis not present

## 2016-04-10 DIAGNOSIS — R300 Dysuria: Secondary | ICD-10-CM

## 2016-04-10 DIAGNOSIS — R8290 Unspecified abnormal findings in urine: Secondary | ICD-10-CM | POA: Diagnosis not present

## 2016-04-10 DIAGNOSIS — Z0001 Encounter for general adult medical examination with abnormal findings: Secondary | ICD-10-CM

## 2016-04-10 LAB — POC URINALSYSI DIPSTICK (AUTOMATED)
BILIRUBIN UA: NEGATIVE
Blood, UA: NEGATIVE
GLUCOSE UA: NEGATIVE
KETONES UA: NEGATIVE
Nitrite, UA: NEGATIVE
PROTEIN UA: NEGATIVE
SPEC GRAV UA: 1.025
Urobilinogen, UA: 0.2
pH, UA: 5

## 2016-04-10 MED ORDER — BUDESONIDE-FORMOTEROL FUMARATE 80-4.5 MCG/ACT IN AERO: 2.0000 | INHALATION_SPRAY | Freq: Two times a day (BID) | RESPIRATORY_TRACT | 11 refills | Status: DC

## 2016-04-10 MED ORDER — ALBUTEROL SULFATE HFA 108 (90 BASE) MCG/ACT IN AERS: 2.0000 | INHALATION_SPRAY | Freq: Four times a day (QID) | RESPIRATORY_TRACT | 2 refills | Status: DC | PRN

## 2016-04-10 NOTE — Addendum Note (Signed)
Addended by: Gari Crown D on: 04/10/2016 02:40 PM   Modules accepted: Orders

## 2016-04-10 NOTE — Progress Notes (Signed)
Pre visit review using our clinic review tool, if applicable. No additional management support is needed unless otherwise documented below in the visit note. 

## 2016-04-10 NOTE — Patient Instructions (Addendum)
Drop off urine before you leave  Sign release of information at the check out desk for last pap smear and mammogram from gynecology  Clean the diet up. Restart exercise even if just start with walking 10 minutes a day.   Refilled symbicort and gave you albuterol as needed as rescue inhaler.   Next time you get muscle/joint aches- try off statin for a week to see if that helps.

## 2016-04-10 NOTE — Progress Notes (Signed)
Phone: [phone]  Subjective:  Patient presents today for their annual physical. Chief complaint-noted.   See problem oriented charting- ROS- full  review of systems was completed and negative except for: muscle/joint aches diffuse at times  The following were reviewed and entered/updated in epic: Past Medical History:  Diagnosis Date  . ANXIETY 03/17/2007   in past  . ASTHMA 06/26/2007  . DIAB W/O COMP TYPE II/UNS NOT STATED UNCNTRL 04/06/2009  . GERD 06/26/2007  . Irritable bowel syndrome 02/27/2010   constipation  . OA (osteoarthritis) 12/13/2009  . SEIZURE DISORDER 04/17/2010   in past /over 4 years ago/no meds. actually vasovagal per neuro notes   Patient Active Problem List   Diagnosis Date Noted  . Type II diabetes mellitus, well controlled ([place]) 04/06/2009    Priority: High  . Hyperlipidemia 02/04/2015    Priority: Medium  . Insomnia 02/04/2015    Priority: Medium  . Asthma 06/26/2007    Priority: Medium  . Osteoarthritis 02/04/2015    Priority: Low  . Vasovagal syncope 02/04/2015    Priority: Low  . Allergic rhinitis 02/04/2015    Priority: Low  . Irritable bowel syndrome 02/27/2010    Priority: Low  . GERD 06/26/2007    Priority: Low   Past Surgical History:  Procedure Laterality Date  . ABDOMINAL HYSTERECTOMY    . CESAREAN SECTION    . TUBAL LIGATION      Family History  Problem Relation Age of Onset  . Thyroid disease Mother   . Hyperlipidemia Mother   . Breast cancer Mother 44  . Cancer Maternal Grandfather     unknown  . Colon cancer Maternal Uncle   . Stomach cancer Maternal Uncle   . Lung cancer Maternal Uncle   . Other Father     doesnt tell her what issues he has    Medications- reviewed and updated Current Outpatient Prescriptions  Medication Sig Dispense Refill  . Albiglutide (TANZEUM) 30 MG PEN Inject 30 mg once a week 4 each 5  . budesonide-formoterol (SYMBICORT) 80-4.5 MCG/ACT inhaler Inhale 2 puffs into the lungs 2 (two)  times daily. 1 Inhaler 11  . glipiZIDE (GLUCOTROL XL) 5 MG 24 hr tablet Take 10 mg in the AM and 10 mg before dinner. 360 tablet 3  . Insulin Glargine (LANTUS SOLOSTAR) 100 UNIT/ML Solostar Pen Inject 25 Units into the skin daily at 10 pm. 5 pen 4  . Insulin Pen Needle (CAREFINE PEN NEEDLES) 32G X 4 MM MISC Use 1x a day 100 each 2  . metFORMIN (GLUCOPHAGE) 1000 MG tablet TAKE 1 TABLET (1,000 MG TOTAL) BY MOUTH 2 (TWO) TIMES DAILY WITH A MEAL. 180 tablet 3  . simvastatin (ZOCOR) 20 MG tablet TAKE 1 TABLET BY MOUTH AT BEDTIME 90 tablet 2  . zolpidem (AMBIEN) 10 MG tablet Take 1 tablet (10 mg total) by mouth at bedtime as needed. for sleep 30 tablet 5  . albuterol (PROVENTIL HFA;VENTOLIN HFA) 108 (90 Base) MCG/ACT inhaler Inhale 2 puffs into the lungs every 6 (six) hours as needed for wheezing or shortness of breath. 1 Inhaler 2   No current facility-administered medications for this visit.     Allergies-reviewed and updated Allergies  Allergen Reactions  . Iodine     Per the patient. Had an oral solution in the 90's that gave her a bad reaction of hallucinations.     Social History   Social History  . Marital status: Divorced    Spouse name: N/A  .  Number of children: N/A  . Years of education: N/A   Social History Main Topics  . Smoking status: Never Smoker  . Smokeless tobacco: Never Used  . Alcohol use No  . Drug use: No  . Sexual activity: Not Asked   Other Topics Concern  . None   Social History Narrative   Divorced/Single. 2 chldren. 2 grandkids      Works; Nurse, mental health for background checks [place]      [place]: church very active          Objective: BP 112/86 (BP Location: Left Arm, Patient Position: Sitting, Cuff Size: Normal)   Pulse 85   Temp 98.2 F (36.8 C) (Oral)   Ht 5' 6.5" (1.689 m)   Wt 165 lb 9.6 oz (75.1 kg)   SpO2 95%   BMI 26.33 kg/m  Gen: NAD, resting comfortably HEENT: Mucous membranes are moist. Oropharynx  normal, TM normal CV: RRR no murmurs rubs or gallops Lungs: CTAB no crackles, wheeze, rhonchi Abdomen: soft/nontender/nondistended/normal bowel sounds. No rebound or guarding.  Ext: no edema Msk: no obvious joint enlargement or significant pain Skin: warm, dry Neuro: grossly normal, moves all extremities, PERRLA  Diabetic Foot Exam - Simple   Simple Foot Form Diabetic Foot exam was performed with the following findings:  Yes 04/10/2016  2:19 PM  Visual Inspection No deformities, no ulcerations, no other skin breakdown bilaterally:  Yes Sensation Testing Intact to touch and monofilament testing bilaterally:  Yes Pulse Check Posterior Tibialis and Dorsalis pulse intact bilaterally:  Yes Comments    Assessment/Plan:  55 y.o. female presenting for annual physical.  Health Maintenance counseling: 1. Anticipatory guidance: Patient counseled regarding regular dental exams (not doing- advised to do this), eye exams, wearing seatbelts.  2. Risk factor reduction:  Advised patient of need for regular exercise and diet rich and fruits and vegetables to reduce risk of heart attack and stroke. Not exercising- advised to start- eating has been poor.  3. Immunizations/screenings/ancillary studies Immunization History  Administered Date(s) Administered  . Influenza Whole 02/19/2011  . Influenza-Unspecified 02/09/2015, 01/24/2016  . Pneumococcal Conjugate-13 11/10/2014  . Td 09/02/2007   Health Maintenance Due  Topic Date Due  . FOOT EXAM - today normal 07/03/2015   4. Cervical cancer screening- done through GYN this year- get records 5. Breast cancer screening-  breast exam with GYN and mammogram with GYN- getting records- had in 2017 6. Colon cancer screening - 02/17/15 with 10 year follow up  Status of chronic or acute concerns  DM- follows with Dr. Cruzita Lederer- lantus, metformin, glipizide, tanzeum Mandatory overtime really affected the way she was eating- had to grab stuff on the run and  knows she did not eat as well but can turn this around now. Also to restart exercise.  Lab Results  Component Value Date   HGBA1C 7.3 (H) 04/06/2016   Insomnia- doing well on ambien 10mg - aware plan for 5mg  at age 22  HLD- reasonable control with 50% reduction in LDL from 180 to 84 on simvastatin 20mg . Intermittent muscle/joint aches- will trial off statin for 1 week with next flare. At times so bad does not want to get out of bed and can last all day. Asks about fibromyalgia- we opted to trial off statin first. Worse with weather changes.   Asthma- Symbicort regularly uses- also albuterol about once a week. Refilled both  Mild burning with peeing over last month- get urine culture  Perhaps 6 month check in  Orders Placed This Encounter  Procedures  . Urine culture    [facility]   Meds ordered this encounter  Medications  . budesonide-formoterol (SYMBICORT) 80-4.5 MCG/ACT inhaler    Sig: Inhale 2 puffs into the lungs 2 (two) times daily.    Dispense:  1 Inhaler    Refill:  11  . albuterol (PROVENTIL HFA;VENTOLIN HFA) 108 (90 Base) MCG/ACT inhaler    Sig: Inhale 2 puffs into the lungs every 6 (six) hours as needed for wheezing or shortness of breath.    Dispense:  1 Inhaler    Refill:  2    Return precautions advised.   [name], MD

## 2016-04-12 LAB — URINE CULTURE: ORGANISM ID, BACTERIA: NO GROWTH

## 2016-04-22 ENCOUNTER — Ambulatory Visit (HOSPITAL_COMMUNITY)
Admission: EM | Admit: 2016-04-22 | Discharge: 2016-04-22 | Disposition: A | Discharge status: Home / Self Care | Payer: Commercial Managed Care - PPO | Attending: Emergency Medicine | Admitting: Emergency Medicine

## 2016-04-22 ENCOUNTER — Encounter (HOSPITAL_COMMUNITY): Payer: Self-pay | Admitting: Emergency Medicine

## 2016-04-22 DIAGNOSIS — Z9851 Tubal ligation status: Secondary | ICD-10-CM | POA: Insufficient documentation

## 2016-04-22 DIAGNOSIS — N760 Acute vaginitis: Secondary | ICD-10-CM

## 2016-04-22 DIAGNOSIS — R1084 Generalized abdominal pain: Secondary | ICD-10-CM

## 2016-04-22 DIAGNOSIS — K5901 Slow transit constipation: Secondary | ICD-10-CM

## 2016-04-22 DIAGNOSIS — N952 Postmenopausal atrophic vaginitis: Secondary | ICD-10-CM

## 2016-04-22 DIAGNOSIS — R109 Unspecified abdominal pain: Secondary | ICD-10-CM | POA: Diagnosis present

## 2016-04-22 DIAGNOSIS — Z90710 Acquired absence of both cervix and uterus: Secondary | ICD-10-CM | POA: Diagnosis not present

## 2016-04-22 DIAGNOSIS — B9689 Other specified bacterial agents as the cause of diseases classified elsewhere: Secondary | ICD-10-CM

## 2016-04-22 LAB — POCT URINALYSIS DIP (DEVICE)
BILIRUBIN URINE: NEGATIVE
Glucose, UA: NEGATIVE mg/dL
HGB URINE DIPSTICK: NEGATIVE
Ketones, ur: NEGATIVE mg/dL
Leukocytes, UA: NEGATIVE
NITRITE: NEGATIVE
PH: 5.5 (ref 5.0–8.0)
Protein, ur: NEGATIVE mg/dL
Specific Gravity, Urine: 1.025 (ref 1.005–1.030)
UROBILINOGEN UA: 0.2 mg/dL (ref 0.0–1.0)

## 2016-04-22 MED ORDER — METRONIDAZOLE 500 MG PO TABS: 500.0000 mg | ORAL_TABLET | Freq: Two times a day (BID) | ORAL | 0 refills | Status: DC

## 2016-04-22 NOTE — ED Triage Notes (Addendum)
Patient noticed bloody discharge today with wiping.  Patient now has low abdominal cramping.  Noticed today a little burning with urination.  Reports provider noted white blood cells and tested urine for uti-last week-report came back negative Urine collected 11/21-ua and culture, results in computer

## 2016-04-22 NOTE — ED Provider Notes (Signed)
CSN: [id number]     Arrival date & time 04/22/16  1505 History   First MD Initiated Contact with Patient 04/22/16 1622     Chief Complaint  Patient presents with  . Abdominal Pain   (Consider location/radiation/quality/duration/timing/severity/associated sxs/prior Treatment) 55 year old female states that today she went to the bathroom to urinate and while wiping she noticed there was blood tinged material on the toilet paper. She believes it is coming from her vagina. She notes at this point that she had a hysterectomy approximately 15 years ago. She is also having lower pelvic cramping that feels like old uterine cramps from several years ago. She is not having a continuous bloody vaginal discharge.  She saw her PCP a little over week ago for what she thought may be a UTI from pelvic discomfort. Urinalysis was obtained as was a urine culture. No organisms identified and it was no growth in the culture. Urinalysis today is negative.      Past Medical History:  Diagnosis Date  . ANXIETY 03/17/2007   in past  . ASTHMA 06/26/2007  . DIAB W/O COMP TYPE II/UNS NOT STATED UNCNTRL 04/06/2009  . GERD 06/26/2007  . Irritable bowel syndrome 02/27/2010   constipation  . OA (osteoarthritis) 12/13/2009  . SEIZURE DISORDER 04/17/2010   in past /over 4 years ago/no meds. actually vasovagal per neuro notes   Past Surgical History:  Procedure Laterality Date  . ABDOMINAL HYSTERECTOMY    . CESAREAN SECTION    . TUBAL LIGATION     Family History  Problem Relation Age of Onset  . Thyroid disease Mother   . Hyperlipidemia Mother   . Breast cancer Mother 64  . Other Father     doesnt tell her what issues he has  . Colon cancer Maternal Uncle   . Stomach cancer Maternal Uncle   . Lung cancer Maternal Uncle   . Cancer Maternal Grandfather     unknown   Social History  Substance Use Topics  . Smoking status: Never Smoker  . Smokeless tobacco: Never Used  . Alcohol use No   OB History    No  data available     Review of Systems  Constitutional: Negative.  Negative for fever.  HENT: Negative.   Respiratory: Negative.   Cardiovascular: Negative.   Gastrointestinal: Positive for abdominal pain. Negative for blood in stool, nausea and vomiting.       When patient was explaining the location for the abdominal discomfort and cramping she placed her hand in the far left upper quadrant of the abdomen and then brushed her hand across the mid and lower abdomen from left to right.  She states she had a bowel movement today that is not been having regular bowel movements for the past several days.  Genitourinary: Positive for hematuria. Negative for dysuria, vaginal bleeding and vaginal discharge.  Musculoskeletal: Negative.   Skin: Negative for color change and rash.  Neurological: Negative.   Psychiatric/Behavioral: Negative.   All other systems reviewed and are negative.   Allergies  Iodine  Home Medications   Prior to Admission medications   Medication Sig Start Date End Date Taking? Authorizing Provider  Albiglutide (TANZEUM) 30 MG PEN Inject 30 mg once a week 12/16/15   Philemon Kingdom, MD  albuterol (PROVENTIL HFA;VENTOLIN HFA) 108 (90 Base) MCG/ACT inhaler Inhale 2 puffs into the lungs every 6 (six) hours as needed for wheezing or shortness of breath. 04/10/16   Marin Olp, MD  budesonide-formoterol [facility]) 80-4.5  MCG/ACT inhaler Inhale 2 puffs into the lungs 2 (two) times daily. 04/10/16   Marin Olp, MD  glipiZIDE (GLUCOTROL XL) 5 MG 24 hr tablet Take 10 mg in the AM and 10 mg before dinner. 12/16/15   Philemon Kingdom, MD  Insulin Glargine (LANTUS SOLOSTAR) 100 UNIT/ML Solostar Pen Inject 25 Units into the skin daily at 10 pm. 12/16/15   Philemon Kingdom, MD  Insulin Pen Needle (CAREFINE PEN NEEDLES) 32G X 4 MM MISC Use 1x a day 06/17/15   Philemon Kingdom, MD  metFORMIN (GLUCOPHAGE) 1000 MG tablet TAKE 1 TABLET (1,000 MG TOTAL) BY MOUTH 2 (TWO) TIMES DAILY  WITH A MEAL. 12/16/15   Philemon Kingdom, MD  metroNIDAZOLE (FLAGYL) 500 MG tablet Take 1 tablet (500 mg total) by mouth 2 (two) times daily. X 7 days 04/22/16   Janne Napoleon, NP  simvastatin (ZOCOR) 20 MG tablet TAKE 1 TABLET BY MOUTH AT BEDTIME 03/14/16   Eulas Post, MD  zolpidem (AMBIEN) 10 MG tablet Take 1 tablet (10 mg total) by mouth at bedtime as needed. for sleep 01/30/16   Marin Olp, MD   Meds Ordered and Administered this Visit  Medications - No data to display  BP 136/90 (BP Location: Right Arm)   Pulse 90   Temp 98.6 F (37 C) (Oral)   Resp 18   SpO2 97%  No data found.   Physical Exam  Constitutional: She is oriented to person, place, and time. She appears well-developed and well-nourished. No distress.  HENT:  Head: Normocephalic and atraumatic.  Eyes: EOM are normal.  Neck: Normal range of motion. Neck supple.  Cardiovascular: Normal rate, regular rhythm and normal heart sounds.   Pulmonary/Chest: Effort normal and breath sounds normal. No respiratory distress.  Abdominal: Soft.  Bowel sounds hypoactive. Abdomen mildly distended but otherwise soft. Percussion is dull to flat in all areas. Mild generalized abdominal tenderness. Tenderness across the pelvis.  Genitourinary:  Genitourinary Comments: Normal external female genitalia Walls of the vagina are friable and atrophy. Areas of pallor and small areas of injection. There is a scant amount of thin gray discharge associated with a fishy odor. The distal vaginal vault is friable with the use of a cotton swab.  Musculoskeletal: She exhibits no edema.  Neurological: She is alert and oriented to person, place, and time. She exhibits normal muscle tone.  Skin: Skin is warm and dry.  Psychiatric: She has a normal mood and affect.  Nursing note and vitals reviewed.   Urgent Care Course   Clinical Course     Procedures (including critical care time)  Labs Review Labs Reviewed  POCT URINALYSIS DIP  (DEVICE)  CERVICOVAGINAL ANCILLARY ONLY   Results for orders placed or performed during the hospital encounter of 04/22/16  POCT urinalysis dip (device)  Result Value Ref Range   Glucose, UA NEGATIVE NEGATIVE mg/dL   Bilirubin Urine NEGATIVE NEGATIVE   Ketones, ur NEGATIVE NEGATIVE mg/dL   Specific Gravity, Urine 1.025 1.005 - 1.030   Hgb urine dipstick NEGATIVE NEGATIVE   pH 5.5 5.0 - 8.0   Protein, ur NEGATIVE NEGATIVE mg/dL   Urobilinogen, UA 0.2 0.0 - 1.0 mg/dL   Nitrite NEGATIVE NEGATIVE   Leukocytes, UA NEGATIVE NEGATIVE      Imaging Review No results found.   Visual Acuity Review  Right Eye Distance:   Left Eye Distance:   Bilateral Distance:    Right Eye Near:   Left Eye Near:    Bilateral Near:  MDM   1. Slow transit constipation   2. Generalized abdominal pain   3. BV (bacterial vaginosis)   4. Vaginal atrophy    Obtain the generic form of MiraLAX and consumed 3 glasses, 1 capful powder in each class this evening. If he do not have good results overnight repeat the same regimen tomorrow. Increase fiber in your diet. Take your medicine for what appears to be bacterial vaginosis and follow-up with your primary care doctor for discussion of atrophic vaginitis and possible hormonal treatment. There is a small amount of bleeding within the vagina along the vaginal walls likely due to to a mild infection and the atrophic vaginitis. No other lesions or abnormalities are seen that are especially worrisome. Meds ordered this encounter  Medications  . metroNIDAZOLE (FLAGYL) 500 MG tablet    Sig: Take 1 tablet (500 mg total) by mouth 2 (two) times daily. X 7 days    Dispense:  14 tablet    Refill:  0    Order Specific Question:   Supervising Provider    Answer:   Carmela Hurt       Janne Napoleon, NP 04/22/16 1720

## 2016-04-22 NOTE — Discharge Instructions (Signed)
Obtain the generic form of MiraLAX and consumed 3 glasses, 1 capful powder in each class this evening. If he do not have good results overnight repeat the same regimen tomorrow. Increase fiber in your diet. Take your medicine for what appears to be bacterial vaginosis and follow-up with your primary care doctor for discussion of atrophic vaginitis and possible hormonal treatment. There is a small amount of bleeding within the vagina along the vaginal walls likely due to to a mild infection and the atrophic vaginitis. No other lesions or abnormalities are seen that are especially worrisome.

## 2016-04-24 LAB — CERVICOVAGINAL ANCILLARY ONLY: WET PREP (BD AFFIRM): NEGATIVE

## 2016-05-11 ENCOUNTER — Encounter: Payer: Self-pay | Admitting: Internal Medicine

## 2016-05-11 ENCOUNTER — Ambulatory Visit (INDEPENDENT_AMBULATORY_CARE_PROVIDER_SITE_OTHER): Payer: Commercial Managed Care - PPO | Admitting: Internal Medicine

## 2016-05-11 VITALS — BP 168/98 | HR 80 | Wt 167.8 lb

## 2016-05-11 DIAGNOSIS — E1165 Type 2 diabetes mellitus with hyperglycemia: Secondary | ICD-10-CM

## 2016-05-11 DIAGNOSIS — Z794 Long term (current) use of insulin: Secondary | ICD-10-CM

## 2016-05-11 NOTE — Progress Notes (Signed)
Pre visit review using our clinic review tool, if applicable. No additional management support is needed unless otherwise documented below in the visit note. 

## 2016-05-11 NOTE — Progress Notes (Signed)
Subjective:     Patient ID: Denise Beard, female   DOB: 1961-02-24, 55 y.o.   MRN: [id number]  Diabetes    Denise Beard is a pleasant 55 y.o. woman, returning for f/u for DM2, dx 2012, insulin-dependent, uncontrolled, without complications. Last visit 5 mo ago.  She had a busier work schedule.  Her last hemoglobin A1c: Lab Results  Component Value Date   HGBA1C 7.3 (H) 04/06/2016   HGBA1C 6.9 12/16/2015   HGBA1C 7.2 09/16/2015  Previously 7.9% (02/18/2012).   We started Tanzeum at last visit, replacing Januvia. We also increased Lantus a little. She did develop some dizziness and sugars in the 90s 07/2015 and at that point, we decreased glipizide.  She is on:   - Lantus 25 units at bedtime. - Tanzeum 30 mg weekly - Metformin 1000 mg 2x a day - Glipizide XL 10 mg in am and 5 >> 10 mg crushed before dinner   She was on saxagliptin/metformin XR 09/998 mg (Kombyglyze) in the past. She started on on JanuMet 50/1000 bid on 07/17/2013 >> lightheaded, HA, blurry vision.  She checks 2-3x a week - per log: - am: 113, 146-218, 225 >> 108, 118-221 >> 113-201, 224 >> 104, 132-211 - 2h after b'fast: n/c >> 179, 201 >> n/c >> 177-294 >> 203 >> n/c >> 103-176, 236 - before lunch: n/c>> 134, 160, 243 >> 212 >> 123, 134, 201  >> n/c - 2h after lunch: n/c >> 178 >> 141-254 >> 162, 238 >> 74-136, 158 >> 112, 129 - before dinner:  141-278 >> 137-143, 255 >> 146, 201 >> 68x1 66 >> 76-194, 217 - 2h after dinner: 164-240, 350 >> 182-304 >> 105, 149-271 >> 74-136 >> 128-170, 210 - bedtime:  129-242 >> see above >> 200-294 >> 187-270, 340 >> 101-229 >> 164 Lowest: 90s >> 68x1 >> 76. Strips are expensive. Meter: One Touch Ultra 2  She had nutrition classes with [employer] (nutrition) in the past.   - Last eye exam 09/30/2015. No DR. (Dr [facility]) - no CKD; no neuropathy.  Lab Results  Component Value Date   BUN 8 04/06/2016   CREATININE 0.65 04/06/2016  ACR was 1.9 on  10/06/2012. - Lipids:  Lab Results  Component Value Date   CHOL 141 04/06/2016   HDL 41.80 04/06/2016   LDLCALC 84 04/06/2016   LDLDIRECT 179.6 07/17/2013   TRIG 76.0 04/06/2016   CHOLHDL 3 04/06/2016  She is on Zocor.  Foot exam: normal: 05/10/2016  She also has a history of anxiety; asthma; GERD; constipation; polyarthropathy; seizure disorder.   Review of Systems Constitutional: no weight loss, no fatigue, no subjective hyperthermia/hypothermia Eyes: no blurry vision, no xerophthalmia ENT: no sore throat, no nodules palpated in throat, no dysphagia/odynophagia, no hoarseness Cardiovascular: no CP/SOB/palpitations/leg swelling Respiratory: no cough/SOB/wheezing Gastrointestinal: no N/V/D/C Musculoskeletal: no muscle/joint aches Skin: no rashes, no hair loss Neurological: no tremors/numbness/tingling/dizziness  I reviewed pt's medications, allergies, PMH, social hx, family hx, and changes were documented in the history of present illness. Otherwise, unchanged from my initial visit note.  Objective:   Physical Exam  BP (!) 168/98 (BP Location: Left Arm, Patient Position: Sitting, Cuff Size: Normal)   Pulse 80   Wt 167 lb 12.8 oz (76.1 kg)   SpO2 99%   BMI 26.68 kg/m  Body mass index is 26.68 kg/m.  Wt Readings from Last 3 Encounters:  05/11/16 167 lb 12.8 oz (76.1 kg)  04/10/16 165 lb 9.6  oz (75.1 kg)  12/16/15 164 lb (74.4 kg)  Constitutional: slightly overweight, in NAD Eyes: PERRLA, EOMI, no exophthalmos ENT: moist mucous membranes, no thyromegaly, no cervical lymphadenopathy Cardiovascular: RRR, No MRG Respiratory: CTA B Gastrointestinal: abdomen soft, NT, ND, BS+ Skin: moist, warm Neuro: DTR normal in all 4  Assessment:     1. DM2, insulin-dependent, uncontrolled, without long term complications, but with hyperglycemia    Plan:     Pt with worse sugars today after being more busy at work >> now finished the busy period. She has hyperglycemic spikes 2/2  dietary indiscretions. Now back on track with her diet.  - The hemoglobin A1c checked recently was 7.3% (higher) - At last visit, we increased Glipizide. She preferred this to increasing Tanzeum 2/2 nausea in the first 2 days after taking it. No lows today. - she felt low today in the office >> 120 - I advised her to: Patient Instructions  Please continue:  - Lantus 25 units at bedtime. - Tanzeum 30 mg weekly - Metformin 1000 mg 2x a day - Glipizide XL 10 mg in am and 10 mg before dinner (crushed)  Please return in 3 months with your sugar log.  - She is UTD with eye exams  - Had flu shot this season - RTC in 3 mo   Philemon Kingdom, MD PhD [facility] Endocrinology

## 2016-05-11 NOTE — Patient Instructions (Signed)
Please continue:  - Lantus 25 units at bedtime. - Tanzeum 30 mg weekly - Metformin 1000 mg 2x a day - Glipizide XL 10 mg in am and 10 mg before dinner (crushed)  Please return in 3 months with your sugar log.

## 2016-05-29 ENCOUNTER — Other Ambulatory Visit: Payer: Self-pay

## 2016-05-29 ENCOUNTER — Telehealth: Payer: Self-pay | Admitting: Internal Medicine

## 2016-05-29 MED ORDER — DULAGLUTIDE 0.75 MG/0.5ML ~~LOC~~ SOAJ: SUBCUTANEOUS | 1 refills | Status: DC

## 2016-05-29 NOTE — Telephone Encounter (Signed)
Left message advising of change in medication. Advised to call back in three weeks.

## 2016-05-29 NOTE — Telephone Encounter (Signed)
The patient called to see if there another Rx that is similar to Albiglutide (TANZEUM) 30 MG PEN Her insurance will not cover any longer. She doesn't see Dr. Cruzita Lederer till March so she was wanting to see what Dr. Cruzita Lederer is wanting her to do In the mean while.

## 2016-05-29 NOTE — Telephone Encounter (Signed)
Let's try to send Trulicity [id number] mg weekly 4 pens with 1 refill and please have her call us in 3 weeks to let us know whether we need to call the higher dose, 1.5 mg weekly for her next prescription.

## 2016-05-29 NOTE — Telephone Encounter (Signed)
Called and left message, advising of medication change and to call back in three weeks.

## 2016-06-08 ENCOUNTER — Encounter: Payer: Self-pay | Admitting: Family Medicine

## 2016-06-08 ENCOUNTER — Ambulatory Visit (INDEPENDENT_AMBULATORY_CARE_PROVIDER_SITE_OTHER): Payer: Commercial Managed Care - PPO | Admitting: Family Medicine

## 2016-06-08 VITALS — BP 108/84 | HR 115 | Temp 99.8°F | Ht 66.5 in | Wt 167.4 lb

## 2016-06-08 DIAGNOSIS — J111 Influenza due to unidentified influenza virus with other respiratory manifestations: Secondary | ICD-10-CM | POA: Diagnosis not present

## 2016-06-08 NOTE — Progress Notes (Signed)
PCP: [name], MD  Subjective:  Denise Beard is a 56 y.o. year old very pleasant female patient who presents with flu like symptoms including fever,body aches.  -other symptoms: cough, nasal congestion -started: wendesday evening. Rapid onset of symptoms including fever.  -inside 48 hour treatment window if needed for tamiflu: yes, started on tamiflu Thursday morning (within 24 hours) through [facility] -high risk condition (children <5, adults >65, chronic pulmonary or cardiac condition, immunosuppression, pregnancy, nursing home resident, morbid obesity) : yes, diabetes -symptoms are improving in regards to fever curve (first day no temperature over 100 today) -previous treatments: acetaminophen, tamiflu  ROS-denies NVD, sinus or dental pain. Mild shortness of breath  Pertinent Past Medical History-  Patient Active Problem List   Diagnosis Date Noted  . Type II diabetes mellitus, well controlled ([place]) 04/06/2009    Priority: High  . Hyperlipidemia 02/04/2015    Priority: Medium  . Insomnia 02/04/2015    Priority: Medium  . Asthma 06/26/2007    Priority: Medium  . Osteoarthritis 02/04/2015    Priority: Low  . Vasovagal syncope 02/04/2015    Priority: Low  . Allergic rhinitis 02/04/2015    Priority: Low  . Irritable bowel syndrome 02/27/2010    Priority: Low  . GERD 06/26/2007    Priority: Low    Medications- reviewed  Current Outpatient Prescriptions  Medication Sig Dispense Refill  . Albiglutide (TANZEUM) 30 MG PEN Inject 30 mg once a week 4 each 5  . albuterol (PROVENTIL HFA;VENTOLIN HFA) 108 (90 Base) MCG/ACT inhaler Inhale 2 puffs into the lungs every 6 (six) hours as needed for wheezing or shortness of breath. 1 Inhaler 2  . budesonide-formoterol (SYMBICORT) 80-4.5 MCG/ACT inhaler Inhale 2 puffs into the lungs 2 (two) times daily. 1 Inhaler 11  . Dulaglutide (TRULICITY) [id number] SOPN Inject weekly 4 pen 1  . glipiZIDE (GLUCOTROL XL) 5 MG 24 hr tablet Take  10 mg in the AM and 10 mg before dinner. 360 tablet 3  . Insulin Glargine (LANTUS SOLOSTAR) 100 UNIT/ML Solostar Pen Inject 25 Units into the skin daily at 10 pm. 5 pen 4  . Insulin Pen Needle (CAREFINE PEN NEEDLES) 32G X 4 MM MISC Use 1x a day 100 each 2  . metFORMIN (GLUCOPHAGE) 1000 MG tablet TAKE 1 TABLET (1,000 MG TOTAL) BY MOUTH 2 (TWO) TIMES DAILY WITH A MEAL. 180 tablet 3  . oseltamivir (TAMIFLU) 75 MG capsule Take 75 mg by mouth 2 (two) times daily.    . simvastatin (ZOCOR) 20 MG tablet TAKE 1 TABLET BY MOUTH AT BEDTIME 90 tablet 2  . zolpidem (AMBIEN) 10 MG tablet Take 1 tablet (10 mg total) by mouth at bedtime as needed. for sleep 30 tablet 5   No current facility-administered medications for this visit.     Objective: BP 108/84 (BP Location: Left Arm, Patient Position: Sitting, Cuff Size: Normal)   Pulse (!) 115   Temp 99.8 F (37.7 C) (Oral)   Ht 5' 6.5" (1.689 m)   Wt 167 lb 6.4 oz (75.9 kg)   SpO2 92%   BMI 26.61 kg/m  Gen: NAD, appears fatigued HEENT: Turbinates erythematous, TM normal, pharynx mildly erythematous with no tonsilar exudate or edema, no sinus tenderness CV: regular rate, mildly tachycardic, no murmurs rubs or gallops Lungs: CTAB no crackles, wheeze, rhonchi Abdomen: soft/nontender/nondistended/normal bowel sounds. Ext: no edema, no calf tenderness or edema Skin: warm, dry, no rash  Assessment/Plan:  Flu-like illness: high probability influenza History and exam today  are suggestive of viral process. Patients influenza test was not done.  Pretest probability of influenza was high. Was already on tamiflu at time of visit so we did not think testing would alter decision making  Prophylaxis for other [place] patients: lives alone.  Symptomatic treatment with: rest, hydration, tylenol  Finally, we reviewed reasons to return to care including if symptoms worsen or persist or new concerns arise. Out of work through Monday. May return Tuesday as long as  significant improvement of symptoms and no fever for 24 hours.   HR high but likely due to elevated temperature- doubt PE. No signs/symptoms of DVT and cough/congestion likely due to influenza  Meds ordered this encounter  Medications  . oseltamivir (TAMIFLU) 75 MG capsule    Sig: Take 75 mg by mouth 2 (two) times daily.    [name], MD

## 2016-06-08 NOTE — Patient Instructions (Addendum)
Flu.   Return to see [place] if symptoms do not continue to slowly improve or if new symptoms that concern you. Usually lasts 1-2 weeks.

## 2016-06-19 IMAGING — CT CT NECK W/O CM
4 of 5 series · 16 of 33 positions shown, 18 images · non-contrast
Comparison: None.

CLINICAL DATA: Left lateral neck mass for 3 weeks. Sore throat.
Headache.

EXAM:
CT NECK WITHOUT CONTRAST
TECHNIQUE: Multidetector CT imaging of the neck was performed following the
standard protocol without intravenous contrast.

[Series 3: neck_w/o 3.0 b40s st · axial · 0.46mm/px · z∈[-188,-71]mm · 3 of 79 slices shown, 4 images]
[im 20/79  soft-tissue]
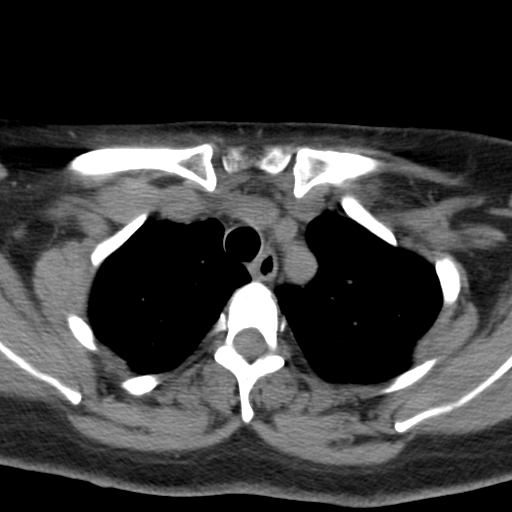
[im 20/79  bone]
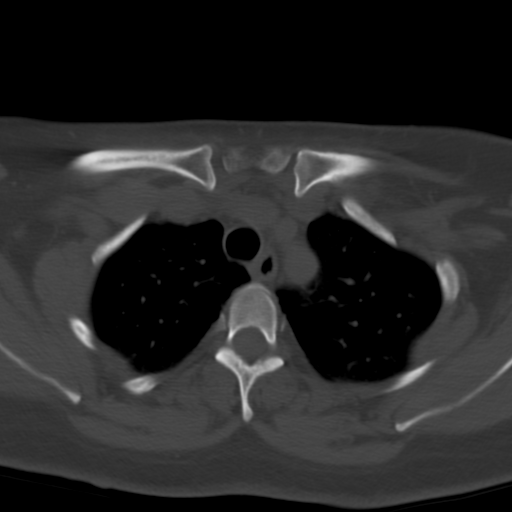
[im 40/79  bone]
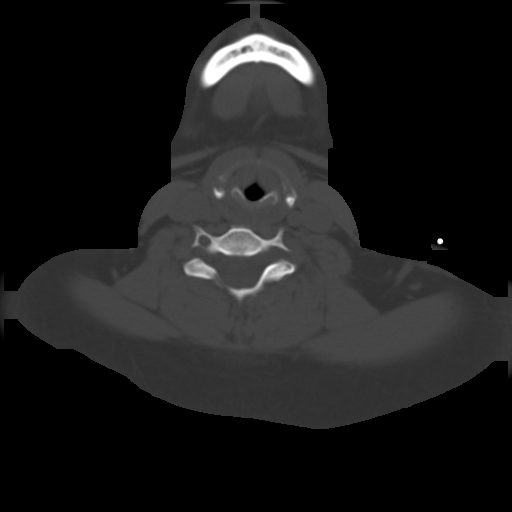
[im 59/79  bone]
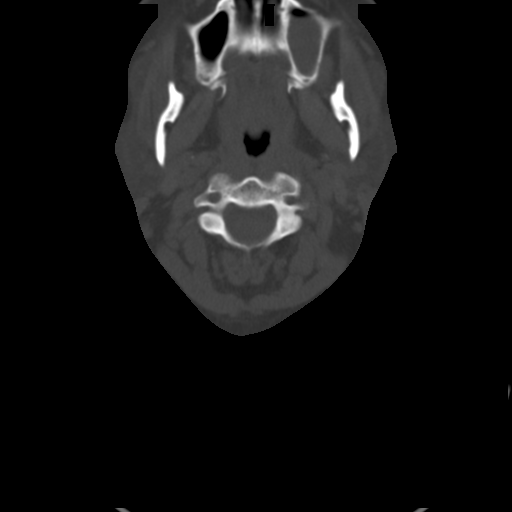

[Series 602: cor · coronal · 0.46mm/px · 3 of 102 slices shown]
[im 21/102  bone]
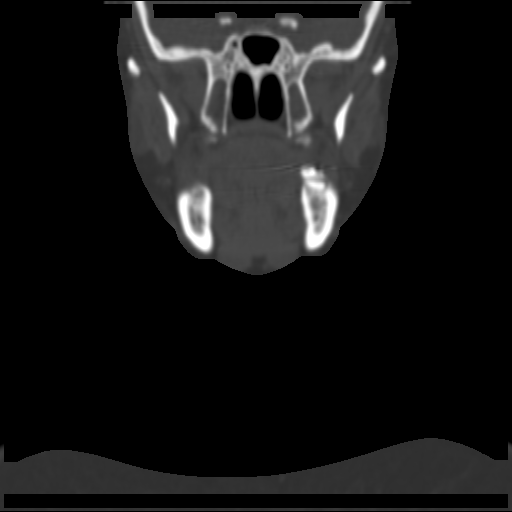
[im 41/102  bone]
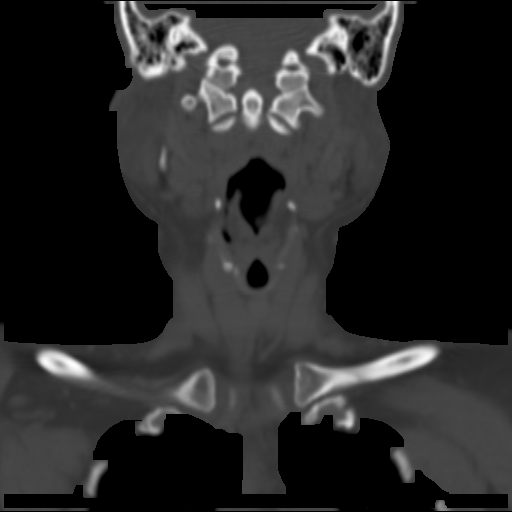
[im 61/102  bone]
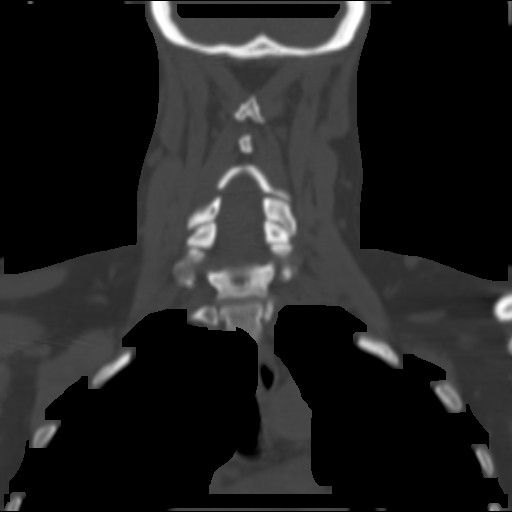

[Series 603: sag · sagittal · 0.46mm/px · 5 of 108 slices shown, 6 images]
[im 36/108  bone]
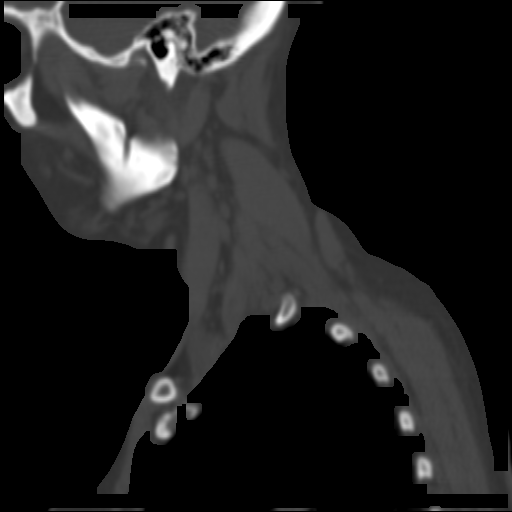
[im 45/108  bone]
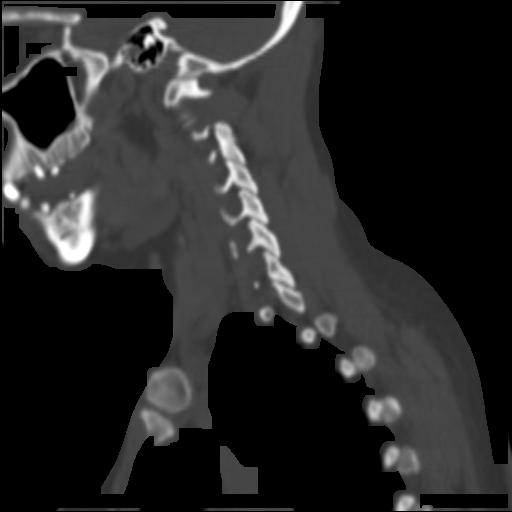
[im 54/108  soft-tissue]
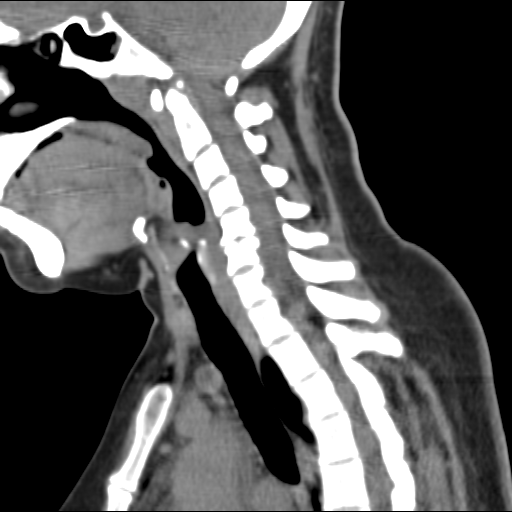
[im 54/108  bone]
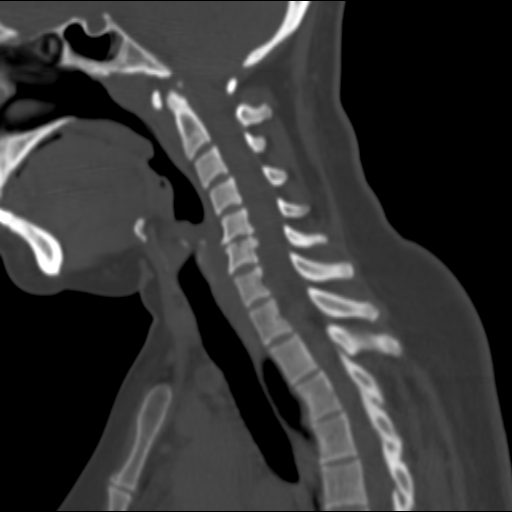
[im 63/108  bone]
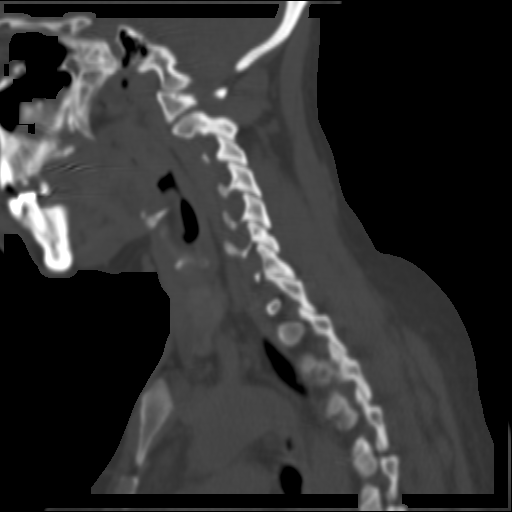
[im 72/108  bone]
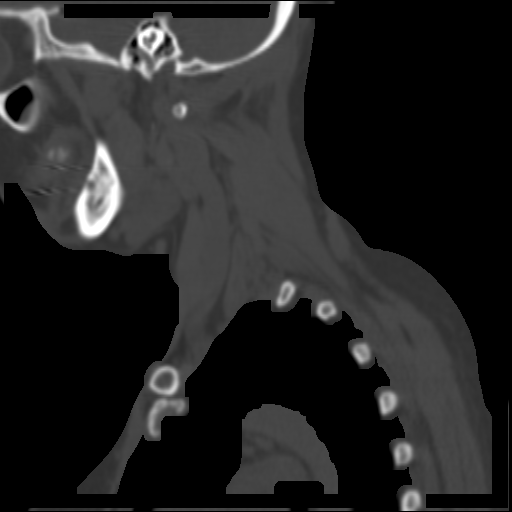

[Series 604: axials · axial · 0.40mm/px · z∈[-230,-94]mm · 5 of 108 slices shown]
[im 18/108  bone]
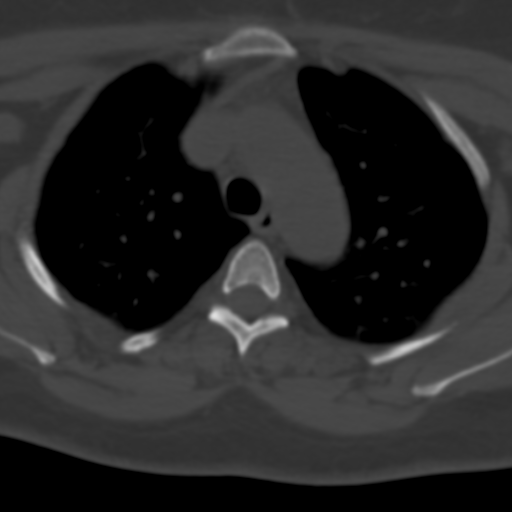
[im 36/108  bone]
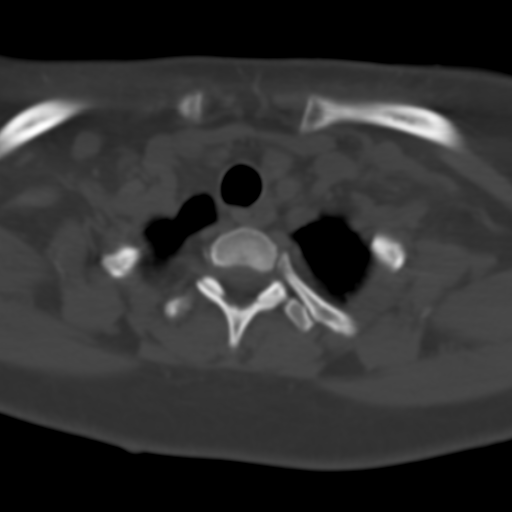
[im 54/108  bone]
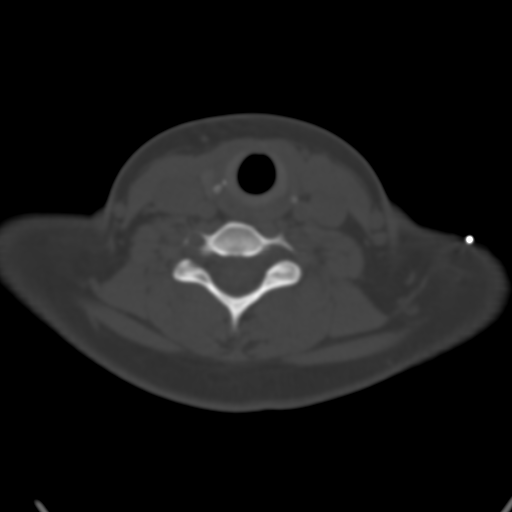
[im 72/108  bone]
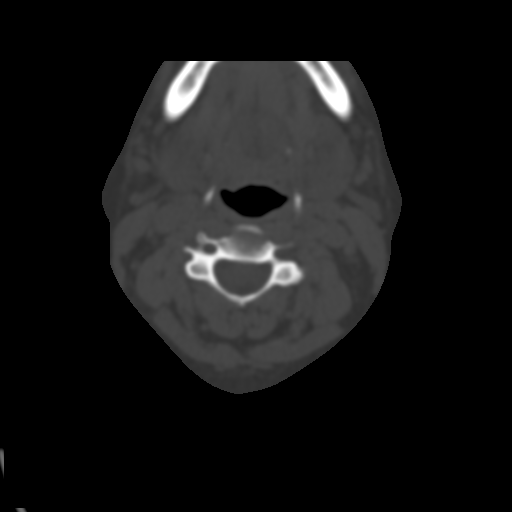
[im 90/108  bone]
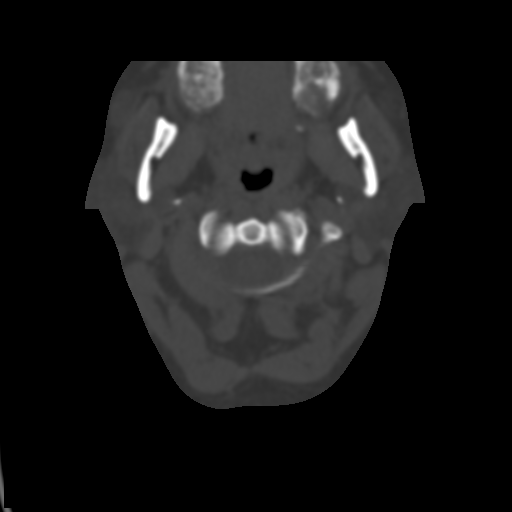

[16 of 33 positions shown; findings below may reference images not displayed]

FINDINGS: Pharynx and Larynx: No evidence of mass.

Salivary Glands: No mass, stone, or inflammation.

Thyroid:  No concerning nodule.

Lymph nodes:  No enlarged or necrotic appearing lymph nodes.

Skeleton: No acute or aggressive findings. Degenerative disc disease
at C5-6.

Upper Chest: No adenopathy or suspicious pulmonary nodule.

There is prominent but normal appearing subcutaneous fat in the left
supraclavicular region in the area of concern. The patient has an
asymmetrically prominent vein in the subcutaneous fat at that site
which may increase the prominence. This is of no concern.
IMPRESSION: Negative CT scan neck and supraclavicular regions. No mass lesion.
Prominent but otherwise normal subcutaneous fat in the area of
concern.

## 2016-06-24 ENCOUNTER — Other Ambulatory Visit: Payer: Self-pay | Admitting: Internal Medicine

## 2016-06-28 ENCOUNTER — Other Ambulatory Visit: Payer: Self-pay | Admitting: Internal Medicine

## 2016-06-30 ENCOUNTER — Other Ambulatory Visit: Payer: Self-pay | Admitting: Internal Medicine

## 2016-07-02 ENCOUNTER — Telehealth: Payer: Self-pay | Admitting: Internal Medicine

## 2016-07-02 NOTE — Telephone Encounter (Signed)
Pt needs her Lantus refilled and sent into [facility] on [address].

## 2016-07-03 ENCOUNTER — Other Ambulatory Visit: Payer: Self-pay

## 2016-07-03 MED ORDER — INSULIN GLARGINE 100 UNIT/ML SOLOSTAR PEN: 25.0000 [IU] | PEN_INJECTOR | Freq: Every day | SUBCUTANEOUS | 4 refills | Status: DC

## 2016-07-03 NOTE — Telephone Encounter (Signed)
rx submitted.  

## 2016-07-24 ENCOUNTER — Other Ambulatory Visit: Payer: Self-pay | Admitting: Family Medicine

## 2016-07-25 ENCOUNTER — Other Ambulatory Visit: Payer: Self-pay | Admitting: Internal Medicine

## 2016-07-25 ENCOUNTER — Telehealth: Payer: Self-pay

## 2016-07-25 ENCOUNTER — Other Ambulatory Visit: Payer: Self-pay | Admitting: Family Medicine

## 2016-07-25 NOTE — Telephone Encounter (Signed)
Called patient to ask how the dosage of the Trulicity was going, or if we needed to increase it. Gave call back number.

## 2016-07-26 ENCOUNTER — Other Ambulatory Visit: Payer: Self-pay

## 2016-07-26 ENCOUNTER — Telehealth: Payer: Self-pay | Admitting: Internal Medicine

## 2016-07-26 MED ORDER — DULAGLUTIDE 0.75 MG/0.5ML ~~LOC~~ SOAJ: SUBCUTANEOUS | 1 refills | Status: DC

## 2016-07-26 NOTE — Telephone Encounter (Signed)
rx submitted.  

## 2016-07-26 NOTE — Telephone Encounter (Signed)
Pt called in about her Trulicity, she stated that she feels that the dosage can be left the same for now and will discuss how things are going when she sees Dr. Cruzita Lederer on the 23rd.  She states she is out and needs that sent in.

## 2016-07-26 NOTE — Telephone Encounter (Signed)
Yes, no pb! We can send that.

## 2016-08-10 ENCOUNTER — Encounter: Payer: Self-pay | Admitting: Internal Medicine

## 2016-08-10 ENCOUNTER — Ambulatory Visit (INDEPENDENT_AMBULATORY_CARE_PROVIDER_SITE_OTHER): Payer: Commercial Managed Care - PPO | Admitting: Internal Medicine

## 2016-08-10 VITALS — BP 118/82 | HR 92 | Ht 65.0 in | Wt 165.0 lb

## 2016-08-10 DIAGNOSIS — E119 Type 2 diabetes mellitus without complications: Secondary | ICD-10-CM | POA: Diagnosis not present

## 2016-08-10 LAB — POCT GLYCOSYLATED HEMOGLOBIN (HGB A1C): HEMOGLOBIN A1C: 7.3

## 2016-08-10 NOTE — Progress Notes (Signed)
Subjective:     Patient ID: Denise Beard, female   DOB: [birth year], 56 y.o.   MRN: [id number]  Diabetes    Denise Beard is a pleasant 56 y.o. woman, returning for f/u for DM2, dx 2012, insulin-dependent, uncontrolled, without complications. Last visit 3 mo ago.  Her last hemoglobin A1c: Lab Results  Component Value Date   HGBA1C 7.3 (H) 04/06/2016   HGBA1C 6.9 12/16/2015   HGBA1C 7.2 09/16/2015   She is on:   - Lantus 25 units at bedtime. - Trulicity 9.37 mg weekly - Metformin 1000 mg 2x a day - Glipizide XL 10 mg in am and 5 >> 10 mg crushed before dinner   She was on saxagliptin/metformin XR 09/998 mg (Kombyglyze) in the past. She started on on JanuMet 50/1000 bid on 07/17/2013 >> lightheaded, HA, blurry vision.  She checks 2-3x a week - per log: - am: 113, 146-218, 225 >> 108, 118-221 >> 113-201, 224 >> 104, 132-211 >> 105, 124--251 (forgets Lantus) - 2h after b'fast: n/c >> 179, 201 >> n/c >> 177-294 >> 203 >> n/c >> 103-176, 236 >> 221 - before lunch: n/c>> 134, 160, 243 >> 212 >> 123, 134, 201  >> n/c >> 176 - 2h after lunch: n/c >> 178 >> 141-254 >> 162, 238 >> 74-136, 158 >> 112, 129 >> 103 - before dinner:  141-278 >> 137-143, 255 >> 146, 201 >> 68x1 66 >> 76-194, 217 >> 90-163 - 2h after dinner: 164-240, 350 >> 182-304 >> 105, 149-271 >> 74-136 >> 128-170, 210 >> 145-205, 228 - bedtime:  129-242 >> see above >> 200-294 >> 187-270, 340 >> 101-229 >> 164 >> 145-241 Lowest: 90s >> 68x1 >> 76. Strips are expensive. Meter: One Touch Ultra 2  She had nutrition classes with Db education (nutrition) in the past.   - no CKD; no neuropathy.  Lab Results  Component Value Date   BUN 8 04/06/2016   CREATININE 0.65 04/06/2016  ACR was 1.9 on 10/06/2012. - Lipids:  Lab Results  Component Value Date   CHOL 141 04/06/2016   HDL 41.80 04/06/2016   LDLCALC 84 04/06/2016   LDLDIRECT 179.6 07/17/2013   TRIG 76.0 04/06/2016   CHOLHDL 3 04/06/2016  She is on Zocor.  - Last eye  exam 09/30/2015. No DR. (Dr Macarthur Critchley - [facility]) - Foot exam: normal: 05/10/2016  She also has a history of anxiety; asthma; GERD; constipation; polyarthropathy; seizure disorder.   Review of Systems Constitutional: no weight loss, no fatigue, no subjective hyperthermia/hypothermia Eyes: no blurry vision, no xerophthalmia ENT: no sore throat, no nodules palpated in throat, no dysphagia/odynophagia, no hoarseness Cardiovascular: no CP/SOB/palpitations/leg swelling Respiratory: no cough/SOB/wheezing Gastrointestinal: no N/V/D/C Musculoskeletal: no muscle/joint aches Skin: no rashes, no hair loss Neurological: no tremors/numbness/tingling/dizziness  I reviewed pt's medications, allergies, PMH, social hx, family hx, and changes were documented in the history of present illness. Otherwise, unchanged from my initial visit note.  Objective:   Physical Exam  BP 118/82 (BP Location: Left Arm, Patient Position: Sitting)   Pulse 92   Ht 5\' 5"  (1.651 m)   Wt 165 lb (74.8 kg)   SpO2 96%   BMI 27.46 kg/m  Body mass index is 27.46 kg/m.  Wt Readings from Last 3 Encounters:  08/10/16 165 lb (74.8 kg)  06/08/16 167 lb 6.4 oz (75.9 kg)  05/11/16 167 lb 12.8 oz (76.1 kg)  Constitutional: slightly overweight, in NAD Eyes: PERRLA, EOMI, no exophthalmos ENT: moist mucous membranes,  no thyromegaly, no cervical lymphadenopathy Cardiovascular: RRR, No MRG Respiratory: CTA B Gastrointestinal: abdomen soft, NT, ND, BS+ Skin: moist, warm Neuro: DTR normal in all 4  Assessment:     1. DM2, insulin-dependent, uncontrolled, without long term complications, but with hyperglycemia    Plan:     Pt with worse sugars at last visit after being more busy at work >> now sugars still high especially in am after she forgets Lantus at night (falls asleep) >> advised to move this after dinner. I also advised her to increase Trulicity to the higher dose, 1.5 mg weekly. She will inject 2 x 0.75 mg  on Sunday and will let me know which dose I can Rx (had nausea with higher Tanzeum dose before). - Last hemoglobin A1c was 7.3% (higher) >> today: stable, 7.3% - I advised her to: Patient Instructions  Please continue:  - Lantus 25 units - but move it after dinner  - Trulicity 2.33 mg weekly - try to increase to 1.5 mg weekly - Metformin 1000 mg 2x a day - Glipizide XL 10 mg in am and 10 mg before dinner (crushed)  Let me know what dose of Trulicity to send to the pharmacy.  Please return in 3 months with your sugar log.  - She is UTD with eye exams  - Had flu shot this season - RTC in 3 mo   Philemon Kingdom, MD PhD [facility] Endocrinology

## 2016-08-10 NOTE — Patient Instructions (Addendum)
Please continue:  - Lantus 25 units - but move it after dinner  - Trulicity 0.21 mg weekly - try to increase to 1.5 mg weekly - Metformin 1000 mg 2x a day - Glipizide XL 10 mg in am and 10 mg before dinner (crushed)  Let me know what dose of Trulicity to send to the pharmacy.  Please return in 3 months with your sugar log.

## 2016-08-16 MED ORDER — DULAGLUTIDE 0.75 MG/0.5ML ~~LOC~~ SOAJ: SUBCUTANEOUS | 5 refills | Status: DC

## 2016-08-16 NOTE — Telephone Encounter (Signed)
Patient stated the Dulaglutide (TRULICITY) 1.43 OO/8.7NZ SOPN is working well, she would like a prescription sent to   [id number] - [place], [place] - [address] [phone] (Phone) [phone] (Fax)

## 2016-08-16 NOTE — Telephone Encounter (Signed)
Refill submitted. 

## 2016-08-25 ENCOUNTER — Other Ambulatory Visit: Payer: Self-pay | Admitting: Internal Medicine

## 2016-10-23 ENCOUNTER — Other Ambulatory Visit: Payer: Self-pay | Admitting: Family Medicine

## 2016-11-20 ENCOUNTER — Other Ambulatory Visit: Payer: Self-pay | Admitting: Internal Medicine

## 2016-11-30 ENCOUNTER — Encounter: Payer: Self-pay | Admitting: Internal Medicine

## 2016-11-30 ENCOUNTER — Ambulatory Visit (INDEPENDENT_AMBULATORY_CARE_PROVIDER_SITE_OTHER): Payer: Commercial Managed Care - PPO | Admitting: Internal Medicine

## 2016-11-30 VITALS — BP 122/84 | HR 73 | Ht 65.0 in | Wt 165.0 lb

## 2016-11-30 DIAGNOSIS — E119 Type 2 diabetes mellitus without complications: Secondary | ICD-10-CM | POA: Diagnosis not present

## 2016-11-30 LAB — POCT GLYCOSYLATED HEMOGLOBIN (HGB A1C): Hemoglobin A1C: 7

## 2016-11-30 MED ORDER — DULAGLUTIDE 1.5 MG/0.5ML ~~LOC~~ SOAJ: SUBCUTANEOUS | 5 refills | Status: DC

## 2016-11-30 NOTE — Progress Notes (Signed)
Subjective:     Patient ID: Denise Beard, female   DOB: 1961/02/03, 56 y.o.   MRN: [id number]  Diabetes    Denise Beard is a pleasant 56 y.o. woman, returning for f/u for DM2, dx 2012, insulin-dependent, uncontrolled, without complications. Last visit 4 mo ago.  Her last hemoglobin A1c: Lab Results  Component Value Date   HGBA1C 7.3 08/10/2016   HGBA1C 7.3 (H) 04/06/2016   HGBA1C 6.9 12/16/2015   She is on:   - Lantus 25 units after dinner (moved as she was forgetting this when at bedtime) - Trulicity 5.27 mg weekly >> 1.5 mg weekly  - Metformin 1000 mg 2x a day - Glipizide XL 10 mg in am and 5 >> 10 mg crushed before dinner   She was on saxagliptin/metformin XR 09/998 mg (Kombyglyze) in the past. She was on JanuMet 50/1000 bid in 2015 >> lightheaded, HA, blurry vision.  She checks 1-2x a day - per log: - am: 113-201, 224 >> 104, 132-211 >> 105, 124--251 (forgets Lantus)  >> 84, 97, 115-238 - 2h after b'fast:  203 >> n/c >> 103-176, 236 >> 221 >> n/c - before lunch:212 >> 123, 134, 201  >> n/c >> 176 >> n/c - 2h after lunch:74-136, 158 >> 112, 129 >> 103 >> n/c - before dinner:   68x1 66 >> 76-194, 217 >> 90-163 >> 92-126 - 2h after dinner:  74-136 >> 128-170, 210 >> 145-205, 228 >> 98-211 - bedtime:   187-270, 340 >> 101-229 >> 164 >> 145-241 >> n/c Lowest: 90s >> 68x1 >> 76 >> 92 > 84. Highest: 238 (chips + cookie).  Meter: One Touch Ultra 2  She had nutrition classes with Db education (nutrition) in the past.   - No CKD; no neuropathy.  Lab Results  Component Value Date   BUN 8 04/06/2016   CREATININE 0.65 04/06/2016   Lab Results  Component Value Date   MICRALBCREAT 0.8 04/06/2016   MICRALBCREAT 2.4 02/04/2015   MICRALBCREAT 0.4 10/30/2013   MICRALBCREAT 18.2 07/17/2013   MICRALBCREAT 1.9 10/06/2012   MICRALBCREAT 1.5 07/15/2012   MICRALBCREAT 1.4 02/26/2011   - Lipids:  Lab Results  Component Value Date   CHOL 141 04/06/2016   HDL 41.80 04/06/2016   LDLCALC 84 04/06/2016   LDLDIRECT 179.6 07/17/2013   TRIG 76.0 04/06/2016   CHOLHDL 3 04/06/2016  On Zocor.  - Last eye exam 09/2015 >> No DR (Dr Macarthur Critchley [facility]) - Foot exam: normal: 04/2016  She also has a history of anxiety; asthma; GERD; constipation; polyarthropathy; seizure disorder.   Review of Systems Constitutional: no weight gain/no weight loss, no fatigue, no subjective hyperthermia, no subjective hypothermia Eyes: no blurry vision, no xerophthalmia ENT: no sore throat, no nodules palpated in throat, no dysphagia, no odynophagia, no hoarseness Cardiovascular: no CP/no SOB/no palpitations/no leg swelling Respiratory: no cough/no SOB/no wheezing Gastrointestinal: no N/no V/no D/no C/no acid reflux Musculoskeletal: no muscle aches/no joint aches Skin: no rashes, no hair loss Neurological: no tremors/no numbness/no tingling/no dizziness  I reviewed pt's medications, allergies, PMH, social hx, family hx, and changes were documented in the history of present illness. Otherwise, unchanged from my initial visit note.   Objective:   Physical Exam  BP 122/84   Pulse 73   Ht 5\' 5"  (1.651 m)   Wt 165 lb (74.8 kg)   SpO2 98%   BMI 27.46 kg/m  Body mass index is 27.46 kg/m.  Wt Readings from Last  3 Encounters:  11/30/16 165 lb (74.8 kg)  08/10/16 165 lb (74.8 kg)  06/08/16 167 lb 6.4 oz (75.9 kg)   Constitutional: slightly overweight, in NAD Eyes: PERRLA, EOMI, no exophthalmos ENT: moist mucous membranes, no thyromegaly, no cervical lymphadenopathy Cardiovascular: RRR, No MRG Respiratory: CTA B Gastrointestinal: abdomen soft, NT, ND, BS+ Musculoskeletal: no deformities, strength intact in all 4 Skin: moist, warm, no rashes Neurological: no tremor with outstretched hands, DTR normal in all 4   Assessment:     1. DM2, insulin-dependent, uncontrolled, without long term complications, but with hyperglycemia    Plan:     Pt with worse sugars In  the morning, unclear why. She is now not forgetting Lantus anymore, after we move this from bedtime to after dinner. I suspect that she is having a larger dinner or some snacks afterwards. I advised her to start writing down the reason for the spikes in the 200s in the morning. - At last visit, she started to take 2 injections of the Trulicity 4.96 mcg, but however, she resumed only taking one afterwards. At this visit, I advised her to go back to the higher dose of Trulicity and I will call this into her pharmacy  - We also discussed about the FreeStyle libre CGM and I gave her a brochure to check with her pharmacy whether this is covered - I advised her to: Patient Instructions  Please continue:  - Lantus 25 units after dinner - Trulicity 1.5 mg weekly (discussed to increase this) - Metformin 1000 mg 2x a day - Glipizide XL 10 mg in am and 10 mg before dinner (crushed)  Please check some sugars before bedtime.  Add comments to your sugar log to explain the highs.  Please return in 3 months with your sugar log.  - today, HbA1c is 7% (lower) - continue checking sugars at different times of the day - check 2x a day, rotating checks - advised for yearly eye exams >> she needs one >> scheduled for next week - Return to clinic in 3 mo with sugar log   Philemon Kingdom, MD PhD [facility] Endocrinology

## 2016-11-30 NOTE — Patient Instructions (Addendum)
Please continue:  - Lantus 25 units after dinner - Trulicity 1.5 mg weekly  - Metformin 1000 mg 2x a day - Glipizide XL 10 mg in am and 10 mg before dinner (crushed)  Please check some sugars before bedtime.  Add comments to your sugar log to explain the highs.  Please return in 3 months with your sugar log.

## 2016-12-07 LAB — HM DIABETES EYE EXAM

## 2016-12-31 ENCOUNTER — Other Ambulatory Visit: Payer: Self-pay | Admitting: Family Medicine

## 2017-01-03 ENCOUNTER — Other Ambulatory Visit: Payer: Self-pay | Admitting: Internal Medicine

## 2017-01-03 DIAGNOSIS — Z1231 Encounter for screening mammogram for malignant neoplasm of breast: Secondary | ICD-10-CM | POA: Diagnosis not present

## 2017-01-03 DIAGNOSIS — Z01419 Encounter for gynecological examination (general) (routine) without abnormal findings: Secondary | ICD-10-CM | POA: Diagnosis not present

## 2017-01-03 LAB — HM PAP SMEAR: HM PAP: NORMAL

## 2017-01-03 LAB — HM MAMMOGRAPHY: HM MAMMO: NORMAL (ref 0–4)

## 2017-01-19 ENCOUNTER — Other Ambulatory Visit: Payer: Self-pay | Admitting: Family Medicine

## 2017-01-24 ENCOUNTER — Other Ambulatory Visit: Payer: Self-pay | Admitting: Internal Medicine

## 2017-03-08 ENCOUNTER — Ambulatory Visit: Payer: Commercial Managed Care - PPO | Admitting: Internal Medicine

## 2017-03-16 ENCOUNTER — Other Ambulatory Visit: Payer: Self-pay | Admitting: Internal Medicine

## 2017-03-26 ENCOUNTER — Other Ambulatory Visit: Payer: Self-pay

## 2017-03-26 MED ORDER — DULAGLUTIDE 1.5 MG/0.5ML ~~LOC~~ SOAJ: SUBCUTANEOUS | 5 refills | Status: DC

## 2017-04-25 ENCOUNTER — Telehealth: Payer: Self-pay | Admitting: Family Medicine

## 2017-04-25 NOTE — Telephone Encounter (Signed)
Copied from [place] [phone]. Topic: Quick Communication - See Telephone Encounter >> Apr 25, 2017 11:45 AM Bea Graff, NT wrote: CRM for notification. See Telephone encounter for: Pt requesting refill of her Ambien. [facility] [address]  04/25/17.

## 2017-04-26 ENCOUNTER — Other Ambulatory Visit: Payer: Self-pay

## 2017-04-26 MED ORDER — ZOLPIDEM TARTRATE 10 MG PO TABS: 10.0000 mg | ORAL_TABLET | Freq: Every evening | ORAL | 0 refills | Status: DC | PRN

## 2017-04-26 NOTE — Telephone Encounter (Signed)
Pt is requesting a refill on her Ambien please.

## 2017-04-26 NOTE — Telephone Encounter (Signed)
Received paper refill request this morning and refilled this medication via fax.

## 2017-05-17 ENCOUNTER — Encounter: Payer: Self-pay | Admitting: Internal Medicine

## 2017-05-17 ENCOUNTER — Ambulatory Visit: Payer: Commercial Managed Care - PPO | Admitting: Internal Medicine

## 2017-05-17 VITALS — BP 120/80 | HR 81 | Ht 65.0 in | Wt 167.2 lb

## 2017-05-17 DIAGNOSIS — E785 Hyperlipidemia, unspecified: Secondary | ICD-10-CM

## 2017-05-17 DIAGNOSIS — E119 Type 2 diabetes mellitus without complications: Secondary | ICD-10-CM | POA: Diagnosis not present

## 2017-05-17 LAB — POCT GLYCOSYLATED HEMOGLOBIN (HGB A1C): HEMOGLOBIN A1C: 6.9

## 2017-05-17 NOTE — Addendum Note (Signed)
Addended by: Drucilla Schmidt on: 05/17/2017 04:00 PM   Modules accepted: Orders

## 2017-05-17 NOTE — Patient Instructions (Addendum)
Please continue:  - Lantus 25 units after dinner - Trulicity 1.5 mg weekly - Metformin 1000 mg 2x a day - Glipizide XL 10 mg in am and 10 mg before dinner (crushed)  Please return in 3-4 months with your sugar log.

## 2017-05-17 NOTE — Progress Notes (Signed)
Subjective:     Patient ID: Denise Beard, female   DOB: 01/23/1961, 56 y.o.   MRN: [id number]  Diabetes    Denise Beard is a pleasant 56 y.o. woman, returning for f/u for DM2, dx 2012, insulin-dependent, uncontrolled, without complications. Last visit 5 months ago.  Her father died since last visit.  Her last hemoglobin A1c: Lab Results  Component Value Date   HGBA1C 7.0 11/30/2016   HGBA1C 7.3 08/10/2016   HGBA1C 7.3 (H) 04/06/2016   She is on:   - Lantus 25 units after dinner (moved as she was forgetting this when she was taking it at bedtime) - Trulicity 6.37 mg weekly >> 1.5 mg weekly  - Metformin 1000 mg 2x a day - Glipizide XL 10 mg in am and 5 >> 10 mg crushed before dinner   She was on saxagliptin/metformin XR 09/998 mg (Kombyglyze) in the past. She was on JanuMet 50/1000 bid in 2015 >> lightheaded, HA, blurry vision.  She checks 0-1 times a day-forgot log: - am: 105, 124--251 (forgets Lantus)  >> 84, 97, 115-238 >> 116, 180 - 2h after b'fast: 103-176, 236 >> 221 >> n/c - before lunch 123, 134, 201  >> n/c >> 176 >> n/c - 2h after lunch:74-136, 158 >> 112, 129 >> 103 >> n/c - before dinner: 76-194, 217 >> 90-163 >> 92-126 >> ? - 2h after dinner:128-170, 210 >> 145-205, 228 >> 98-211 >> ? - bedtime: 101-229 >> 164 >> 145-241 >> n/c Lowest: 92 > 84 >> 116  Highest: 238 (chips + cookie) >> 224 x1.  Meter:  One Touch ultra 2  She had nutrition classes with Db education (nutrition) in the past.   - No CKD; no neuropathy.  Lab Results  Component Value Date   BUN 8 04/06/2016   CREATININE 0.65 04/06/2016   Lab Results  Component Value Date   MICRALBCREAT 0.8 04/06/2016   MICRALBCREAT 2.4 02/04/2015   MICRALBCREAT 0.4 10/30/2013   MICRALBCREAT 18.2 07/17/2013   MICRALBCREAT 1.9 10/06/2012   MICRALBCREAT 1.5 07/15/2012   MICRALBCREAT 1.4 02/26/2011   - + HL: Lipids:  Lab Results  Component Value Date   CHOL 141 04/06/2016   HDL 41.80 04/06/2016   LDLCALC 84  04/06/2016   LDLDIRECT 179.6 07/17/2013   TRIG 76.0 04/06/2016   CHOLHDL 3 04/06/2016  On Zocor.  - Last eye exam 11/2016 >> no DR (Dr Macarthur Critchley [facility]) - Foot exam: normal: 04/2016.  She also has a history of anxiety; asthma; GERD; constipation; polyarthropathy; seizure disorder.   Review of Systems Constitutional: no weight gain/no weight loss, no fatigue, no subjective hyperthermia, no subjective hypothermia Eyes: no blurry vision, no xerophthalmia ENT: no sore throat, no nodules palpated in throat, no dysphagia, no odynophagia, no hoarseness Cardiovascular: no CP/no SOB/no palpitations/no leg swelling Respiratory: no cough/no SOB/no wheezing Gastrointestinal: no N/no V/no D/no C/no acid reflux Musculoskeletal: no muscle aches/no joint aches Skin: no rashes, no hair loss Neurological: no tremors/no numbness/no tingling/no dizziness  I reviewed pt's medications, allergies, PMH, social hx, family hx, and changes were documented in the history of present illness. Otherwise, unchanged from my initial visit note.  Objective:   Physical Exam  BP 120/80   Pulse 81   Ht 5\' 5"  (1.651 m)   Wt 167 lb 3.2 oz (75.8 kg)   SpO2 97%   BMI 27.82 kg/m  Body mass index is 27.82 kg/m.  Wt Readings from Last 3 Encounters:  05/17/17  167 lb 3.2 oz (75.8 kg)  11/30/16 165 lb (74.8 kg)  08/10/16 165 lb (74.8 kg)   Constitutional: overweight, in NAD Eyes: PERRLA, EOMI, no exophthalmos ENT: moist mucous membranes, no thyromegaly, no cervical lymphadenopathy Cardiovascular: RRR, No MRG Respiratory: CTA B Gastrointestinal: abdomen soft, NT, ND, BS+ Musculoskeletal: no deformities, strength intact in all 4 Skin: moist, warm, no rashes Neurological: no tremor with outstretched hands, DTR normal in all 4   Assessment:     1. DM2, insulin-dependent, uncontrolled, without long term complications, but with hyperglycemia  2. HL    Plan:     Pt with long standing  uncontrolled DM, on basal insulin + GLP1 R agonist + oral regimen. - At last visit, we increased Trulicity to 1.5 mg weekly. We also discussed about the freestyle libre CGM and I gave her a brochure about it. She did not get this. - at this visit, she does not have many CBG checks as she had a lot of stress with her father dying in 03/2017 and with her moving to live with her mother. - however, today, HbA1c is 6.9% (slightly better) - we will not change her regimen, but advised her to start checking her sugars at least daily - I advised her to: Patient Instructions  Please continue:  - Lantus 25 units after dinner - Trulicity 1.5 mg weekly - Metformin 1000 mg 2x a day - Glipizide XL 10 mg in am and 10 mg before dinner (crushed)  Please return in 4 months with your sugar log.  - continue checking sugars at different times of the day - check 1x a day, rotating checks - advised for yearly eye exams >> she is UTD - due for CM p >> next week - Return to clinic in 4 mo with sugar log   2. HL  - Reviewed together her latest lipid panel from a year ago and her LDL has tremendously improved on the statin. - Continue Lipitor.  No side effects. - will have another Lipid panel at [facility] with PCP next week.   Philemon Kingdom, MD PhD [facility] Endocrinology

## 2017-05-24 ENCOUNTER — Encounter: Payer: Self-pay | Admitting: Family Medicine

## 2017-05-24 ENCOUNTER — Ambulatory Visit (INDEPENDENT_AMBULATORY_CARE_PROVIDER_SITE_OTHER): Payer: Commercial Managed Care - PPO | Admitting: Family Medicine

## 2017-05-24 VITALS — BP 108/78 | HR 77 | Temp 98.6°F | Ht 65.0 in | Wt 165.6 lb

## 2017-05-24 DIAGNOSIS — Z23 Encounter for immunization: Secondary | ICD-10-CM | POA: Diagnosis not present

## 2017-05-24 DIAGNOSIS — Z808 Family history of malignant neoplasm of other organs or systems: Secondary | ICD-10-CM | POA: Diagnosis not present

## 2017-05-24 DIAGNOSIS — E119 Type 2 diabetes mellitus without complications: Secondary | ICD-10-CM | POA: Diagnosis not present

## 2017-05-24 DIAGNOSIS — Z1283 Encounter for screening for malignant neoplasm of skin: Secondary | ICD-10-CM | POA: Diagnosis not present

## 2017-05-24 DIAGNOSIS — Z0000 Encounter for general adult medical examination without abnormal findings: Secondary | ICD-10-CM | POA: Diagnosis not present

## 2017-05-24 LAB — LIPID PANEL
CHOL/HDL RATIO: 4
Cholesterol: 147 mg/dL (ref 0–200)
HDL: 38.3 mg/dL — AB (ref >39.00)
LDL Cholesterol: 89 mg/dL (ref 0–99)
NONHDL: 108.54
TRIGLYCERIDES: 99 mg/dL (ref 0.0–149.0)
VLDL: 19.8 mg/dL (ref 0.0–40.0)

## 2017-05-24 LAB — MICROALBUMIN / CREATININE URINE RATIO
CREATININE, U: 125.7 mg/dL
MICROALB/CREAT RATIO: 0.9 mg/g (ref 0.0–30.0)
Microalb, Ur: 1.1 mg/dL (ref 0.0–1.9)

## 2017-05-24 LAB — COMPREHENSIVE METABOLIC PANEL
ALT: 14 U/L (ref 0–35)
AST: 14 U/L (ref 0–37)
Albumin: 4.3 g/dL (ref 3.5–5.2)
Alkaline Phosphatase: 90 U/L (ref 39–117)
BILIRUBIN TOTAL: 0.4 mg/dL (ref 0.2–1.2)
BUN: 12 mg/dL (ref 6–23)
CALCIUM: 9.6 mg/dL (ref 8.4–10.5)
CO2: 28 meq/L (ref 19–32)
Chloride: 104 mEq/L (ref 96–112)
Creatinine, Ser: 0.67 mg/dL (ref 0.40–1.20)
GFR: 117.53 mL/min (ref >60.00)
GLUCOSE: 108 mg/dL — AB (ref 70–99)
POTASSIUM: 4.4 meq/L (ref 3.5–5.1)
Sodium: 140 mEq/L (ref 135–145)
Total Protein: 7.4 g/dL (ref 6.0–8.3)

## 2017-05-24 LAB — CBC
HEMATOCRIT: 39.3 % (ref 36.0–46.0)
HEMOGLOBIN: 12.6 g/dL (ref 12.0–15.0)
MCHC: 32.1 g/dL (ref 30.0–36.0)
MCV: 92.2 fl (ref 78.0–100.0)
PLATELETS: 308 10*3/uL (ref 150.0–400.0)
RBC: 4.26 Mil/uL (ref 3.87–5.11)
RDW: 13.3 % (ref 11.5–15.5)
WBC: 8.2 10*3/uL (ref 4.0–10.5)

## 2017-05-24 LAB — HEMOGLOBIN A1C: Hgb A1c MFr Bld: 7 % — ABNORMAL HIGH (ref 4.6–6.5)

## 2017-05-24 MED ORDER — SIMVASTATIN 20 MG PO TABS: 20.0000 mg | ORAL_TABLET | Freq: Every day | ORAL | 3 refills | Status: DC

## 2017-05-24 MED ORDER — ZOLPIDEM TARTRATE 10 MG PO TABS: 10.0000 mg | ORAL_TABLET | Freq: Every evening | ORAL | 5 refills | Status: DC | PRN

## 2017-05-24 NOTE — Assessment & Plan Note (Signed)
S: well controlled. On  trulicity, glipizide 5 mg XL daily, lantus around 25 units, metformin 1 g BID. Sees Dr. Cruzita Lederer Lab Results  Component Value Date   HGBA1C 6.9 05/17/2017   HGBA1C 7.0 11/30/2016   HGBA1C 7.3 08/10/2016   A/P:  updated foot exam today- normal. Excellent a1c! Thankful for endocrine's management

## 2017-05-24 NOTE — Progress Notes (Signed)
Phone: [phone]  Subjective:  Patient presents today for their annual physical. Chief complaint-noted.   See problem oriented charting- ROS- full  review of systems was completed and negative. Full ROS sheet completed and negative. No hypoglycemia as well.   The following were reviewed and entered/updated in epic: Past Medical History:  Diagnosis Date  . ANXIETY 03/17/2007   in past  . ASTHMA 06/26/2007  . DIAB W/O COMP TYPE II/UNS NOT STATED UNCNTRL 04/06/2009  . GERD 06/26/2007  . Irritable bowel syndrome 02/27/2010   constipation  . OA (osteoarthritis) 12/13/2009  . SEIZURE DISORDER 04/17/2010   in past /over 4 years ago/no meds. actually vasovagal per neuro notes   Patient Active Problem List   Diagnosis Date Noted  . Type II diabetes mellitus, well controlled ([place]) 04/06/2009    Priority: High  . Hyperlipidemia 02/04/2015    Priority: Medium  . Insomnia 02/04/2015    Priority: Medium  . Asthma 06/26/2007    Priority: Medium  . Osteoarthritis 02/04/2015    Priority: Low  . Vasovagal syncope 02/04/2015    Priority: Low  . Allergic rhinitis 02/04/2015    Priority: Low  . Irritable bowel syndrome 02/27/2010    Priority: Low  . GERD 06/26/2007    Priority: Low   Past Surgical History:  Procedure Laterality Date  . ABDOMINAL HYSTERECTOMY    . CESAREAN SECTION    . TUBAL LIGATION      Family History  Problem Relation Age of Onset  . Thyroid disease Mother   . Hyperlipidemia Mother   . Breast cancer Mother 26  . Skin cancer Father        not melanoma- passed at 21  . Colon cancer Maternal Uncle   . Stomach cancer Maternal Uncle   . Lung cancer Maternal Uncle   . Cancer Maternal Grandfather        unknown    Medications- reviewed and updated Current Outpatient Medications  Medication Sig Dispense Refill  . BD PEN NEEDLE NANO U/F 32G X 4 MM MISC USE ONCE DAILY 100 each 2  . budesonide-formoterol (SYMBICORT) 80-4.5 MCG/ACT inhaler Inhale 2 puffs into the  lungs 2 (two) times daily. 1 Inhaler 11  . Dulaglutide (TRULICITY) 1.5 UJ/8.1XB SOPN Inject 1.5 mg in a.m. under skin 4 pen 5  . glipiZIDE (GLUCOTROL XL) 5 MG 24 hr tablet TAKE 2 TABLETS BY MOUTH IN THE MORNING AND 2 TABLETS BEFORE DINNER 360 tablet 2  . LANTUS SOLOSTAR 100 UNIT/ML Solostar Pen INJECT 25 UNITS INTO THE SKIN DAILY AT 10 PM. 15 pen 4  . metFORMIN (GLUCOPHAGE) 1000 MG tablet TAKE 1 TABLET BY MOUTH TWICE A DAY WITH MEALS 180 tablet 2  . simvastatin (ZOCOR) 20 MG tablet Take 1 tablet (20 mg total) by mouth at bedtime. 90 tablet 3  . zolpidem (AMBIEN) 10 MG tablet Take 1 tablet (10 mg total) by mouth at bedtime as needed. for sleep 30 tablet 5   No current facility-administered medications for this visit.     Allergies-reviewed and updated Allergies  Allergen Reactions  . Iodine     Per the patient. Had an oral solution in the 90's that gave her a bad reaction of hallucinations.     Social History   Socioeconomic History  . Marital status: Divorced    Spouse name: None  . Number of children: None  . Years of education: None  . Highest education level: None  Social Needs  . Financial resource strain: None  .  Food insecurity - worry: None  . Food insecurity - inability: None  . Transportation needs - medical: None  . Transportation needs - non-medical: None  Occupational History  . None  Tobacco Use  . Smoking status: Never Smoker  . Smokeless tobacco: Never Used  Substance and Sexual Activity  . Alcohol use: No  . Drug use: No  . Sexual activity: None  Other Topics Concern  . None  Social History Narrative   Divorced/Single. 2 chldren. 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 Oropharynx normal Neck: no thyromegaly, no cervical lymphadenopathy CV: RRR no murmurs rubs or gallops Lungs: CTAB no crackles, wheeze, rhonchi Abdomen: soft/nontender/nondistended/normal bowel sounds. No rebound or guarding.  Ext: no edema Skin: warm, dry Neuro: grossly normal, moves all extremities, PERRLA  Diabetic Foot Exam - Simple   Simple Foot Form Diabetic Foot exam was performed with the following findings:  Yes 05/24/2017 11:03 AM  Visual Inspection No deformities, no ulcerations, no other skin breakdown bilaterally:  Yes Sensation Testing Intact to touch and monofilament testing bilaterally:  Yes Pulse Check Posterior Tibialis and Dorsalis pulse intact bilaterally:  Yes Comments    Assessment/Plan:  57 y.o. female presenting for annual physical.  Health Maintenance counseling: 1. Anticipatory guidance: Patient counseled regarding regular dental exams - agrees to call this year- has not seen in over a year, eye exams yearly, wearing seatbelts.  2. Risk factor reduction:  Advised patient of need for regular exercise and diet rich and fruits and vegetables to reduce risk of heart attack and stroke. Weight stable over 20 years- same weight as when she had her last daughter Wt Readings from Last 3 Encounters:  05/24/17 165 lb 9.6 oz (75.1 kg)  05/17/17 167 lb 3.2 oz (75.8 kg)  11/30/16 165 lb (74.8 kg)  3. Immunizations/screenings/ancillary studies- flu shot today Immunization History  Administered Date(s) Administered  . Influenza Whole 02/19/2011  . Influenza,inj,Quad PF,6+ Mos 05/24/2017  . Influenza-Unspecified 02/09/2015, 01/24/2016  . Pneumococcal Conjugate-13 11/10/2014  . Td 09/02/2007   4. Cervical cancer screening- obtaining records from GYN physicians for womens 5. Breast cancer screening-  breast exam with GYN and mammogram - requested  records today 6. Colon cancer screening - 01/2015 with 10 year follow up 7. Birth control/STD check-hysterectomy. Not sexually active 8. Osteoporosis screening at 89- will plan on this at 82, had one apparently already with GYN and no issues 9. Father with skin cancer history and died from this- we will refer to dermatology for skin cancer screening  Status of chronic or acute concerns   Asthma- controlled on symbicort with albuterol around once a week  HLD- controlled on simvastatin 20mg . Update lipids, cbc, cmp  Insomnia- on ambien at night. No issues driving  Type II diabetes mellitus, well controlled ([place]) S: well controlled. On  trulicity, glipizide 5 mg XL daily, lantus around 25 units, metformin 1 g BID. Sees Dr. Cruzita Lederer Lab Results  Component  Value Date   HGBA1C 6.9 05/17/2017   HGBA1C 7.0 11/30/2016   HGBA1C 7.3 08/10/2016   A/P:  updated foot exam today- normal. Excellent a1c! Thankful for endocrine's management   Future Appointments  Date Time Provider [place]  09/20/2017  4:15 PM Philemon Kingdom, MD [facility] None   At least once yearly physical. Could see in 6 months for ambien refill  Orders Placed This Encounter  Procedures  . Flu Vaccine QUAD 36+ mos IM  . CBC    [place]  . Comprehensive metabolic panel    [place]    Order Specific Question:   Has the patient fasted?    Answer:   No  . Lipid panel    [place]    Order Specific Question:   Has the patient fasted?    Answer:   No  . Hemoglobin A1c    [place]  . Microalbumin / creatinine urine ratio    [place]  . Ambulatory referral to Dermatology    Referral Priority:   Routine    Referral Type:   Consultation    Referral Reason:   Specialty Services Required    Requested Specialty:   Dermatology    Number of Visits Requested:   1    Meds ordered this encounter  Medications  . zolpidem (AMBIEN) 10 MG tablet    Sig: Take 1 tablet (10 mg total) by mouth at bedtime as needed. for sleep     Dispense:  30 tablet    Refill:  5  . simvastatin (ZOCOR) 20 MG tablet    Sig: Take 1 tablet (20 mg total) by mouth at bedtime.    Dispense:  90 tablet    Refill:  3    Return precautions advised.  [name], MD

## 2017-05-24 NOTE — Patient Instructions (Addendum)
We will call you within a week or two about your referral to dermatology. If you do not hear within 3 weeks, give [place] a call.   Make sure to get into your dentist  Please stop by lab before you go

## 2017-05-29 ENCOUNTER — Encounter: Payer: Self-pay | Admitting: Physical Therapy

## 2017-07-24 ENCOUNTER — Encounter: Payer: Self-pay | Admitting: Physician Assistant

## 2017-07-24 ENCOUNTER — Ambulatory Visit: Payer: Commercial Managed Care - PPO | Admitting: Physician Assistant

## 2017-07-24 VITALS — BP 140/80 | HR 75 | Temp 98.7°F | Ht 65.0 in | Wt 163.0 lb

## 2017-07-24 DIAGNOSIS — J069 Acute upper respiratory infection, unspecified: Secondary | ICD-10-CM

## 2017-07-24 LAB — POC INFLUENZA A&B (BINAX/QUICKVUE)
INFLUENZA B, POC: NEGATIVE
Influenza A, POC: NEGATIVE

## 2017-07-24 MED ORDER — BUDESONIDE-FORMOTEROL FUMARATE 80-4.5 MCG/ACT IN AERO: 2.0000 | INHALATION_SPRAY | Freq: Two times a day (BID) | RESPIRATORY_TRACT | 11 refills | Status: DC

## 2017-07-24 MED ORDER — AZITHROMYCIN 250 MG PO TABS: ORAL_TABLET | ORAL | 0 refills | Status: DC

## 2017-07-24 MED ORDER — BENZONATATE 200 MG PO CAPS: 200.0000 mg | ORAL_CAPSULE | Freq: Three times a day (TID) | ORAL | 0 refills | Status: DC | PRN

## 2017-07-24 MED ORDER — ALBUTEROL SULFATE HFA 108 (90 BASE) MCG/ACT IN AERS: 2.0000 | INHALATION_SPRAY | Freq: Four times a day (QID) | RESPIRATORY_TRACT | 0 refills | Status: DC | PRN

## 2017-07-24 NOTE — Progress Notes (Addendum)
Denise Beard is a 57 y.o. female here for a new problem.  I acted as a Education administrator for [employer], PA-C Anselmo Pickler, LPN  History of Present Illness:   Chief Complaint  Patient presents with  . Cough    Cough  This is a new problem. Episode onset: Started 4 days ago. The problem has been gradually worsening. The problem occurs constantly. The cough is non-productive. Associated symptoms include headaches, nasal congestion, a sore throat, shortness of breath and wheezing. Pertinent negatives include no chills, ear congestion, ear pain, fever, heartburn or postnasal drip. Associated symptoms comments: Chest congestion. The symptoms are aggravated by lying down. Risk factors for lung disease include occupational exposure. She has tried nothing for the symptoms. Her past medical history is significant for asthma and bronchitis. Hx walking pneumonia   Lab Results  Component Value Date   HGBA1C 7.0 (H) 05/24/2017   No diarrhea. CBG was 159 this morning (this is low for her.)  Appetite is fair. Trying to push fluids. Using Symbicort as needed -- does not take on a regular basis and does not use albuterol.   Past Medical History:  Diagnosis Date  . ANXIETY 03/17/2007   in past  . ASTHMA 06/26/2007  . DIAB W/O COMP TYPE II/UNS NOT STATED UNCNTRL 04/06/2009  . GERD 06/26/2007  . Irritable bowel syndrome 02/27/2010   constipation  . OA (osteoarthritis) 12/13/2009  . SEIZURE DISORDER 04/17/2010   in past /over 4 years ago/no meds. actually vasovagal per neuro notes     Social History   Socioeconomic History  . Marital status: Divorced    Spouse name: Not on file  . Number of children: Not on file  . Years of education: Not on file  . Highest education level: Not on file  Social Needs  . Financial resource strain: Not on file  . Food insecurity - worry: Not on file  . Food insecurity - inability: Not on file  . Transportation needs - medical: Not on file  . Transportation needs -  non-medical: Not on file  Occupational History  . Not on file  Tobacco Use  . Smoking status: Never Smoker  . Smokeless tobacco: Never Used  Substance and Sexual Activity  . Alcohol use: No  . Drug use: No  . Sexual activity: Not on file  Other Topics Concern  . Not on file  Social History Narrative   Divorced/Single. 2 chldren. 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 ABDOMINAL HYSTERECTOMY    . CESAREAN SECTION    . TUBAL LIGATION      Family History  Problem Relation Age of Onset  . Thyroid disease Mother   . Hyperlipidemia Mother   . Breast cancer Mother 15  . Skin cancer Father        not melanoma- passed at 29  . Colon cancer Maternal Uncle   . Stomach cancer Maternal Uncle   . Lung cancer Maternal Uncle   . Cancer Maternal Grandfather        unknown    Allergies  Allergen Reactions  . Iodine     Per the patient. Had an oral solution in the 90's that gave her a bad reaction of hallucinations.  Current Medications:   Current Outpatient Medications:  .  BD PEN NEEDLE NANO U/F 32G X 4 MM MISC, USE ONCE DAILY, Disp: 100 each, Rfl: 2 .  budesonide-formoterol (SYMBICORT) 80-4.5 MCG/ACT inhaler, Inhale 2 puffs into the lungs 2 (two) times daily., Disp: 1 Inhaler, Rfl: 11 .  Dulaglutide (TRULICITY) 1.5 DX/8.3JA SOPN, Inject 1.5 mg in a.m. under skin, Disp: 4 pen, Rfl: 5 .  glipiZIDE (GLUCOTROL XL) 5 MG 24 hr tablet, TAKE 2 TABLETS BY MOUTH IN THE MORNING AND 2 TABLETS BEFORE DINNER, Disp: 360 tablet, Rfl: 2 .  LANTUS SOLOSTAR 100 UNIT/ML Solostar Pen, INJECT 25 UNITS INTO THE SKIN DAILY AT 10 PM., Disp: 15 pen, Rfl: 4 .  metFORMIN (GLUCOPHAGE) 1000 MG tablet, TAKE 1 TABLET BY MOUTH TWICE A DAY WITH MEALS, Disp: 180 tablet, Rfl: 2 .  simvastatin (ZOCOR) 20 MG tablet, Take 1 tablet  (20 mg total) by mouth at bedtime., Disp: 90 tablet, Rfl: 3 .  zolpidem (AMBIEN) 10 MG tablet, Take 1 tablet (10 mg total) by mouth at bedtime as needed. for sleep, Disp: 30 tablet, Rfl: 5 .  albuterol (PROVENTIL HFA;VENTOLIN HFA) 108 (90 Base) MCG/ACT inhaler, Inhale 2 puffs into the lungs every 6 (six) hours as needed for wheezing or shortness of breath., Disp: 1 Inhaler, Rfl: 0 .  azithromycin (ZITHROMAX) 250 MG tablet, Take two tablets daily x 1 day, then 1 tablet daily x 4 days, Disp: 6 tablet, Rfl: 0 .  benzonatate (TESSALON) 200 MG capsule, Take 1 capsule (200 mg total) by mouth 3 (three) times daily as needed for cough., Disp: 20 capsule, Rfl: 0   Review of Systems:   Review of Systems  Constitutional: Negative for chills and fever.  HENT: Positive for sore throat. Negative for ear pain and postnasal drip.   Respiratory: Positive for cough, shortness of breath and wheezing.   Gastrointestinal: Negative for heartburn.  Neurological: Positive for headaches.    Vitals:   Vitals:   07/24/17 1257  BP: 140/80  Pulse: 75  Temp: 98.7 F (37.1 C)  TempSrc: Oral  SpO2: 97%  Weight: 163 lb (73.9 kg)  Height: 5\' 5"  (1.651 m)     Body mass index is 27.12 kg/m.  Physical Exam:   Physical Exam  Constitutional: She appears well-developed. She is cooperative.  Non-toxic appearance. She does not have a sickly appearance. She does not appear ill. No distress.  HENT:  Head: Normocephalic and atraumatic.  Right Ear: Tympanic membrane, external ear and ear canal normal. Tympanic membrane is not erythematous, not retracted and not bulging.  Left Ear: Tympanic membrane, external ear and ear canal normal. Tympanic membrane is not erythematous, not retracted and not bulging.  Nose: Mucosal edema and rhinorrhea present. Right sinus exhibits no maxillary sinus tenderness and no frontal sinus tenderness. Left sinus exhibits no maxillary sinus tenderness and no frontal sinus tenderness.   Mouth/Throat: Uvula is midline and mucous membranes are normal. Posterior oropharyngeal erythema present. No posterior oropharyngeal edema. Tonsils are 1+ on the right. Tonsils are 1+ on the left. No tonsillar exudate.  Eyes: Conjunctivae and lids are normal.  Neck: Trachea normal.  Cardiovascular: Normal rate, regular rhythm, S1 normal, S2 normal and normal heart sounds.  Pulmonary/Chest: Effort normal and breath sounds normal. She has no decreased breath sounds. She has no wheezes. She has no rhonchi. She has no rales.  Lymphadenopathy:    She has no cervical adenopathy.  Neurological: She is alert.  Skin: Skin is warm, dry  and intact.  Psychiatric: She has a normal mood and affect. Her speech is normal and behavior is normal.  Nursing note and vitals reviewed.  Results for orders placed or performed in visit on 07/24/17  POC Influenza A&B(BINAX/QUICKVUE)  Result Value Ref Range   Influenza A, POC Negative Negative   Influenza B, POC Negative Negative     Assessment and Plan:    Denise Beard was seen today for cough.  Diagnoses and all orders for this visit:  Upper respiratory tract infection, unspecified type No red flags on exam.  Will initiate scheduled Symbicort, prn Albuterol, prn Tessalon Perles per orders. Discussed taking medications as prescribed. Provided her a "wait and see" prescription of z-pack. Reviewed return precautions including worsening fever, SOB, worsening cough or other concerns. Push fluids and rest. I recommend that patient follow-up if symptoms worsen or persist despite treatment x 7-10 days, sooner if needed. -     POC Influenza A&B(BINAX/QUICKVUE)  Other orders -     albuterol (PROVENTIL HFA;VENTOLIN HFA) 108 (90 Base) MCG/ACT inhaler; Inhale 2 puffs into the lungs every 6 (six) hours as needed for wheezing or shortness of breath. -     benzonatate (TESSALON) 200 MG capsule; Take 1 capsule (200 mg total) by mouth 3 (three) times daily as needed for  cough. -     azithromycin (ZITHROMAX) 250 MG tablet; Take two tablets daily x 1 day, then 1 tablet daily x 4 days -     budesonide-formoterol (SYMBICORT) 80-4.5 MCG/ACT inhaler; Inhale 2 puffs into the lungs 2 (two) times daily.    . Reviewed expectations re: course of current medical issues. . Discussed self-management of symptoms. . Outlined signs and symptoms indicating need for more acute intervention. . Patient verbalized understanding and all questions were answered. . See orders for this visit as documented in the electronic medical record. . Patient received an After-Visit Summary.  CMA or LPN served as scribe during this visit. History, Physical, and Plan performed by medical provider. Documentation and orders reviewed and attested to.  Inda Coke, PA-C

## 2017-07-24 NOTE — Patient Instructions (Signed)
It was great to see you!  You have a viral upper respiratory infection. Antibiotics are not needed for this.  Viral infections usually take 7-10 days to resolve.  The cough can last a few weeks to go away. Use medication as prescribed: Albuterol inhaler as needed; Symbicort inhaler DAILY, tessalon perles as needed for cough Consider starting antibiotic if you are not improving after a few days or having worsening symptoms. If you start antibiotic, take as prescribed.  Push fluids and get plenty of rest. Please return if you are not improving as expected, or if you have high fevers (>101.5) or difficulty swallowing or worsening productive cough.  Call clinic with questions.  I hope you start feeling better soon!

## 2017-09-06 ENCOUNTER — Other Ambulatory Visit: Payer: Self-pay | Admitting: Internal Medicine

## 2017-09-20 ENCOUNTER — Encounter: Payer: Self-pay | Admitting: Internal Medicine

## 2017-09-20 ENCOUNTER — Ambulatory Visit (INDEPENDENT_AMBULATORY_CARE_PROVIDER_SITE_OTHER): Payer: Commercial Managed Care - PPO | Admitting: Internal Medicine

## 2017-09-20 VITALS — BP 116/62 | HR 61 | Ht 65.0 in | Wt 164.0 lb

## 2017-09-20 DIAGNOSIS — E119 Type 2 diabetes mellitus without complications: Secondary | ICD-10-CM | POA: Diagnosis not present

## 2017-09-20 DIAGNOSIS — E785 Hyperlipidemia, unspecified: Secondary | ICD-10-CM

## 2017-09-20 LAB — POCT GLYCOSYLATED HEMOGLOBIN (HGB A1C): Hemoglobin A1C: 6.5

## 2017-09-20 MED ORDER — INSULIN GLARGINE 100 UNIT/ML SOLOSTAR PEN: 30.0000 [IU] | PEN_INJECTOR | Freq: Every day | SUBCUTANEOUS | 4 refills | Status: DC

## 2017-09-20 MED ORDER — DULAGLUTIDE 1.5 MG/0.5ML ~~LOC~~ SOAJ: SUBCUTANEOUS | 11 refills | Status: DC

## 2017-09-20 MED ORDER — GLIPIZIDE ER 5 MG PO TB24
ORAL_TABLET | ORAL | 3 refills | Status: DC
Start: 2017-09-20 — End: 2018-05-02

## 2017-09-20 MED ORDER — METFORMIN HCL 1000 MG PO TABS: 1000.0000 mg | ORAL_TABLET | Freq: Two times a day (BID) | ORAL | 3 refills | Status: DC

## 2017-09-20 NOTE — Progress Notes (Signed)
Subjective:     Patient ID: Denise Beard, female   DOB: 1961/03/27, 57 y.o.   MRN: [id number]  Diabetes    Denise Beard is a pleasant 57 y.o. woman, returning for f/u for DM2, dx 2012, insulin-dependent, uncontrolled, without complications. Last visit 4 months ago.  Her last hemoglobin A1c: Lab Results  Component Value Date   HGBA1C 7.0 (H) 05/24/2017   HGBA1C 6.9 05/17/2017   HGBA1C 7.0 11/30/2016   She is on:   - Lantus 25 units after dinner  (moved as she was forgetting this when she was taking it at bedtime).  She still occasionally forgets it- Trulicity 8.29 mg weekly >> 1.5 mg weekly  - Metformin 1000 mg 2x a day - Glipizide XL 10 mg in am and 5 >> crush 10 mg tablet before dinner  She was on saxagliptin/metformin XR 09/998 mg (Kombyglyze) in the past. She was on JanuMet 50/1000 bid in 2015 >> lightheaded, HA, blurry vision.  She checks 1x a day: - am: 84, 97, 115-238 >> 116, 180 >>156-204, 224 - 2h after b'fast: 103-176, 236 >> 221 >> n/c >> 135-200, 239, 256 - before lunch 1 n/c >> 176 >> n/c >> 271 - 2h after lunch: 112, 129 >> 103 >> n/c - before dinner:90-163 >> 92-126 >> 164 - 2h after dinner: 145-205, 228 >> 98-211 >> 145-237, 243 - bedtime: 164 >> 145-241 >> n/c >> 132-196 - nighttime: 109-188 Lowest: 92 > 84 >> 116 >> 135 Highest: 238 (chips + cookie) >> 224 x1 >> 271  Meter:  One Touch ultra 2  She had nutrition education in the past.  - No CKD; latest BUN/creatinine: Lab Results  Component Value Date   BUN 12 05/24/2017   CREATININE 0.67 05/24/2017   No MAU: Lab Results  Component Value Date   MICRALBCREAT 0.9 05/24/2017   MICRALBCREAT 0.8 04/06/2016   MICRALBCREAT 2.4 02/04/2015   MICRALBCREAT 0.4 10/30/2013   MICRALBCREAT 18.2 07/17/2013   MICRALBCREAT 1.9 10/06/2012   MICRALBCREAT 1.5 07/15/2012   MICRALBCREAT 1.4 02/26/2011   - + HL: Lipids:  Lab Results  Component Value Date   CHOL 147 05/24/2017   HDL 38.30 (L) 05/24/2017   LDLCALC  89 05/24/2017   LDLDIRECT 179.6 07/17/2013   TRIG 99.0 05/24/2017   CHOLHDL 4 05/24/2017  On Zocor.  - Last eye exam 11/2016: No DR (Dr Macarthur Critchley [facility]) - No numbness or tingling in her feet.  She also has a history of anxiety; asthma; GERD; constipation; polyarthropathy; seizure disorder.   Review of Systems Constitutional: no weight gain/no weight loss, no fatigue, no subjective hyperthermia, no subjective hypothermia Eyes: no blurry vision, no xerophthalmia ENT: no sore throat, no nodules palpated in throat, no dysphagia, no odynophagia, no hoarseness Cardiovascular: no CP/no SOB/no palpitations/no leg swelling Respiratory: no cough/no SOB/no wheezing Gastrointestinal: no N/no V/no D/no C/no acid reflux Musculoskeletal: no muscle aches/no joint aches Skin: no rashes, no hair loss Neurological: no tremors/no numbness/no tingling/no dizziness  I reviewed pt's medications, allergies, PMH, social hx, family hx, and changes were documented in the history of present illness. Otherwise, unchanged from my initial visit note.  Objective:   Physical Exam  BP 116/62   Pulse 61   Ht 5\' 5"  (1.651 m)   Wt 164 lb (74.4 kg)   SpO2 99%   BMI 27.29 kg/m  Body mass index is 27.29 kg/m.  Wt Readings from Last 3 Encounters:  09/20/17 164 lb (74.4 kg)  07/24/17 163 lb (73.9 kg)  05/24/17 165 lb 9.6 oz (75.1 kg)   Constitutional: overweight, in NAD Eyes: PERRLA, EOMI, no exophthalmos ENT: moist mucous membranes, no thyromegaly, no cervical lymphadenopathy Cardiovascular: RRR, No MRG Respiratory: CTA B Gastrointestinal: abdomen soft, NT, ND, BS+ Musculoskeletal: no deformities, strength intact in all 4 Skin: moist, warm, no rashes Neurological: no tremor with outstretched hands, DTR normal in all 4   Assessment:     1. DM2, insulin-dependent, uncontrolled, without long term complications, but with hyperglycemia  2. HL    Plan:     Pt with long-standing,  uncontrolled, type 2 diabetes, on basal insulin, oral regimen, and GLP-1 receptor agonist.  At last visit, HbA1c was slightly better, is 6.9% and we did not change her regimen, but I advised her to start checking sugars daily as she was not taking them consistently. -This visit, sugars are variable, with quite significant hyperglycemic spikes.  These are more indicative for insulin deficiency, so we may need to check her for this at the next visit. - For now, I would like to optimize the way she is taking her regimen: Try not to forget the Lantus, move glipizide 30 minutes before meals as she is not taking mostly after meals and she does mention that she gets hungry after she takes the medication.  I also advised her to crush the glipizide before dinner, as she forgot that she was supposed to do this; will also increase her Lantus dose by 20%. - At next visit, we may need mealtime insulin - I advised her to: Patient Instructions  Please increase:  - Lantus to 30 units after dinner  Continue - Trulicity 1.5 mg weekly  - Metformin 1000 mg 2x a day  Move: - Glipizide XL to before meals: 10 mg in am and 10 mg (crushed) before dinner  Please return in 3 months with your sugar log.   - today, HbA1c is 6.5% (improved, blood sugars appear worse)  - continue checking sugars at different times of the day - check 1-2x a day, rotating checks - advised for yearly eye exams >> she is UTD - Return to clinic in 3 mo with sugar log   2. HL  - Reviewed latest lipid panel from 05/2017: LDL dramatically improved, now at goal - Continues Lipitor without side effects.   Philemon Kingdom, MD PhD [facility] Endocrinology

## 2017-09-20 NOTE — Patient Instructions (Addendum)
Please increase:  - Lantus to 30 units after dinner  Continue - Trulicity 1.5 mg weekly  - Metformin 1000 mg 2x a day  Move: - Glipizide XL to before meals: 10 mg in am and 10 mg (crushed) before dinner  Please return in 3 months with your sugar log.

## 2017-11-18 ENCOUNTER — Other Ambulatory Visit: Payer: Self-pay | Admitting: Family Medicine

## 2017-11-18 ENCOUNTER — Telehealth: Payer: Self-pay | Admitting: Family Medicine

## 2017-11-18 NOTE — Telephone Encounter (Signed)
I signed this and placed on your desk

## 2017-11-18 NOTE — Telephone Encounter (Signed)
Copied from [place] [id number]. Topic: Quick Communication - Rx Refill/Question >> Nov 18, 2017  1:25 PM Waylan Rocher, Lumin L wrote: Medication: zolpidem (AMBIEN) 10 MG tablet (out of the script)  Has the patient contacted their pharmacy? Yes.   (Agent: If no, request that the patient contact the pharmacy for the refill.) (Agent: If yes, when and what did the pharmacy advise?)  Preferred Pharmacy (with phone number or street name): [id number] - [place], [place] [address] Phone: [phone] Fax: [phone]  Agent: Please be advised that RX refills may take up to 3 business days. We ask that you follow-up with your pharmacy.

## 2017-11-18 NOTE — Telephone Encounter (Signed)
Patient is due for a refill. Last filled 05/24/2017 with 5 refills for 30 day supply. Last office visit was in January as well. Please advise

## 2017-11-19 ENCOUNTER — Other Ambulatory Visit: Payer: Self-pay

## 2017-11-19 MED ORDER — ZOLPIDEM TARTRATE 10 MG PO TABS: 10.0000 mg | ORAL_TABLET | Freq: Every evening | ORAL | 5 refills | Status: DC | PRN

## 2017-11-19 NOTE — Telephone Encounter (Signed)
Spoke with Dr. Ansel Bong nurse.  Ambien was refilled 7/1, but did not process correctly.  Stated she will reorder Ambien and will fax printed Rx to pharmacy.   Call placed to pt.  Left voice message that Ambien is being reordered, and to check with her pharmacy.

## 2017-11-20 NOTE — Telephone Encounter (Signed)
I faxed it in to her pharmacy 11/19/17

## 2018-01-24 ENCOUNTER — Other Ambulatory Visit: Payer: Self-pay | Admitting: Internal Medicine

## 2018-01-24 ENCOUNTER — Ambulatory Visit: Payer: Commercial Managed Care - PPO | Admitting: Internal Medicine

## 2018-01-24 ENCOUNTER — Encounter: Payer: Self-pay | Admitting: Internal Medicine

## 2018-01-24 VITALS — BP 110/76 | HR 78 | Temp 98.1°F | Ht 65.0 in | Wt 165.0 lb

## 2018-01-24 DIAGNOSIS — E119 Type 2 diabetes mellitus without complications: Secondary | ICD-10-CM

## 2018-01-24 DIAGNOSIS — E785 Hyperlipidemia, unspecified: Secondary | ICD-10-CM | POA: Diagnosis not present

## 2018-01-24 LAB — POCT GLUCOSE (DEVICE FOR HOME USE): Glucose Fasting, POC: 146 mg/dL — AB (ref 70–99)

## 2018-01-24 LAB — POCT GLYCOSYLATED HEMOGLOBIN (HGB A1C): Hemoglobin A1C: 7.6 % — AB (ref 4.0–5.6)

## 2018-01-24 MED ORDER — INSULIN ASPART 100 UNIT/ML FLEXPEN: 6.0000 [IU] | PEN_INJECTOR | Freq: Every day | SUBCUTANEOUS | 1 refills | Status: DC

## 2018-01-24 NOTE — Patient Instructions (Addendum)
Please stop Glipizide.  Please increase Metformin to 1000 mg 2x a day with meals.  Continue: - Lantus  30 units after dinner  - Trulicity 1.5 mg weekly   Start: - NovoLog 6-8 units 15 min before dinner  Please return in 3 months with your sugar log.

## 2018-01-24 NOTE — Progress Notes (Signed)
Subjective:     Patient ID: Denise Beard, female   DOB: 1960/06/24, 57 y.o.   MRN: [id number]  Diabetes    Ms. Gitto is a pleasant 57 y.o. woman, returning for f/u for DM2, dx 2012, insulin-dependent, uncontrolled, without complications. Last visit 4 mo ago.  Since last visit, she developed some dizziness especially after taking glipizide despite not having low blood sugars.  She backed off the dose to 5 mg twice a day.  She also backed off the metformin dose to see if this was contributing to the problem also.  Her last hemoglobin A1c: Lab Results  Component Value Date   HGBA1C 6.5 09/20/2017   HGBA1C 7.0 (H) 05/24/2017   HGBA1C 6.9 05/17/2017   She is on:   - Lantus 25 >> 30 units after dinner  (moved as she was forgetting this when she was taking it at bedtime).  - Trulicity 1.5 mg weekly  - Metformin 1000 mg 2x a day >> 1000 mg daily at night - Glipizide XL 10 >> 5 mg in am and 10 >> 5 mg (crushed) before dinner  She was on saxagliptin/metformin XR 09/998 mg (Kombyglyze) in the past. She was on JanuMet 50/1000 bid in 2015 >> lightheaded, HA, blurry vision.  She checks 1x a day: - am: 156-204, 224 >> 92, 135, 173-244, 259 - 2h after b'fast: n/c >> 135-200, 239, 256 >> n/c - before lunch 1 n/c >> 176 >> n/c >> 271 >> n/c - 2h after lunch: 112, 129 >> 103 >> n/c >> 94 - before dinner:90-163 >> 92-126 >> 164 >> 102-160, 179 - 2h after dinner: 98-211 >> 145-237, 243 >> 140-234 - bedtime: 145-241 >> n/c >> 132-196 >> n/c - nighttime: 109-188 Lowest: 84 >> 116 >> 135 >> 92 Highest: 238 >> 224 x1 >> 271 >> 259  Meter:  One Touch ultra 2  She had nutrition education in the past.  - No CKD; latest BUN/creatinine: Lab Results  Component Value Date   BUN 12 05/24/2017   CREATININE 0.67 05/24/2017   No MAU: Lab Results  Component Value Date   MICRALBCREAT 0.9 05/24/2017   MICRALBCREAT 0.8 04/06/2016   MICRALBCREAT 2.4 02/04/2015   MICRALBCREAT 0.4 10/30/2013   MICRALBCREAT 18.2 07/17/2013   MICRALBCREAT 1.9 10/06/2012   MICRALBCREAT 1.5 07/15/2012   MICRALBCREAT 1.4 02/26/2011   - + HL: Lipids:  Lab Results  Component Value Date   CHOL 147 05/24/2017   HDL 38.30 (L) 05/24/2017   LDLCALC 89 05/24/2017   LDLDIRECT 179.6 07/17/2013   TRIG 99.0 05/24/2017   CHOLHDL 4 05/24/2017  On Zocor  - Last eye exam 11/2016: NO DR (Dr Macarthur Critchley [facility]) - No numbness or tingling in her feet.  She also has a history of anxiety; asthma; GERD; constipation; polyarthropathy; seizure disorder.   Review of Systems Constitutional: no weight gain/no weight loss, no fatigue, no subjective hyperthermia, no subjective hypothermia Eyes: no blurry vision, no xerophthalmia ENT: no sore throat, no nodules palpated in throat, no dysphagia, no odynophagia, no hoarseness Cardiovascular: no CP/no SOB/no palpitations/no leg swelling Respiratory: no cough/no SOB/no wheezing Gastrointestinal: no N/no V/no D/no C/no acid reflux Musculoskeletal: no muscle aches/no joint aches Skin: no rashes, no hair loss Neurological: no tremors/no numbness/no tingling/no dizziness  I reviewed pt's medications, allergies, PMH, social hx, family hx, and changes were documented in the history of present illness. Otherwise, unchanged from my initial visit note.  Past Medical History:  Diagnosis Date  .  ANXIETY 03/17/2007   in past  . ASTHMA 06/26/2007  . DIAB W/O COMP TYPE II/UNS NOT STATED UNCNTRL 04/06/2009  . GERD 06/26/2007  . Irritable bowel syndrome 02/27/2010   constipation  . OA (osteoarthritis) 12/13/2009  . SEIZURE DISORDER 04/17/2010   in past /over 4 years ago/no meds. actually vasovagal per neuro notes   Past Surgical History:  Procedure Laterality Date  . ABDOMINAL HYSTERECTOMY    . CESAREAN SECTION    . TUBAL LIGATION     Social History   Socioeconomic History  . Marital status: Divorced    Spouse name: Not on file  . Number of children: Not on  file  . Years of education: Not on file  . Highest education level: Not on file  Occupational History  . Not on file  Social Needs  . Financial resource strain: Not on file  . Food insecurity:    Worry: Not on file    Inability: Not on file  . Transportation needs:    Medical: Not on file    Non-medical: Not on file  Tobacco Use  . Smoking status: Never Smoker  . Smokeless tobacco: Never Used  Substance and Sexual Activity  . Alcohol use: No  . Drug use: No  . Sexual activity: Not on file  Lifestyle  . Physical activity:    Days per week: Not on file    Minutes per session: Not on file  . Stress: Not on file  Relationships  . Social connections:    Talks on phone: Not on file    Gets together: Not on file    Attends religious service: Not on file    Active member of club or organization: Not on file    Attends meetings of clubs or organizations: Not on file    Relationship status: Not on file  . Intimate partner violence:    Fear of current or ex partner: Not on file    Emotionally abused: Not on file    Physically abused: Not on file    Forced sexual activity: Not on file  Other Topics Concern  . Not on file  Social History Narrative   Divorced/Single. 2 chldren. 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 BD PEN NEEDLE NANO U/F 32G X 4 MM MISC USE ONCE DAILY 100 each 2  . budesonide-formoterol (SYMBICORT) 80-4.5 MCG/ACT inhaler Inhale 2 puffs into the lungs 2 (two) times daily. 1 Inhaler 11  . Dulaglutide (TRULICITY) 1.5 ZO/1.0RU SOPN Inject 1.5 mg in a.m. under skin 4 pen 11  . glipiZIDE (GLUCOTROL XL) 5 MG 24 hr tablet TAKE 2 TABLETS BY MOUTH IN THE MORNING AND 2 TABLETS BEFORE DINNER 360 tablet 3  . Insulin Glargine (LANTUS SOLOSTAR) 100 UNIT/ML  Solostar Pen Inject 30 Units into the skin daily at 10 pm. 15 pen 4  . metFORMIN (GLUCOPHAGE) 1000 MG tablet Take 1 tablet (1,000 mg total) by mouth 2 (two) times daily with a meal. 180 tablet 3  . simvastatin (ZOCOR) 20 MG tablet Take 1 tablet (20 mg total) by mouth at bedtime. 90 tablet 3  . zolpidem (AMBIEN) 10 MG tablet Take 1 tablet (10 mg total) by mouth at bedtime as needed.  for sleep 30 tablet 5   No current facility-administered medications on file prior to visit.    Allergies  Allergen Reactions  . Iodine     Per the patient. Had an oral solution in the 90's that gave her a bad reaction of hallucinations.    Family History  Problem Relation Age of Onset  . Thyroid disease Mother   . Hyperlipidemia Mother   . Breast cancer Mother 23  . Skin cancer Father        not melanoma- passed at 11  . Colon cancer Maternal Uncle   . Stomach cancer Maternal Uncle   . Lung cancer Maternal Uncle   . Cancer Maternal Grandfather        unknown    Objective:   Physical Exam  BP 110/76 (BP Location: Right Arm, Patient Position: Sitting, Cuff Size: Normal)   Pulse 78   Temp 98.1 F (36.7 C) (Oral)   Ht 5\' 5"  (1.651 m)   Wt 165 lb (74.8 kg)   SpO2 97%   BMI 27.46 kg/m  Body mass index is 27.46 kg/m.  Wt Readings from Last 3 Encounters:  01/24/18 165 lb (74.8 kg)  09/20/17 164 lb (74.4 kg)  07/24/17 163 lb (73.9 kg)   Constitutional: overweight, in NAD Eyes: PERRLA, EOMI, no exophthalmos ENT: moist mucous membranes, no thyromegaly, no cervical lymphadenopathy Cardiovascular: RRR, No MRG Respiratory: CTA B Gastrointestinal: abdomen soft, NT, ND, BS+ Musculoskeletal: no deformities, strength intact in all 4 Skin: moist, warm, no rashes Neurological: no tremor with outstretched hands, DTR normal in all 4   Assessment:     1. DM2, insulin-dependent, uncontrolled, without long term complications, but with hyperglycemia  2. HL    Plan:     Pt with long-standing,  uncontrolled, type 2 diabetes, on basal insulin, GLP-1 receptor agonist and also oral medications.  At last visit, she had more hyperglycemic spikes and we discussed that we may need to check her for insulin deficiency.  At that time, HbA1c was slightly better at 6.5%, however, her sugars appeared worse so we moved her glipizide dose to 30 minutes before meals and I advised her to crush the glipizide before dinner.  We also increased her Lantus dose by 20%.  - At this visit, sugars are worse, especially after dinner and in the morning.  This may be due to her decreasing the dose of glipizide, however, for now, I advised her to stop the medication since she is still feeling weak and dizzy after dosing.  However, I advised her to increase metformin back to twice a day since I do not think that her symptoms were from metformin as she was tolerating this very well in the past.  She tells me that she felt like this before when her sugars were high, so it is possible she is reacting to the fact that the sugars increased.  I advised her that for now I would suggest to add mealtime insulin before dinner.  We will start with a low dose and advance as needed.  She reluctantly agrees with this.  Given coupon for NovoLog. - CBG the office today was 140 >> we will check her insulin production today and also check her anti-pancreatic antibodies - I advised her to: Patient Instructions  Please stop Glipizide.  Please increase Metformin to 1000 mg 2x a day with meals.  Continue: - Lantus  30 units after dinner  - Trulicity 1.5 mg weekly   Start: - NovoLog 6-8  units 15 min before dinner  Please return in 3 months with your sugar log.    - today, HbA1c is 7.6% (higher) - continue checking sugars at different times of the day - check 1-2x a day, rotating checks - advised for yearly eye exams >> she is not UTD - Return to clinic in 3 mo with sugar log    2. HL  - Reviewed latest lipid panel LDL MUCH better Lab  Results  Component Value Date   CHOL 147 05/24/2017   HDL 38.30 (L) 05/24/2017   LDLCALC 89 05/24/2017   LDLDIRECT 179.6 07/17/2013   TRIG 99.0 05/24/2017   CHOLHDL 4 05/24/2017  - Continues Lipitor without side effects.  Component     Latest Ref Rng & Units 01/24/2018  ZNT8 Antibodies     U/mL <15  Islet Cell Ab     Neg:<1:1 Negative  Glutamic Acid Decarb Ab     <5 IU/mL <5  C-Peptide     0.80 - 3.85 ng/mL 2.07  Glucose, Plasma     65 - 99 mg/dL 134 (H)   Labs indicate type II, rather than type 1 diabetes.  Philemon Kingdom, MD PhD [facility] Endocrinology

## 2018-01-27 LAB — GLUCOSE, FASTING: Glucose, Plasma: 134 mg/dL — ABNORMAL HIGH (ref 65–99)

## 2018-01-27 LAB — C-PEPTIDE: C PEPTIDE: 2.07 ng/mL (ref 0.80–3.85)

## 2018-01-27 LAB — GLUTAMIC ACID DECARBOXYLASE AUTO ABS

## 2018-01-27 MED ORDER — INSULIN LISPRO 100 UNIT/ML (KWIKPEN): PEN_INJECTOR | SUBCUTANEOUS | 11 refills | Status: DC

## 2018-01-29 DIAGNOSIS — Z808 Family history of malignant neoplasm of other organs or systems: Secondary | ICD-10-CM | POA: Diagnosis not present

## 2018-01-29 DIAGNOSIS — L814 Other melanin hyperpigmentation: Secondary | ICD-10-CM | POA: Diagnosis not present

## 2018-01-29 DIAGNOSIS — L821 Other seborrheic keratosis: Secondary | ICD-10-CM | POA: Diagnosis not present

## 2018-02-03 LAB — ANTI-ISLET CELL ANTIBODY: ISLET CELL AB: NEGATIVE

## 2018-02-03 LAB — ZNT8 ANTIBODIES

## 2018-02-19 DIAGNOSIS — Z6826 Body mass index (BMI) 26.0-26.9, adult: Secondary | ICD-10-CM | POA: Diagnosis not present

## 2018-02-19 DIAGNOSIS — Z01419 Encounter for gynecological examination (general) (routine) without abnormal findings: Secondary | ICD-10-CM | POA: Diagnosis not present

## 2018-02-19 DIAGNOSIS — Z1231 Encounter for screening mammogram for malignant neoplasm of breast: Secondary | ICD-10-CM | POA: Diagnosis not present

## 2018-02-19 LAB — HM PAP SMEAR: HM Pap smear: NEGATIVE

## 2018-02-20 ENCOUNTER — Encounter: Payer: Self-pay | Admitting: Family Medicine

## 2018-05-02 ENCOUNTER — Encounter: Payer: Self-pay | Admitting: Internal Medicine

## 2018-05-02 ENCOUNTER — Ambulatory Visit (INDEPENDENT_AMBULATORY_CARE_PROVIDER_SITE_OTHER): Payer: Commercial Managed Care - PPO | Admitting: Internal Medicine

## 2018-05-02 VITALS — BP 120/82 | HR 82 | Ht 65.0 in | Wt 162.0 lb

## 2018-05-02 DIAGNOSIS — E119 Type 2 diabetes mellitus without complications: Secondary | ICD-10-CM | POA: Diagnosis not present

## 2018-05-02 DIAGNOSIS — E785 Hyperlipidemia, unspecified: Secondary | ICD-10-CM | POA: Diagnosis not present

## 2018-05-02 LAB — POCT GLYCOSYLATED HEMOGLOBIN (HGB A1C): HEMOGLOBIN A1C: 6.8 % — AB (ref 4.0–5.6)

## 2018-05-02 MED ORDER — INSULIN PEN NEEDLE 32G X 4 MM MISC: 3 refills | Status: DC

## 2018-05-02 NOTE — Patient Instructions (Addendum)
Please continue:  - Metformin 1000 mg 2x a day with meals - Trulicity 1.5 mg weekly  - Lantus 30 units after dinner  - Humalog 6-8 units 15 min before dinner (may increase to 10 units)  When injecting insulin: Inject in the abdomen or upper thighs Rotate the injection sites around the belly button Change needle for each injection Keep needle in for 10 sec after last unit of insulin in Release the plunger only after needle is out Keep the insulin in use out of the fridge  Please return in 3-4 months with your sugar log.

## 2018-05-02 NOTE — Progress Notes (Signed)
Subjective:     Patient ID: Denise Beard, female   DOB: [birth year], 57 y.o.   MRN: [id number]  Diabetes    Denise Beard is a pleasant 57 y.o. woman, returning for f/u for DM2, dx 2012, insulin-dependent, uncontrolled, without long-term complications. Last visit 3 months ago.  Before last visit, she developed dizziness especially after taking glipizide.  She backed off this medication and also backed off the metformin to see if this contributed to her dizziness.  Sugars were higher and an HbA1c was 7.6%, increased from 6.5%.  Reviewed latest HbA1c levels: Lab Results  Component Value Date   HGBA1C 7.6 (A) 01/24/2018   HGBA1C 6.5 09/20/2017   HGBA1C 7.0 (H) 05/24/2017   At last visit, we checked her for type 1 diabetes but the investigation was negative: Component     Latest Ref Rng & Units 01/24/2018  ZNT8 Antibodies     U/mL <15  Islet Cell Ab     Neg:<1:1 Negative  Glutamic Acid Decarb Ab     <5 IU/mL <5  C-Peptide     0.80 - 3.85 ng/mL 2.07  Glucose, Plasma     65 - 99 mg/dL 134 (H)   She is now on:   - Lantus 25 >> 30 units after dinner - Trulicity 1.5 mg weekly  - Metformin 1000 mg at night >> 1000 mg 2x a day with meals - Humalog 6 to 8 units before dinner-started 01/2018 Due to dizziness, we stopped glipizide completely at last visit She was on saxagliptin/metformin XR 09/998 mg (Kombyglyze) in the past. She was on JanuMet 50/1000 bid in 2015 >> lightheaded, HA, blurry vision.  She checks once a day-improved: - am: 156-204, 224 >> 92, 135, 173-244, 259 >> 150-175 - 2h after b'fast: n/c >> 135-200, 239, 256 >> n/c - before lunch 1 n/c >> 176 >> n/c >> 271 >> n/c >> 120-130 - 2h after lunch: 112, 129 >> 103 >> n/c >> 94 >> n/c - before dinner:90-163 >> 92-126 >> 164 >> 102-160, 179 >> 120-130 - 2h after dinner: 98-211 >> 145-237, 243 >> 140-234 >> 180-190 - bedtime: 145-241 >> n/c >> 132-196 >> n/c - nighttime: 109-188 Lowest: 90 - felt this. Highest: 259 >> 200s -  Mtn dew  Meter:  One Touch ultra 2  She had nutrition education in the past.  -No CKD; latest BUN/creatinine: Lab Results  Component Value Date   BUN 12 05/24/2017   CREATININE 0.67 05/24/2017   No MAU: Lab Results  Component Value Date   MICRALBCREAT 0.9 05/24/2017   MICRALBCREAT 0.8 04/06/2016   MICRALBCREAT 2.4 02/04/2015   MICRALBCREAT 0.4 10/30/2013   MICRALBCREAT 18.2 07/17/2013   MICRALBCREAT 1.9 10/06/2012   MICRALBCREAT 1.5 07/15/2012   MICRALBCREAT 1.4 02/26/2011   -+ HL: Lipids:  Lab Results  Component Value Date   CHOL 147 05/24/2017   HDL 38.30 (L) 05/24/2017   LDLCALC 89 05/24/2017   LDLDIRECT 179.6 07/17/2013   TRIG 99.0 05/24/2017   CHOLHDL 4 05/24/2017  On Zocor.  - Last eye exam 11/2016: No DR (Dr Macarthur Critchley [facility]) - No numbness or tingling in her feet.  She also has a history of anxiety; asthma; GERD; constipation; polyarthropathy; seizure disorder.   Review of Systems Constitutional: no weight gain/no weight loss, no fatigue, no subjective hyperthermia, no subjective hypothermia Eyes: no blurry vision, no xerophthalmia ENT: no sore throat, no nodules palpated in neck, no dysphagia, no odynophagia, no hoarseness Cardiovascular:  no CP/no SOB/no palpitations/no leg swelling Respiratory: no cough/no SOB/no wheezing Gastrointestinal: no N/no V/no D/no C/no acid reflux Musculoskeletal: no muscle aches/no joint aches Skin: no rashes, no hair loss Neurological: no tremors/no numbness/no tingling/no dizziness  I reviewed pt's medications, allergies, PMH, social hx, family hx, and changes were documented in the history of present illness. Otherwise, unchanged from my initial visit note.  Past Medical History:  Diagnosis Date  . ANXIETY 03/17/2007   in past  . ASTHMA 06/26/2007  . DIAB W/O COMP TYPE II/UNS NOT STATED UNCNTRL 04/06/2009  . GERD 06/26/2007  . Irritable bowel syndrome 02/27/2010   constipation  . OA (osteoarthritis)  12/13/2009  . SEIZURE DISORDER 04/17/2010   in past /over 4 years ago/no meds. actually vasovagal per neuro notes   Past Surgical History:  Procedure Laterality Date  . ABDOMINAL HYSTERECTOMY    . CESAREAN SECTION    . TUBAL LIGATION     Social History   Socioeconomic History  . Marital status: Divorced    Spouse name: Not on file  . Number of children: Not on file  . Years of education: Not on file  . Highest education level: Not on file  Occupational History  . Not on file  Social Needs  . Financial resource strain: Not on file  . Food insecurity:    Worry: Not on file    Inability: Not on file  . Transportation needs:    Medical: Not on file    Non-medical: Not on file  Tobacco Use  . Smoking status: Never Smoker  . Smokeless tobacco: Never Used  Substance and Sexual Activity  . Alcohol use: No  . Drug use: No  . Sexual activity: Not on file  Lifestyle  . Physical activity:    Days per week: Not on file    Minutes per session: Not on file  . Stress: Not on file  Relationships  . Social connections:    Talks on phone: Not on file    Gets together: Not on file    Attends religious service: Not on file    Active member of club or organization: Not on file    Attends meetings of clubs or organizations: Not on file    Relationship status: Not on file  . Intimate partner violence:    Fear of current or ex partner: Not on file    Emotionally abused: Not on file    Physically abused: Not on file    Forced sexual activity: Not on file  Other Topics Concern  . Not on file  Social History Narrative   Divorced/Single. 2 chldren. 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 BD PEN NEEDLE NANO U/F 32G X 4 MM MISC USE ONCE DAILY 100 each 2  .  budesonide-formoterol (SYMBICORT) 80-4.5 MCG/ACT inhaler Inhale 2 puffs into the lungs 2 (two) times daily. 1 Inhaler 11  . Dulaglutide (TRULICITY) 1.5 XK/4.8JE SOPN Inject 1.5 mg in a.m. under skin 4 pen 11  . glipiZIDE (GLUCOTROL XL) 5 MG 24 hr tablet TAKE 2 TABLETS BY MOUTH IN THE MORNING AND 2 TABLETS BEFORE DINNER 360 tablet 3  . Insulin Glargine (LANTUS SOLOSTAR) 100 UNIT/ML Solostar Pen Inject 30 Units  into the skin daily at 10 pm. 15 pen 4  . insulin lispro (HUMALOG KWIKPEN) 100 UNIT/ML KiwkPen Route: Inject 6-8 Units into the skin daily with supper 15 mL 11  . metFORMIN (GLUCOPHAGE) 1000 MG tablet Take 1 tablet (1,000 mg total) by mouth 2 (two) times daily with a meal. 180 tablet 3  . simvastatin (ZOCOR) 20 MG tablet Take 1 tablet (20 mg total) by mouth at bedtime. 90 tablet 3  . zolpidem (AMBIEN) 10 MG tablet Take 1 tablet (10 mg total) by mouth at bedtime as needed. for sleep 30 tablet 5   No current facility-administered medications on file prior to visit.    Allergies  Allergen Reactions  . Iodine     Per the patient. Had an oral solution in the 90's that gave her a bad reaction of hallucinations.    Family History  Problem Relation Age of Onset  . Thyroid disease Mother   . Hyperlipidemia Mother   . Breast cancer Mother 65  . Skin cancer Father        not melanoma- passed at 69  . Colon cancer Maternal Uncle   . Stomach cancer Maternal Uncle   . Lung cancer Maternal Uncle   . Cancer Maternal Grandfather        unknown    Objective:   Physical Exam  BP 120/82   Pulse 82   Ht 5\' 5"  (1.651 m) Comment: measured  Wt 162 lb (73.5 kg)   SpO2 98%   BMI 26.96 kg/m  Body mass index is 26.96 kg/m.  Wt Readings from Last 3 Encounters:  05/02/18 162 lb (73.5 kg)  01/24/18 165 lb (74.8 kg)  09/20/17 164 lb (74.4 kg)   Constitutional: overweight, in NAD Eyes: PERRLA, EOMI, no exophthalmos ENT: moist mucous membranes, no thyromegaly, no cervical  lymphadenopathy Cardiovascular: RRR, No MRG Respiratory: CTA B Gastrointestinal: abdomen soft, NT, ND, BS+ Musculoskeletal: no deformities, strength intact in all 4 Skin: moist, warm, no rashes Neurological: no tremor with outstretched hands, DTR normal in all 4   Assessment:     1. DM2, insulin-dependent, uncontrolled, without long term complications, but with hyperglycemia  2. HL  3.  Overweight    Plan:     Pt with longstanding, uncontrolled, type 2 diabetes, on basal insulin, GLP-1 receptor agonist and metformin, to which we added Humalog with dinner at last visit.  She was on glipizide before but she developed dizziness from it so she decrease the dose before last visit..  At last visit, she was also on a lower dose of metformin and her sugars were higher.  Due to dizziness, we stopped glipizide completely, but we added rapid acting insulin to cover her dinner.  At that time, we also checked her for type 1 diabetes, but the investigation was negative. -At this visit, sugars are much better after she started Humalog and increase the metformin. -However, upon questioning, she is not doing the Humalog injections correctly.  She is not keeping the needle in for at least 6 seconds after doing the injection and she releases the plunger while the needle is still in.  She is also not rotating the injection sites enough.  We discussed about how to do the injections correctly.  As of now, sugars are still high after dinner but I would want her to start taking the insulin correctly and then increase the dose to 10 units if still needed afterwards.  No other changes are needed in her regimen. -  I advised her to: Patient Instructions  Please continue:  - Metformin 1000 mg 2x a day with meals - Trulicity 1.5 mg weekly  - Lantus 30 units after dinner  - Humalog 6-8 units 15 min before dinner (may increase to 10 units)  When injecting insulin: Inject in the abdomen or upper thighs Rotate the  injection sites around the belly button Change needle for each injection Keep needle in for 10 sec after last unit of insulin in Release the plunger only after needle is out Keep the insulin in use out of the fridge  Please return in 3-4 months with your sugar log.   - today, HbA1c is 6.8% (better) - continue checking sugars at different times of the day - check 1-2x a day, rotating checks - advised for yearly eye exams >> she is due - Return to clinic in 3 mo with sugar log     2. HL  - Reviewed latest lipid panel from 05/2017: LDL at goal, much improved from 2015, HDL slightly low Lab Results  Component Value Date   CHOL 147 05/24/2017   HDL 38.30 (L) 05/24/2017   LDLCALC 89 05/24/2017   LDLDIRECT 179.6 07/17/2013   TRIG 99.0 05/24/2017   CHOLHDL 4 05/24/2017  - Continues Lipitor without side effects.  3.  Overweight -Weight has decreased by 3 pounds since last visit -Continue Trulicity which should also help with weight loss  Philemon Kingdom, MD PhD [facility] Endocrinology

## 2018-05-15 ENCOUNTER — Other Ambulatory Visit: Payer: Self-pay | Admitting: Family Medicine

## 2018-05-27 ENCOUNTER — Encounter: Payer: Self-pay | Admitting: Family Medicine

## 2018-05-27 ENCOUNTER — Ambulatory Visit: Payer: Commercial Managed Care - PPO | Admitting: Family Medicine

## 2018-05-27 VITALS — BP 130/86 | HR 78 | Temp 98.3°F | Ht 65.0 in | Wt 163.0 lb

## 2018-05-27 DIAGNOSIS — M159 Polyosteoarthritis, unspecified: Secondary | ICD-10-CM

## 2018-05-27 DIAGNOSIS — E119 Type 2 diabetes mellitus without complications: Secondary | ICD-10-CM | POA: Diagnosis not present

## 2018-05-27 DIAGNOSIS — M25542 Pain in joints of left hand: Secondary | ICD-10-CM

## 2018-05-27 DIAGNOSIS — M25541 Pain in joints of right hand: Secondary | ICD-10-CM | POA: Diagnosis not present

## 2018-05-27 DIAGNOSIS — E785 Hyperlipidemia, unspecified: Secondary | ICD-10-CM | POA: Diagnosis not present

## 2018-05-27 DIAGNOSIS — M791 Myalgia, unspecified site: Secondary | ICD-10-CM

## 2018-05-27 NOTE — Assessment & Plan Note (Signed)
Patient doing well-follows with Dr. Dominica Severin on medications as above- we updated her diabetic foot exam and urine microalbumin test today

## 2018-05-27 NOTE — Progress Notes (Signed)
Subjective:  Denise Beard is a 58 y.o. year old very pleasant female patient who presents for/with See problem oriented charting ROS- no fever or chills. Some stiffness but doesn't improve with activity. Complains of diffuse myalgias and arthralgias   Past Medical History-  Patient Active Problem List   Diagnosis Date Noted  . Type II diabetes mellitus, well controlled ([place]) 04/06/2009    Priority: High  . Hyperlipidemia 02/04/2015    Priority: Medium  . Insomnia 02/04/2015    Priority: Medium  . Asthma 06/26/2007    Priority: Medium  . Osteoarthritis 02/04/2015    Priority: Low  . Vasovagal syncope 02/04/2015    Priority: Low  . Allergic rhinitis 02/04/2015    Priority: Low  . Irritable bowel syndrome 02/27/2010    Priority: Low  . GERD 06/26/2007    Priority: Low    Medications- reviewed and updated Current Outpatient Medications  Medication Sig Dispense Refill  . budesonide-formoterol (SYMBICORT) 80-4.5 MCG/ACT inhaler Inhale 2 puffs into the lungs 2 (two) times daily. 1 Inhaler 11  . Dulaglutide (TRULICITY) 1.5 NG/2.9BM SOPN Inject 1.5 mg in a.m. under skin 4 pen 11  . Insulin Glargine (LANTUS SOLOSTAR) 100 UNIT/ML Solostar Pen Inject 30 Units into the skin daily at 10 pm. 15 pen 4  . insulin lispro (HUMALOG KWIKPEN) 100 UNIT/ML KiwkPen Route: Inject 6-8 Units into the skin daily with supper 15 mL 11  . Insulin Pen Needle (BD PEN NEEDLE NANO U/F) 32G X 4 MM MISC USE 2x DAILY - for injecting insulin 200 each 3  . metFORMIN (GLUCOPHAGE) 1000 MG tablet Take 1 tablet (1,000 mg total) by mouth 2 (two) times daily with a meal. 180 tablet 3  . simvastatin (ZOCOR) 20 MG tablet Take 1 tablet (20 mg total) by mouth at bedtime. 90 tablet 3  . zolpidem (AMBIEN) 10 MG tablet TAKE 1 TABLET BY MOUTH EVERY DAY AT BEDTIME AS NEEDED FOR SLEEP 30 tablet 0   No current facility-administered medications for this visit.     Objective: BP 130/86 (BP Location: Left Arm, Patient Position:  Sitting, Cuff Size: Large)   Pulse 78   Temp 98.3 F (36.8 C) (Oral)   Ht '5\' 5"'$  (1.651 m)   Wt 163 lb (73.9 kg)   SpO2 98%   BMI 27.12 kg/m  Gen: NAD, resting comfortably CV: RRR no murmurs rubs or gallops Lungs: CTAB no crackles, wheeze, rhonchi Ext: no edema Skin: warm, dry  MSK: No joint swelling. Stands slowly and antalgic gait Patient tender to palpation in most joints- spares elbows. She is tender in most joints of the hand, shoulders, knees, tender along vertebrae of spine, knees, ankles. She also has muscular tenderness in lower legs, upper legs, lower arms, upper arms, mid, low, and upper back. Denies neck pain or facial pain.    Diabetic Foot Exam - Simple   Simple Foot Form Diabetic Foot exam was performed with the following findings:  Yes 05/27/2018  2:49 PM  Visual Inspection No deformities, no ulcerations, no other skin breakdown bilaterally:  Yes Sensation Testing Intact to touch and monofilament testing bilaterally:  Yes Pulse Check Posterior Tibialis and Dorsalis pulse intact bilaterally:  Yes Comments     Assessment/Plan:  Osteoarthritis But now with both arthralgias and myalgias  s: Patient complains of joint pain- both shoulders, both wrists, both knees. Also over Christmas great toe hurt. Ankles also hurt. Also hurts in some muscle groups like upper arms, upper back. States hands and shoulders  most severe.   Pain is worse at night. Mild morning stiffness but no prolonged stiffness such as an hour. Started noticing symptoms 6 months ago. Reports has had flare ups in the past like this- has tried tylenol arthritis and gives some relief but last few weeks have been more intense and having trouble walking even due to pain.   From chart overview from 3 years ago noted "knees, hands, lumbar spine. Aleve sparingly" under osteoarthritis. Has avoided ibuprofen/aleve/motri but not for particular reason.   Not sleeping well due to pain. More fatigued than normal.  Thinking clearly. Wilburn Cornelia for sleep at baseline but not as effetive lately  Mother with fibromyalgia as well as rheumatoid arthritis  Father died Aug 13, 2016- was helping him and twisted left knee- left knee certainly more severe than the right A/P: 58 year old female with rather diffuse pain with both myalgias and arthralgias without obvious joint swelling-unclear cause on initial evaluation and need to investigate further.  I doubt osteoarthritis is causing all this given her myalgias.  Mother does have fibromyalgia and I wonder about that as the cause -Will obtain esr, crp, ana, cbc, cmp, tsh.  Also getting lipid panel given upcoming physical -We will trial 1 week hold of simvastatin 20 mg though I doubt this is the cause -We discussed potential trial of meloxicam or prednisone (A1c is controlled at 6.8 as of a month ago so I think this is reasonable )depending on lab findings.  I would lean toward Mobic- has taken this in the past with relief -We also discussed possible referral to dermatology depending on findings.  I doubt rheumatoid arthritis but with her his mother's history if had lab abnormalities I would want to get their opinion  Type II diabetes mellitus, well controlled [facility]) Patient doing well-follows with Dr. Dominica Severin on medications as above- we updated her diabetic foot exam and urine microalbumin test today  Future Appointments  Date Time Provider [place]  07/17/2018  3:40 PM Marin Olp, MD [facility]  07/18/2018  8:30 AM [facility]  09/12/2018  4:15 PM Philemon Kingdom, MD [facility] None   No follow-ups on file.  Lab/Order associations: water only today- is fasting Type II diabetes mellitus, well controlled ([place]) - Plan: Microalbumin / creatinine urine ratio  Hyperlipidemia, unspecified hyperlipidemia type - Plan: CBC, Comprehensive metabolic panel, Lipid panel, TSH  Myalgia - Plan: Sedimentation rate, C-reactive protein, Antinuclear Antib  (ANA), Rheumatoid Factor  Arthralgia of both hands - Plan: Sedimentation rate, C-reactive protein, Antinuclear Antib (ANA), Rheumatoid Factor  Osteoarthritis of multiple joints, unspecified osteoarthritis type  Return precautions advised.  [name], MD

## 2018-05-27 NOTE — Patient Instructions (Addendum)
Health Maintenance Due  Topic Date Due  . OPHTHALMOLOGY EXAM -will call and schedule 12/07/2017  . FOOT EXAM -completed today 05/24/2018  . URINE MICROALBUMIN -ordered today 05/24/2018   Please stop by lab before you go  I want you to hold your simvastatin for up to a week to see if it helps your symptoms at all- if it does not please restart  May need to refer to rheumatology potentially if bloodwork points in that direction. I also may use a course of prednisone or meloxicam depending on findings. Labs are going to take a few days to get them all back I believe

## 2018-05-27 NOTE — Assessment & Plan Note (Addendum)
But now with both arthralgias and myalgias  s: Patient complains of joint pain- both shoulders, both wrists, both knees. Also over Christmas great toe hurt. Ankles also hurt. Also hurts in some muscle groups like upper arms, upper back. States hands and shoulders most severe.   Pain is worse at night. Mild morning stiffness but no prolonged stiffness such as an hour. Started noticing symptoms 6 months ago. Reports has had flare ups in the past like this- has tried tylenol arthritis and gives some relief but last few weeks have been more intense and having trouble walking even due to pain.   From chart overview from 3 years ago noted "knees, hands, lumbar spine. Aleve sparingly" under osteoarthritis. Has avoided ibuprofen/aleve/motri but not for particular reason.   Not sleeping well due to pain. More fatigued than normal. Thinking clearly. Wilburn Cornelia for sleep at baseline but not as effetive lately  Mother with fibromyalgia as well as rheumatoid arthritis  Father died 2016-08-11- was helping him and twisted left knee- left knee certainly more severe than the right A/P: 58 year old female with rather diffuse pain with both myalgias and arthralgias without obvious joint swelling-unclear cause on initial evaluation and need to investigate further.  I doubt osteoarthritis is causing all this given her myalgias.  Mother does have fibromyalgia and I wonder about that as the cause -Will obtain esr, crp, ana, cbc, cmp, tsh.  Also getting lipid panel given upcoming physical -We will trial 1 week hold of simvastatin 20 mg though I doubt this is the cause -We discussed potential trial of meloxicam or prednisone (A1c is controlled at 6.8 as of a month ago so I think this is reasonable )depending on lab findings.  I would lean toward Mobic- has taken this in the past with relief -We also discussed possible referral to dermatology depending on findings.  I doubt rheumatoid arthritis but with her his mother's history  if had lab abnormalities I would want to get their opinion

## 2018-05-28 LAB — COMPREHENSIVE METABOLIC PANEL
ALT: 12 U/L (ref 0–35)
AST: 13 U/L (ref 0–37)
Albumin: 4.2 g/dL (ref 3.5–5.2)
Alkaline Phosphatase: 94 U/L (ref 39–117)
BUN: 9 mg/dL (ref 6–23)
CHLORIDE: 103 meq/L (ref 96–112)
CO2: 28 mEq/L (ref 19–32)
Calcium: 10.4 mg/dL (ref 8.4–10.5)
Creatinine, Ser: 0.61 mg/dL (ref 0.40–1.20)
GFR: 130.48 mL/min (ref >60.00)
Glucose, Bld: 103 mg/dL — ABNORMAL HIGH (ref 70–99)
Potassium: 4.2 mEq/L (ref 3.5–5.1)
Sodium: 140 mEq/L (ref 135–145)
Total Bilirubin: 0.4 mg/dL (ref 0.2–1.2)
Total Protein: 7.5 g/dL (ref 6.0–8.3)

## 2018-05-28 LAB — C-REACTIVE PROTEIN: CRP: 0.2 mg/dL — ABNORMAL LOW (ref 0.5–20.0)

## 2018-05-28 LAB — LIPID PANEL
Cholesterol: 170 mg/dL (ref 0–200)
HDL: 45.7 mg/dL (ref >39.00)
LDL CALC: 102 mg/dL — AB (ref 0–99)
NonHDL: 124.1
Total CHOL/HDL Ratio: 4
Triglycerides: 112 mg/dL (ref 0.0–149.0)
VLDL: 22.4 mg/dL (ref 0.0–40.0)

## 2018-05-28 LAB — TSH: TSH: 1.21 u[IU]/mL (ref 0.35–4.50)

## 2018-05-28 LAB — SEDIMENTATION RATE: Sed Rate: 38 mm/hr — ABNORMAL HIGH (ref 0–30)

## 2018-05-28 LAB — CBC
HCT: 38.9 % (ref 36.0–46.0)
Hemoglobin: 12.8 g/dL (ref 12.0–15.0)
MCHC: 33 g/dL (ref 30.0–36.0)
MCV: 90.1 fl (ref 78.0–100.0)
PLATELETS: 348 10*3/uL (ref 150.0–400.0)
RBC: 4.32 Mil/uL (ref 3.87–5.11)
RDW: 13.1 % (ref 11.5–15.5)
WBC: 9.1 10*3/uL (ref 4.0–10.5)

## 2018-05-28 NOTE — Addendum Note (Signed)
Addended by: Kayren Eaves T on: 05/28/2018 11:17 AM   Modules accepted: Orders

## 2018-05-29 ENCOUNTER — Other Ambulatory Visit: Payer: Self-pay

## 2018-05-29 LAB — RHEUMATOID FACTOR: Rheumatoid fact SerPl-aCnc: <14 IU/mL (ref <14)

## 2018-05-29 LAB — ANA: Anti Nuclear Antibody(ANA): NEGATIVE

## 2018-05-29 LAB — MICROALBUMIN / CREATININE URINE RATIO
Creatinine, Urine: 97 mg/dL (ref 20–275)
Microalb Creat Ratio: 8 mcg/mg creat (ref <30)
Microalb, Ur: 0.8 mg/dL

## 2018-05-29 MED ORDER — MELOXICAM 15 MG PO TABS: 15.0000 mg | ORAL_TABLET | Freq: Every day | ORAL | 0 refills | Status: DC

## 2018-06-14 ENCOUNTER — Other Ambulatory Visit: Payer: Self-pay | Admitting: Family Medicine

## 2018-07-14 ENCOUNTER — Telehealth: Payer: Self-pay | Admitting: Radiology

## 2018-07-14 NOTE — Telephone Encounter (Signed)
Please place future labs for Pts lab appt 07/18/2018. Thanks!

## 2018-07-14 NOTE — Telephone Encounter (Signed)
LAB appt has been canceled. Will speak to pt about labs at [facility] 07/17/2018

## 2018-07-17 ENCOUNTER — Encounter: Payer: Commercial Managed Care - PPO | Admitting: Family Medicine

## 2018-07-17 ENCOUNTER — Other Ambulatory Visit: Payer: Self-pay | Admitting: Internal Medicine

## 2018-07-18 ENCOUNTER — Other Ambulatory Visit: Payer: Commercial Managed Care - PPO

## 2018-07-31 ENCOUNTER — Telehealth: Payer: Self-pay | Admitting: Family Medicine

## 2018-07-31 ENCOUNTER — Ambulatory Visit (INDEPENDENT_AMBULATORY_CARE_PROVIDER_SITE_OTHER): Payer: Commercial Managed Care - PPO | Admitting: Physician Assistant

## 2018-07-31 ENCOUNTER — Other Ambulatory Visit: Payer: Self-pay

## 2018-07-31 ENCOUNTER — Encounter: Payer: Self-pay | Admitting: Physician Assistant

## 2018-07-31 VITALS — BP 130/74 | HR 85 | Temp 98.3°F | Ht 65.0 in | Wt 163.2 lb

## 2018-07-31 DIAGNOSIS — R05 Cough: Secondary | ICD-10-CM

## 2018-07-31 DIAGNOSIS — R059 Cough, unspecified: Secondary | ICD-10-CM

## 2018-07-31 LAB — POC INFLUENZA A&B (BINAX/QUICKVUE)
INFLUENZA B, POC: NEGATIVE
Influenza A, POC: NEGATIVE

## 2018-07-31 LAB — POCT RAPID STREP A (OFFICE): Rapid Strep A Screen: NEGATIVE

## 2018-07-31 NOTE — Telephone Encounter (Signed)
See note

## 2018-07-31 NOTE — Telephone Encounter (Signed)
Copied from [address] [id number]. Topic: Quick Communication - Rx Refill/Question >> Jul 31, 2018  2:56 PM Margot Ables wrote: Medication: budesonide-formoterol (SYMBICORT) 80-4.5 MCG/ACT inhaler  - pt was in today and advised to use her symbicort. She thought she had another one at home but does not. Please send in order.   Preferred Pharmacy (with phone number or street name): [id number] - [place], [place] [phone] (Phone) [phone] (Fax)

## 2018-07-31 NOTE — Progress Notes (Signed)
Denise Beard is a 58 y.o. female here for an acute visit.  History of Present Illness:   Lonell Grandchild, CMA acting as Education administrator for [employer], [place].    Cough  This is a new problem. The current episode started in the past 7 days. The problem has been unchanged. The problem occurs constantly. The cough is non-productive. Associated symptoms include headaches and postnasal drip. Pertinent negatives include no ear pain or fever. She has tried nothing for the symptoms. The treatment provided no relief. Her past medical history is significant for asthma. There is no history of COPD.    Patient is a diabetic. States blood sugars this morning were 171, which is normal for her.  She is asthmatic. She is NOT currently using her symbicort.  PMHx, SurgHx, SocialHx, Medications, and Allergies were reviewed in the Visit Navigator and updated as appropriate.  Current Medications   Current Outpatient Medications:  .  budesonide-formoterol (SYMBICORT) 80-4.5 MCG/ACT inhaler, Inhale 2 puffs into the lungs 2 (two) times daily., Disp: 1 Inhaler, Rfl: 11 .  Dulaglutide (TRULICITY) 1.5 QV/9.5GL SOPN, Inject 1.5 mg in a.m. under skin, Disp: 4 pen, Rfl: 11 .  Insulin Glargine (LANTUS SOLOSTAR) 100 UNIT/ML Solostar Pen, Inject 30 Units into the skin daily at 10 pm., Disp: 15 pen, Rfl: 4 .  insulin lispro (HUMALOG KWIKPEN) 100 UNIT/ML KiwkPen, Route: Inject 6-8 Units into the skin daily with supper, Disp: 15 mL, Rfl: 11 .  Insulin Pen Needle 32G X 4 MM MISC, USE ONCE DAILY, Disp: 100 each, Rfl: 2 .  meloxicam (MOBIC) 15 MG tablet, Take 1 tablet (15 mg total) by mouth daily., Disp: 10 tablet, Rfl: 0 .  simvastatin (ZOCOR) 20 MG tablet, Take 1 tablet (20 mg total) by mouth at bedtime., Disp: 90 tablet, Rfl: 3 .  zolpidem (AMBIEN) 10 MG tablet, TAKE 1 TABLET BY MOUTH EVERY DAY AT BEDTIME AS NEEDED, Disp: 30 tablet, Rfl: 5   Allergies  Allergen Reactions  . Iodine     Per the patient. Had an oral  solution in the 90's that gave her a bad reaction of hallucinations.    Review of Systems   Pertinent items are noted in the HPI. Otherwise, ROS is negative.  Vitals   Vitals:   07/31/18 1011  BP: 130/74  Pulse: 85  Temp: 98.3 F (36.8 C)  TempSrc: Oral  SpO2: 98%  Weight: 163 lb 3.2 oz (74 kg)  Height: 5\' 5"  (1.651 m)     Body mass index is 27.16 kg/m.  Physical Exam   Physical Exam Vitals signs and nursing note reviewed.  Constitutional:      General: She is not in acute distress.    Appearance: She is well-developed. She is not ill-appearing or toxic-appearing.  HENT:     Head: Normocephalic and atraumatic.     Right Ear: Tympanic membrane, ear canal and external ear normal. Tympanic membrane is not erythematous, retracted or bulging.     Left Ear: Tympanic membrane, ear canal and external ear normal. Tympanic membrane is not erythematous, retracted or bulging.     Nose: Congestion present.     Right Sinus: No maxillary sinus tenderness or frontal sinus tenderness.     Left Sinus: No maxillary sinus tenderness or frontal sinus tenderness.     Mouth/Throat:     Lips: Pink.     Mouth: Mucous membranes are moist.     Pharynx: Uvula midline. Posterior oropharyngeal erythema present.  Eyes:  General: Lids are normal.     Conjunctiva/sclera: Conjunctivae normal.  Neck:     Trachea: Trachea normal.  Cardiovascular:     Rate and Rhythm: Normal rate and regular rhythm.     Heart sounds: Normal heart sounds, S1 normal and S2 normal.  Pulmonary:     Effort: Pulmonary effort is normal.     Breath sounds: Normal breath sounds. No decreased breath sounds, wheezing, rhonchi or rales.  Lymphadenopathy:     Cervical: No cervical adenopathy.  Skin:    General: Skin is warm and dry.  Neurological:     Mental Status: She is alert.  Psychiatric:        Speech: Speech normal.        Behavior: Behavior normal. Behavior is cooperative.      Results for orders placed or  performed in visit on 07/31/18  POCT rapid strep A  Result Value Ref Range   Rapid Strep A Screen Negative Negative  POC Influenza A&B(BINAX/QUICKVUE)  Result Value Ref Range   Influenza A, POC Negative Negative   Influenza B, POC Negative Negative    Assessment and Plan   Mayo was seen today for cough.  Diagnoses and all orders for this visit:  Cough -     POCT rapid strep A -     POC Influenza A&B(BINAX/QUICKVUE)   No red flags on exam.  Strep and flu negative. Suspect viral illness. I did recommend resuming symbicort BID. Start Mucinex DM as well. Reviewed return precautions including worsening fever, SOB, worsening cough or other concerns. Push fluids and rest. I recommend that patient follow-up if symptoms worsen or persist despite treatment x 7-10 days, sooner if needed.   . Reviewed expectations re: course of current medical issues. . Discussed self-management of symptoms. . Outlined signs and symptoms indicating need for more acute intervention. . Patient verbalized understanding and all questions were answered. Marland Kitchen Health Maintenance issues including appropriate healthy diet, exercise, and smoking avoidance were discussed with patient. . See orders for this visit as documented in the electronic medical record. . Patient received an After Visit Summary.   CMA served as Education administrator during this visit. History, Physical, and Plan performed by medical provider. The above documentation has been reviewed and is accurate and complete. [place], [place], [place] 07/31/2018

## 2018-08-01 ENCOUNTER — Other Ambulatory Visit: Payer: Self-pay

## 2018-08-01 MED ORDER — BUDESONIDE-FORMOTEROL FUMARATE 80-4.5 MCG/ACT IN AERO: 2.0000 | INHALATION_SPRAY | Freq: Two times a day (BID) | RESPIRATORY_TRACT | 11 refills | Status: DC

## 2018-08-01 NOTE — Telephone Encounter (Signed)
Refill for Symbicort sent to pharmacy

## 2018-08-11 ENCOUNTER — Other Ambulatory Visit: Payer: Self-pay | Admitting: Family Medicine

## 2018-08-19 ENCOUNTER — Telehealth: Payer: Self-pay | Admitting: Family Medicine

## 2018-08-19 NOTE — Telephone Encounter (Signed)
Patient needs physical before October 20, 2018. I moved the appointment from August 23, 2018 to Oct 17, 2018 due to being asked to move appointments out 3 months for physicals however, the patient would like to know if she can still come in sooner than May due to insurance purposes. Contact patient to advise.

## 2018-08-20 NOTE — Telephone Encounter (Signed)
Spoke to pt and advised that due to Covid-19 we are pushing appt. Out. I asked pt if the May 29th appt. Didn't work for her since it is before June. Pt stated that it may be "pushing it'  I advise pt to call insurance company to see if they can be flexible with the time frame due to Covid-19. Pt verbalized understanding. Pt stated she will keep her May appt. and if anything changes she will call back.

## 2018-08-22 ENCOUNTER — Encounter: Payer: Commercial Managed Care - PPO | Admitting: Family Medicine

## 2018-09-12 ENCOUNTER — Other Ambulatory Visit: Payer: Self-pay

## 2018-09-12 ENCOUNTER — Ambulatory Visit (INDEPENDENT_AMBULATORY_CARE_PROVIDER_SITE_OTHER): Payer: Commercial Managed Care - PPO | Admitting: Internal Medicine

## 2018-09-12 ENCOUNTER — Encounter: Payer: Self-pay | Admitting: Internal Medicine

## 2018-09-12 DIAGNOSIS — E785 Hyperlipidemia, unspecified: Secondary | ICD-10-CM

## 2018-09-12 DIAGNOSIS — E119 Type 2 diabetes mellitus without complications: Secondary | ICD-10-CM

## 2018-09-12 DIAGNOSIS — E663 Overweight: Secondary | ICD-10-CM | POA: Diagnosis not present

## 2018-09-12 MED ORDER — INSULIN LISPRO (1 UNIT DIAL) 100 UNIT/ML (KWIKPEN): 10.0000 [IU] | PEN_INJECTOR | Freq: Every day | SUBCUTANEOUS | 5 refills | Status: DC

## 2018-09-12 MED ORDER — DULAGLUTIDE 1.5 MG/0.5ML ~~LOC~~ SOAJ: SUBCUTANEOUS | 11 refills | Status: DC

## 2018-09-12 MED ORDER — METFORMIN HCL 1000 MG PO TABS: 1000.0000 mg | ORAL_TABLET | Freq: Two times a day (BID) | ORAL | 3 refills | Status: DC

## 2018-09-12 MED ORDER — INSULIN GLARGINE 100 UNIT/ML SOLOSTAR PEN: 30.0000 [IU] | PEN_INJECTOR | Freq: Every day | SUBCUTANEOUS | 4 refills | Status: DC

## 2018-09-12 NOTE — Progress Notes (Signed)
Subjective:     Patient ID: Denise Beard, female   DOB: 08-Oct-1960, 58 y.o.   MRN: [id number]  Patient location: Home My location: Office  Referring Provider: Marin Olp, MD  I connected with the patient on 09/12/18 at  10:30 am EDT by a video enabled telemedicine application and verified that I am speaking with the correct person.   I discussed the limitations of evaluation and management by telemedicine and the availability of in person appointments. The patient expressed understanding and agreed to proceed.   Details of the encounter are shown below.  Diabetes    Denise Beard is a pleasant 58 y.o. woman, returning for f/u for DM2, dx 2012, insulin-dependent, uncontrolled, without long-term complications. Last visit 4 months ago.  Reviewed latest HbA1c levels: Lab Results  Component Value Date   HGBA1C 6.8 (A) 05/02/2018   HGBA1C 7.6 (A) 01/24/2018   HGBA1C 6.5 09/20/2017   Type 1 diabetes investigation was negative: Component     Latest Ref Rng & Units 01/24/2018  ZNT8 Antibodies     U/mL <15  Islet Cell Ab     Neg:<1:1 Negative  Glutamic Acid Decarb Ab     <5 IU/mL <5  C-Peptide     0.80 - 3.85 ng/mL 2.07  Glucose, Plasma     65 - 99 mg/dL 134 (H)   She is now on:  - Metformin 1000 mg 2x a day with meals - Trulicity 1.5 mg weekly  - Lantus 30 units after dinner  - Humalog 8-10 units 15 minutes before dinner -started 01/2018 We stopped glipizide due to dizziness. She was on saxagliptin/metformin XR 09/998 mg (Kombyglyze) in the past. She was on JanuMet 50/1000 bid in 2015 >> lightheaded, HA, blurry vision.  She checks once a day: - am: 92, 135, 173-244, 259 >> 150-175 >> 122-163, 215 (no Lantus) - 2h after b'fast: 135-200, 239, 256 >> n/c - before lunch: 271 >> n/c >> 120-130 >> 117-133, 143 - 2h after lunch: 103 >> n/c >> 94 >> n/c - before dinner: 102-160, 179 >> 120-130 >> 69 (?), 97-143 - 2h after dinner:  140-234 >> 180-190 >> 116-127, 165 -  bedtime:  n/c >> 132-196 >> n/c - nighttime: 109-188 Lowest: 90 - felt this >> 69. Highest: 259 >> 200s - Mtn dew >> 215.  Meter:  One Touch ultra 2  She had nutrition education in the past.  -CKD; latest BUN/creatinine: Lab Results  Component Value Date   BUN 9 05/27/2018   CREATININE 0.61 05/27/2018   No MAU: Lab Results  Component Value Date   MICRALBCREAT 8 05/27/2018   MICRALBCREAT 0.9 05/24/2017   MICRALBCREAT 0.8 04/06/2016   MICRALBCREAT 2.4 02/04/2015   MICRALBCREAT 0.4 10/30/2013   MICRALBCREAT 18.2 07/17/2013   MICRALBCREAT 1.9 10/06/2012   MICRALBCREAT 1.5 07/15/2012   MICRALBCREAT 1.4 02/26/2011   -+ HL: Lipids:  Lab Results  Component Value Date   CHOL 170 05/27/2018   HDL 45.70 05/27/2018   LDLCALC 102 (H) 05/27/2018   LDLDIRECT 179.6 07/17/2013   TRIG 112.0 05/27/2018   CHOLHDL 4 05/27/2018  On Zocor.  - Last eye exam 11/2017: No DR (Dr Macarthur Critchley [facility]) - No numbness or tingling in her feet.  She also has a history of anxiety, asthma, GERD, constipation; polyarthropathy; seizure disorder.   Review of Systems Constitutional: no weight gain/no weight loss, no fatigue, no subjective hyperthermia, no subjective hypothermia Eyes: no blurry vision, no xerophthalmia ENT:  no sore throat, no nodules palpated in neck, no dysphagia, no odynophagia, no hoarseness Cardiovascular: no CP/no SOB/no palpitations/no leg swelling Respiratory: no cough/no SOB/no wheezing Gastrointestinal: no N/no V/no D/no C/no acid reflux Musculoskeletal: no muscle aches/no joint aches Skin: no rashes, no hair loss Neurological: no tremors/no numbness/no tingling/no dizziness  I reviewed pt's medications, allergies, PMH, social hx, family hx, and changes were documented in the history of present illness. Otherwise, unchanged from my initial visit note.  Past Medical History:  Diagnosis Date  . ANXIETY 03/17/2007   in past  . ASTHMA 06/26/2007  . DIAB W/O  COMP TYPE II/UNS NOT STATED UNCNTRL 04/06/2009  . GERD 06/26/2007  . Irritable bowel syndrome 02/27/2010   constipation  . OA (osteoarthritis) 12/13/2009  . SEIZURE DISORDER 04/17/2010   in past /over 4 years ago/no meds. actually vasovagal per neuro notes   Past Surgical History:  Procedure Laterality Date  . ABDOMINAL HYSTERECTOMY    . CESAREAN SECTION    . TUBAL LIGATION     Social History   Socioeconomic History  . Marital status: Divorced    Spouse name: Not on file  . Number of children: Not on file  . Years of education: Not on file  . Highest education level: Not on file  Occupational History  . Not on file  Social Needs  . Financial resource strain: Not on file  . Food insecurity:    Worry: Not on file    Inability: Not on file  . Transportation needs:    Medical: Not on file    Non-medical: Not on file  Tobacco Use  . Smoking status: Never Smoker  . Smokeless tobacco: Never Used  Substance and Sexual Activity  . Alcohol use: No  . Drug use: No  . Sexual activity: Not on file  Lifestyle  . Physical activity:    Days per week: Not on file    Minutes per session: Not on file  . Stress: Not on file  Relationships  . Social connections:    Talks on phone: Not on file    Gets together: Not on file    Attends religious service: Not on file    Active member of club or organization: Not on file    Attends meetings of clubs or organizations: Not on file    Relationship status: Not on file  . Intimate partner violence:    Fear of current or ex partner: Not on file    Emotionally abused: Not on file    Physically abused: Not on file    Forced sexual activity: Not on file  Other Topics Concern  . Not on file  Social History Narrative   Divorced/Single. 2 chldren. 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 budesonide-formoterol (SYMBICORT) 80-4.5 MCG/ACT inhaler Inhale 2 puffs into the lungs 2 (two) times daily. 1 Inhaler 11  . Dulaglutide (TRULICITY) 1.5 IP/3.8SN SOPN Inject 1.5 mg in a.m. under skin 4 pen 11  . Insulin Glargine (LANTUS SOLOSTAR) 100 UNIT/ML Solostar Pen Inject 30 Units into the skin daily at 10 pm. 15 pen 4  . insulin lispro (HUMALOG KWIKPEN) 100 UNIT/ML KiwkPen Route: Inject 6-8 Units into the skin daily with supper  15 mL 11  . Insulin Pen Needle 32G X 4 MM MISC USE ONCE DAILY 100 each 2  . meloxicam (MOBIC) 15 MG tablet Take 1 tablet (15 mg total) by mouth daily. 10 tablet 0  . simvastatin (ZOCOR) 20 MG tablet TAKE 1 TABLET BY MOUTH EVERYDAY AT BEDTIME 90 tablet 3  . zolpidem (AMBIEN) 10 MG tablet TAKE 1 TABLET BY MOUTH EVERY DAY AT BEDTIME AS NEEDED 30 tablet 5   No current facility-administered medications on file prior to visit.    Allergies  Allergen Reactions  . Iodine     Per the patient. Had an oral solution in the 90's that gave her a bad reaction of hallucinations.    Family History  Problem Relation Age of Onset  . Thyroid disease Mother   . Hyperlipidemia Mother   . Breast cancer Mother 50  . Skin cancer Father        not melanoma- passed at 59  . Colon cancer Maternal Uncle   . Stomach cancer Maternal Uncle   . Lung cancer Maternal Uncle   . Cancer Maternal Grandfather        unknown    Objective:   Physical Exam  There were no vitals taken for this visit. There is no height or weight on file to calculate BMI.  Wt Readings from Last 3 Encounters:  07/31/18 163 lb 3.2 oz (74 kg)  05/27/18 163 lb (73.9 kg)  05/02/18 162 lb (73.5 kg)   Constitutional:  in NAD  The physical exam was not performed (virtual visit).   Assessment:     1. DM2, insulin-dependent, uncontrolled, without long term complications, but with hyperglycemia  2. HL  3.  Overweight    Plan:     Pt  with longstanding, uncontrolled, type 2 diabetes, on basal insulin, GLP-1 receptor agonist, and metformin, and now also on Humalog with dinner, added last fall.  She was having higher blood sugars after dinner, which are now much improved, at goal, after increasing the dose of Humalog.  Sugars in the morning also improved, but they are still above goal.  Upon questioning, she tells me that she is snacking after dinner and I believe that this is the reason for hyperglycemia in the morning.  We discussed about eliminating or changing some of the snacks.  She usually has dinner around 6 and goes to bed at 9-10 pm.  - I do not feel that otherwise she needs a change in her regimen for now. - I advised her to: Patient Instructions  Please continue: - Metformin 1000 mg 2x a day with meals - Trulicity 1.5 mg weekly  - Lantus 30 units after dinner  - Humalog 10 units 15 minutes before dinner  Try to eliminate snacks after dinner.  Please return in 4 months with your sugar log.   - We will check her HbA1c when she returns to the clinic - continue checking sugars at different times of the day - check 2x a day, rotating checks - advised for yearly eye exams >> she is not UTD - I refilled her DM medications - Return to clinic in 4 mo with sugar log   2. HL  - Reviewed latest lipid panel from 05/2018: LDL slightly above goal Lab Results  Component Value Date   CHOL 170 05/27/2018   HDL 45.70 05/27/2018   LDLCALC 102 (H) 05/27/2018   LDLDIRECT 179.6 07/17/2013   TRIG 112.0 05/27/2018   CHOLHDL 4 05/27/2018  -  Continues Zocor without side effects  3.  Overweight -Continue Trulicity which should also help with weight loss - lost 2 lbs since last OV  Philemon Kingdom, MD PhD [facility]

## 2018-09-12 NOTE — Patient Instructions (Addendum)
Please continue: - Metformin 1000 mg 2x a day with meals - Trulicity 1.5 mg weekly  - Lantus 30 units after dinner  - Humalog 10 units 15 minutes before dinner  Try to eliminate snacks after dinner.  Please return in 4 months with your sugar log.

## 2018-10-17 ENCOUNTER — Ambulatory Visit (INDEPENDENT_AMBULATORY_CARE_PROVIDER_SITE_OTHER): Payer: Commercial Managed Care - PPO | Admitting: Family Medicine

## 2018-10-17 ENCOUNTER — Other Ambulatory Visit: Payer: Self-pay

## 2018-10-17 ENCOUNTER — Encounter: Payer: Self-pay | Admitting: Family Medicine

## 2018-10-17 VITALS — BP 118/80 | HR 80 | Temp 98.9°F | Ht 65.0 in | Wt 166.0 lb

## 2018-10-17 DIAGNOSIS — K589 Irritable bowel syndrome without diarrhea: Secondary | ICD-10-CM

## 2018-10-17 DIAGNOSIS — Z0000 Encounter for general adult medical examination without abnormal findings: Secondary | ICD-10-CM

## 2018-10-17 DIAGNOSIS — G4700 Insomnia, unspecified: Secondary | ICD-10-CM

## 2018-10-17 DIAGNOSIS — Z23 Encounter for immunization: Secondary | ICD-10-CM

## 2018-10-17 DIAGNOSIS — E119 Type 2 diabetes mellitus without complications: Secondary | ICD-10-CM

## 2018-10-17 DIAGNOSIS — J4540 Moderate persistent asthma, uncomplicated: Secondary | ICD-10-CM

## 2018-10-17 DIAGNOSIS — D649 Anemia, unspecified: Secondary | ICD-10-CM

## 2018-10-17 DIAGNOSIS — M159 Polyosteoarthritis, unspecified: Secondary | ICD-10-CM

## 2018-10-17 DIAGNOSIS — K219 Gastro-esophageal reflux disease without esophagitis: Secondary | ICD-10-CM

## 2018-10-17 DIAGNOSIS — E663 Overweight: Secondary | ICD-10-CM

## 2018-10-17 DIAGNOSIS — E785 Hyperlipidemia, unspecified: Secondary | ICD-10-CM | POA: Diagnosis not present

## 2018-10-17 LAB — COMPREHENSIVE METABOLIC PANEL
ALT: 12 U/L (ref 0–35)
AST: 13 U/L (ref 0–37)
Albumin: 3.8 g/dL (ref 3.5–5.2)
Alkaline Phosphatase: 71 U/L (ref 39–117)
BUN: 8 mg/dL (ref 6–23)
CO2: 26 mEq/L (ref 19–32)
Calcium: 9.2 mg/dL (ref 8.4–10.5)
Chloride: 107 mEq/L (ref 96–112)
Creatinine, Ser: 0.67 mg/dL (ref 0.40–1.20)
GFR: 110.02 mL/min (ref >60.00)
Glucose, Bld: 141 mg/dL — ABNORMAL HIGH (ref 70–99)
Potassium: 3.9 mEq/L (ref 3.5–5.1)
Sodium: 141 mEq/L (ref 135–145)
Total Bilirubin: 0.3 mg/dL (ref 0.2–1.2)
Total Protein: 6.9 g/dL (ref 6.0–8.3)

## 2018-10-17 LAB — CBC
HCT: 35.1 % — ABNORMAL LOW (ref 36.0–46.0)
Hemoglobin: 11.7 g/dL — ABNORMAL LOW (ref 12.0–15.0)
MCHC: 33.4 g/dL (ref 30.0–36.0)
MCV: 91 fl (ref 78.0–100.0)
Platelets: 259 10*3/uL (ref 150.0–400.0)
RBC: 3.85 Mil/uL — ABNORMAL LOW (ref 3.87–5.11)
RDW: 13.2 % (ref 11.5–15.5)
WBC: 8 10*3/uL (ref 4.0–10.5)

## 2018-10-17 LAB — LIPID PANEL
Cholesterol: 141 mg/dL (ref 0–200)
HDL: 38.1 mg/dL — ABNORMAL LOW (ref >39.00)
LDL Cholesterol: 76 mg/dL (ref 0–99)
NonHDL: 102.54
Total CHOL/HDL Ratio: 4
Triglycerides: 133 mg/dL (ref 0.0–149.0)
VLDL: 26.6 mg/dL (ref 0.0–40.0)

## 2018-10-17 LAB — HEMOGLOBIN A1C: Hgb A1c MFr Bld: 7.6 % — ABNORMAL HIGH (ref 4.6–6.5)

## 2018-10-17 MED ORDER — ZOLPIDEM TARTRATE 10 MG PO TABS: ORAL_TABLET | ORAL | 5 refills | Status: DC

## 2018-10-17 NOTE — Patient Instructions (Addendum)
Health Maintenance Due  Topic Date Due  . PNEUMOCOCCAL POLYSACCHARIDE VACCINE AGE 56-64 HIGH RISK - declined for now 05/30/1964  . TETANUS/TDAP - today 09/01/2017  . OPHTHALMOLOGY EXAM -  Please schedule this with Dr. Nicki Reaper 12/07/2017  - Discuss shingrix- with potential side effect of fever- im typically holding off right now   - Sign release of information at the check out desk for last pap smear and mammogram and bone density from [facility]  Please stop by lab before you go If you do not have mychart- we will call you about results within 5 business days of [place] receiving them.  If you have mychart- we will send your results within 3 business days of [place] receiving them.  If abnormal or we want to clarify a result, we will call or mychart you to make sure you receive the message.  If you have questions or concerns or don't hear within 5-7 days, please send [place] a message or call us.

## 2018-10-17 NOTE — Progress Notes (Signed)
Phone: [phone]   Subjective:  Patient presents today for their annual physical. Chief complaint-noted.   See problem oriented charting- ROS- full  review of systems was completed and negative No chest pain or shortness of breath. No headache or blurry vision. No fever, chills, cough,  body aches, sore throat, or loss of taste or smell   The following were reviewed and entered/updated in epic: Past Medical History:  Diagnosis Date  . ANXIETY 03/17/2007   in past  . ASTHMA 06/26/2007  . DIAB W/O COMP TYPE II/UNS NOT STATED UNCNTRL 04/06/2009  . GERD 06/26/2007  . Irritable bowel syndrome 02/27/2010   constipation  . OA (osteoarthritis) 12/13/2009  . SEIZURE DISORDER 04/17/2010   in past /over 4 years ago/no meds. actually vasovagal per neuro notes   Patient Active Problem List   Diagnosis Date Noted  . Type II diabetes mellitus, well controlled ([place]) 04/06/2009    Priority: High  . Hyperlipidemia 02/04/2015    Priority: Medium  . Insomnia 02/04/2015    Priority: Medium  . Asthma 06/26/2007    Priority: Medium  . Osteoarthritis 02/04/2015    Priority: Low  . Vasovagal syncope 02/04/2015    Priority: Low  . Allergic rhinitis 02/04/2015    Priority: Low  . Irritable bowel syndrome 02/27/2010    Priority: Low  . GERD 06/26/2007    Priority: Low   Past Surgical History:  Procedure Laterality Date  . ABDOMINAL HYSTERECTOMY    . CESAREAN SECTION    . TUBAL LIGATION      Family History  Problem Relation Age of Onset  . Thyroid disease Mother   . Hyperlipidemia Mother   . Breast cancer Mother 56  . Skin cancer Father        not melanoma- passed at 6  . Colon cancer Maternal Uncle   . Stomach cancer Maternal Uncle   . Lung cancer Maternal Uncle   . Cancer Maternal Grandfather        unknown    Medications- reviewed and updated Current Outpatient Medications  Medication Sig Dispense Refill  . budesonide-formoterol (SYMBICORT) 80-4.5 MCG/ACT inhaler Inhale 2  puffs into the lungs 2 (two) times daily. 1 Inhaler 11  . Dulaglutide (TRULICITY) 1.5 WU/8.8BV SOPN Inject 1.5 mg in a.m. under skin weekly 4 pen 11  . Insulin Glargine (LANTUS SOLOSTAR) 100 UNIT/ML Solostar Pen Inject 30 Units into the skin daily at 10 pm. 15 pen 4  . insulin lispro (HUMALOG KWIKPEN) 100 UNIT/ML KwikPen Inject 0.1 mLs (10 Units total) into the skin daily before supper. 15 mL 5  . Insulin Pen Needle 32G X 4 MM MISC USE ONCE DAILY 100 each 2  . meloxicam (MOBIC) 15 MG tablet Take 1 tablet (15 mg total) by mouth daily. 10 tablet 0  . metFORMIN (GLUCOPHAGE) 1000 MG tablet Take 1 tablet (1,000 mg total) by mouth 2 (two) times daily with a meal. 180 tablet 3  . simvastatin (ZOCOR) 20 MG tablet TAKE 1 TABLET BY MOUTH EVERYDAY AT BEDTIME 90 tablet 3  . zolpidem (AMBIEN) 10 MG tablet TAKE 1 TABLET BY MOUTH EVERY DAY AT BEDTIME AS NEEDED 30 tablet 5   No current facility-administered medications for this visit.     Allergies-reviewed and updated Allergies  Allergen Reactions  . Iodine     Per the patient. Had an oral solution in the 90's that gave her a bad reaction of hallucinations.     Social History   Social History Narrative  Divorced/Single. 2 chldren. 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 No rebound or guarding.  Ext: no edema Skin: warm, dry Neuro: grossly normal, moves all extremities, PERRLA   Assessment and Plan   58 y.o. female presenting for  annual physical.  Health Maintenance counseling: 1. Anticipatory guidance: Patient counseled regarding regular dental exams - yes has partial on top and scheduling for bottom when things safer from covid 19, eye exams - needs to schedule,  avoiding smoking and second hand smoke , limiting alcohol to 1 beverage per day- doesn't drink .   2. Risk factor reduction:  Advised patient of need for regular exercise and diet rich and fruits and vegetables to reduce risk of heart attack and stroke. Exercise- less active with covid 19- discussed some options for home exercise. Diet-some stress eating- feels like she could cut back on this. .  Weight stable in 160s over 20 years. Overweight BMI Wt Readings from Last 3 Encounters:  10/17/18 166 lb (75.3 kg)  07/31/18 163 lb 3.2 oz (74 kg)  05/27/18 163 lb (73.9 kg)  3. Immunizations/screenings/ancillary studies- Discuss shingrix- with potential side effect of fever- im typically holding off right now  Immunization History  Administered Date(s) Administered  . Influenza Whole 02/19/2011  . Influenza,inj,Quad PF,6+ Mos 05/24/2017  . Influenza-Unspecified 02/09/2015, 01/24/2016, 04/30/2018  . Pneumococcal Conjugate-13 11/10/2014  . Td 09/02/2007   Health Maintenance Due  Topic Date Due  . PNEUMOCOCCAL POLYSACCHARIDE VACCINE AGE 24-64 HIGH RISK - declines for now 05/30/1964  . TETANUS/TDAP - today  09/01/2017  . OPHTHALMOLOGY EXAM - needs to scheduled 12/07/2017  4. Cervical cancer screening- 01/03/17 with 3 year follow up on file with GYN - she had one last year 5. Breast cancer screening-  breast exam with GYN and mammogram 01/03/17 on file- will get updated exam 6. Colon cancer screening - 01/2015 with 10 year follow up  7. Skin cancer screening- saw dermatology and had good report last year- advised regular sunscreen use. Denies worrisome, changing, or new skin lesions.  8. Birth control/STD check- hysterectomy/not sexually active 9. Osteoporosis screening  at 87-  had one with GYN with no issues, consider repeat at 67 or getting copy of last records (trying for copy 10. never smoker  Status of chronic or acute concerns   # diabetes- follows with Dr. Cruzita Lederer- overdue for a1c- will update with labs. On trulicity, insulin long and short acting, metformin Lab Results  Component Value Date   HGBA1C 6.8 (A) 05/02/2018   #HLD- on simvastatin 20mg - update lipids  #insomnia- using ambien at night. No falls or driving issues- pretty much every night  #asthma- stable on symbicort- uses albuterol about once a week still  #OA- course of meloxicam was helpful when joints really flared up earlier this year- we will leave on med list for now in case recurrence.   No future appointments. No follow-ups  on file.  Lab/Order associations: fasting Preventative health care - Plan: CBC, Comprehensive metabolic panel, Lipid panel, Hemoglobin A1c  Type II diabetes mellitus, well controlled (H[facility]) - Plan: CBC, Comprehensive metabolic panel, Lipid panel, Hemoglobin A1c  Moderate persistent asthma without complication  Gastroesophageal reflux disease without esophagitis  Irritable bowel syndrome, unspecified type  Osteoarthritis of multiple joints, unspecified osteoarthritis type  Hyperlipidemia, unspecified hyperlipidemia type - Plan: CBC, Comprehensive metabolic panel, Lipid panel  Insomnia, unspecified type  No orders of the defined types were placed in this encounter.   Return precautions advised.  [name], MD

## 2018-10-17 NOTE — Addendum Note (Signed)
Addended by: Marin Olp on: 10/17/2018 08:32 AM   Modules accepted: Orders

## 2018-10-17 NOTE — Addendum Note (Signed)
Addended by: Darral Dash on: 10/17/2018 08:47 AM   Modules accepted: Orders

## 2019-02-19 ENCOUNTER — Other Ambulatory Visit: Payer: Self-pay

## 2019-02-19 DIAGNOSIS — Z20822 Contact with and (suspected) exposure to covid-19: Secondary | ICD-10-CM

## 2019-02-20 LAB — NOVEL CORONAVIRUS, NAA: SARS-CoV-2, NAA: NOT DETECTED

## 2019-03-24 ENCOUNTER — Ambulatory Visit: Payer: Commercial Managed Care - PPO | Admitting: Internal Medicine

## 2019-04-09 ENCOUNTER — Encounter: Payer: Self-pay | Admitting: Family Medicine

## 2019-04-09 DIAGNOSIS — F17200 Nicotine dependence, unspecified, uncomplicated: Secondary | ICD-10-CM | POA: Insufficient documentation

## 2019-04-13 ENCOUNTER — Other Ambulatory Visit: Payer: Self-pay | Admitting: Obstetrics and Gynecology

## 2019-04-13 DIAGNOSIS — R928 Other abnormal and inconclusive findings on diagnostic imaging of breast: Secondary | ICD-10-CM

## 2019-04-15 ENCOUNTER — Ambulatory Visit
Admission: RE | Admit: 2019-04-15 | Discharge: 2019-04-15 | Disposition: A | Discharge status: Home / Self Care | Payer: Commercial Managed Care - PPO | Source: Ambulatory Visit | Attending: Obstetrics and Gynecology | Admitting: Obstetrics and Gynecology

## 2019-04-15 ENCOUNTER — Other Ambulatory Visit: Payer: Self-pay

## 2019-04-15 DIAGNOSIS — R928 Other abnormal and inconclusive findings on diagnostic imaging of breast: Secondary | ICD-10-CM

## 2019-04-16 ENCOUNTER — Other Ambulatory Visit: Payer: Self-pay | Admitting: Internal Medicine

## 2019-04-21 ENCOUNTER — Other Ambulatory Visit: Payer: Self-pay

## 2019-04-23 ENCOUNTER — Ambulatory Visit (INDEPENDENT_AMBULATORY_CARE_PROVIDER_SITE_OTHER): Payer: Commercial Managed Care - PPO | Admitting: Internal Medicine

## 2019-04-23 ENCOUNTER — Encounter: Payer: Self-pay | Admitting: Internal Medicine

## 2019-04-23 ENCOUNTER — Other Ambulatory Visit: Payer: Self-pay

## 2019-04-23 VITALS — BP 110/60 | HR 74 | Ht 65.0 in | Wt 164.0 lb

## 2019-04-23 DIAGNOSIS — E119 Type 2 diabetes mellitus without complications: Secondary | ICD-10-CM

## 2019-04-23 DIAGNOSIS — E785 Hyperlipidemia, unspecified: Secondary | ICD-10-CM

## 2019-04-23 DIAGNOSIS — Z23 Encounter for immunization: Secondary | ICD-10-CM | POA: Diagnosis not present

## 2019-04-23 DIAGNOSIS — E663 Overweight: Secondary | ICD-10-CM

## 2019-04-23 LAB — POCT GLYCOSYLATED HEMOGLOBIN (HGB A1C): Hemoglobin A1C: 7.3 % — AB (ref 4.0–5.6)

## 2019-04-23 MED ORDER — TRULICITY 3 MG/0.5ML ~~LOC~~ SOAJ: 3.0000 mg | SUBCUTANEOUS | 3 refills | Status: DC

## 2019-04-23 NOTE — Patient Instructions (Signed)
Please continue: - Metformin 1000 mg 2x a day with meals - Lantus 30 units after dinner  - Humalog 10 units 15 minutes before dinner  Please increase: - Trulicity 3 mg weekly   Please return in 4 months with your sugar log.

## 2019-04-23 NOTE — Progress Notes (Signed)
Subjective:     Patient ID: Denise Beard, female   DOB: [birth year], 58 y.o.   MRN: [id number]  This visit occurred during the SARS-CoV-2 public health emergency.  Safety protocols were in place, including screening questions prior to the visit, additional usage of staff PPE, and extensive cleaning of exam room while observing appropriate contact time as indicated for disinfecting solutions.   Diabetes  Denise Beard is a pleasant 58 y.o. woman, returning for f/u for DM2, dx 2012, insulin-dependent, uncontrolled, without long-term complications. Last visit 8 months ago.  She is working from home.  Reviewed HbA1c levels: Lab Results  Component Value Date   HGBA1C 7.6 (H) 10/17/2018   HGBA1C 6.8 (A) 05/02/2018   HGBA1C 7.6 (A) 01/24/2018   She is on: - Metformin 1000 mg 2x a day with meals - Trulicity 1.5 mg weekly  - Lantus 30 units after dinner  - Humalog 10 units 15 minutes before dinner We stopped glipizide due to dizziness. She was on saxagliptin/metformin XR 09/998 mg (Kombyglyze) in the past. She was on JanuMet 50/1000 bid in 2015 >> lightheaded, HA, blurry vision.  She checks sugars once a day: - am: 92, 135, 173-244, 259 >> 150-175 >> 122-163, 215 >> 150-170 - 2h after b'fast: 135-200, 239, 256 >> n/c - before lunch: 271 >> n/c >> 120-130 >> 117-133, 143 >> n/c - 2h after lunch: 103 >> n/c >> 94 >> n/c - before dinner: 120-130 >> 69 (?), 97-143 >> n/c >> 125 - 2h after dinner:  140-234 >> 180-190 >> 116-127, 165 >> n/c - bedtime:  n/c >> 132-196 >> n/c - nighttime: 109-188 >> n/c Lowest: 90 - felt this >> 69 >> 125. Highest: 259 >> 200s - Mtn dew >> 215 >> 254 - [employer].  Meter:  One Touch ultra 2  She saw nutrition in the past.  No CKD; latest BUN/creatinine: Lab Results  Component Value Date   BUN 8 10/17/2018   CREATININE 0.67 10/17/2018   No MAU: Lab Results  Component Value Date   MICRALBCREAT 8 05/27/2018   MICRALBCREAT 0.9 05/24/2017   MICRALBCREAT  0.8 04/06/2016   MICRALBCREAT 2.4 02/04/2015   MICRALBCREAT 0.4 10/30/2013   MICRALBCREAT 18.2 07/17/2013   MICRALBCREAT 1.9 10/06/2012   MICRALBCREAT 1.5 07/15/2012   MICRALBCREAT 1.4 02/26/2011   + HL: Lipids:  Lab Results  Component Value Date   CHOL 141 10/17/2018   HDL 38.10 (L) 10/17/2018   LDLCALC 76 10/17/2018   LDLDIRECT 179.6 07/17/2013   TRIG 133.0 10/17/2018   CHOLHDL 4 10/17/2018  On Zocor.  - Last eye exam 11/2017: No DR (Dr Macarthur Critchley - [facility]). Has an appt today. -She denies numbness or tingling in her feet.  She also has a history of anxiety, asthma, GERD, constipation; polyarthropathy; seizure disorder.   Review of Systems Constitutional: no weight gain/no weight loss, no fatigue, no subjective hyperthermia, no subjective hypothermia Eyes: no blurry vision, no xerophthalmia ENT: no sore throat, no nodules palpated in neck, no dysphagia, no odynophagia, no hoarseness Cardiovascular: no CP/no SOB/no palpitations/no leg swelling Respiratory: no cough/no SOB/no wheezing Gastrointestinal: no N/no V/no D/no C/no acid reflux Musculoskeletal: no muscle aches/no joint aches Skin: no rashes, no hair loss Neurological: no tremors/no numbness/no tingling/no dizziness  I reviewed pt's medications, allergies, PMH, social hx, family hx, and changes were documented in the history of present illness. Otherwise, unchanged from my initial visit note.  Past Medical History:  Diagnosis Date  . ANXIETY  03/17/2007   in past  . ASTHMA 06/26/2007  . DIAB W/O COMP TYPE II/UNS NOT STATED UNCNTRL 04/06/2009  . GERD 06/26/2007  . Irritable bowel syndrome 02/27/2010   constipation  . OA (osteoarthritis) 12/13/2009  . SEIZURE DISORDER 04/17/2010   in past /over 4 years ago/no meds. actually vasovagal per neuro notes   Past Surgical History:  Procedure Laterality Date  . ABDOMINAL HYSTERECTOMY    . CESAREAN SECTION    . TUBAL LIGATION     Social History    Socioeconomic History  . Marital status: Divorced    Spouse name: Not on file  . Number of children: Not on file  . Years of education: Not on file  . Highest education level: Not on file  Occupational History  . Not on file  Social Needs  . Financial resource strain: Not on file  . Food insecurity    Worry: Not on file    Inability: Not on file  . Transportation needs    Medical: Not on file    Non-medical: Not on file  Tobacco Use  . Smoking status: Never Smoker  . Smokeless tobacco: Never Used  Substance and Sexual Activity  . Alcohol use: No  . Drug use: No  . Sexual activity: Not on file  Lifestyle  . Physical activity    Days per week: Not on file    Minutes per session: Not on file  . Stress: Not on file  Relationships  . Social Herbalist on phone: Not on file    Gets together: Not on file    Attends religious service: Not on file    Active member of club or organization: Not on file    Attends meetings of clubs or organizations: Not on file    Relationship status: Not on file  . Intimate partner violence    Fear of current or ex partner: Not on file    Emotionally abused: Not on file    Physically abused: Not on file    Forced sexual activity: Not on file  Other Topics Concern  . Not on file  Social History Narrative   Divorced/Single. 2 chldren. 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 budesonide-formoterol (SYMBICORT) 80-4.5 MCG/ACT inhaler Inhale 2 puffs into the lungs 2 (two) times daily. 1 Inhaler 11  . Dulaglutide (TRULICITY) 1.5 0000000 SOPN Inject 1.5 mg in a.m. under skin weekly 4 pen 11  . Insulin Glargine (LANTUS SOLOSTAR) 100 UNIT/ML Solostar Pen Inject 30 Units into the skin daily at 10 pm. 15 pen 4  . insulin  lispro (HUMALOG KWIKPEN) 100 UNIT/ML KwikPen Inject 0.1 mLs (10 Units total) into the skin daily before supper. 15 mL 5  . Insulin Pen Needle (BD PEN NEEDLE NANO U/F) 32G X 4 MM MISC USE ONCE DAILY AS DIRECTED 100 each 2  . meloxicam (MOBIC) 15 MG tablet Take 1 tablet (15 mg total) by mouth daily. 10 tablet 0  . metFORMIN (GLUCOPHAGE) 1000 MG tablet Take 1 tablet (1,000 mg total) by mouth 2 (two) times daily with a meal. 180 tablet 3  . simvastatin (ZOCOR) 20 MG tablet TAKE 1 TABLET BY MOUTH EVERYDAY AT BEDTIME 90 tablet  3  . zolpidem (AMBIEN) 10 MG tablet TAKE 1 TABLET BY MOUTH EVERY DAY AT BEDTIME AS NEEDED 30 tablet 5   No current facility-administered medications on file prior to visit.    Allergies  Allergen Reactions  . Iodine     Per the patient. Had an oral solution in the 90's that gave her a bad reaction of hallucinations.    Family History  Problem Relation Age of Onset  . Thyroid disease Mother   . Hyperlipidemia Mother   . Breast cancer Mother 13  . Skin cancer Father        not melanoma- passed at 63  . Colon cancer Maternal Uncle   . Stomach cancer Maternal Uncle   . Lung cancer Maternal Uncle   . Cancer Maternal Grandfather        unknown    Objective:   Physical Exam  BP 110/60   Pulse 74   Ht 5\' 5"  (1.651 m)   Wt 164 lb (74.4 kg)   SpO2 98%   BMI 27.29 kg/m  Body mass index is 27.29 kg/m.  Wt Readings from Last 3 Encounters:  04/23/19 164 lb (74.4 kg)  10/17/18 166 lb (75.3 kg)  07/31/18 163 lb 3.2 oz (74 kg)   Constitutional: overweight, in NAD Eyes: PERRLA, EOMI, no exophthalmos ENT: moist mucous membranes, no thyromegaly, no cervical lymphadenopathy Cardiovascular: RRR, No MRG Respiratory: CTA B Gastrointestinal: abdomen soft, NT, ND, BS+ Musculoskeletal: no deformities, strength intact in all 4 Skin: moist, warm, no rashes Neurological: no tremor with outstretched hands, DTR normal in all 4   Assessment:     1. DM2, insulin-dependent,  uncontrolled, without long term complications, but with hyperglycemia  Type 1 diabetes investigation was negative: Component     Latest Ref Rng & Units 01/24/2018  ZNT8 Antibodies     U/mL <15  Islet Cell Ab     Neg:<1:1 Negative  Glutamic Acid Decarb Ab     <5 IU/mL <5  C-Peptide     0.80 - 3.85 ng/mL 2.07  Glucose, Plasma     65 - 99 mg/dL 134 (H)   2. HL  3.  Overweight    Plan:     Pt with longstanding, uncontrolled, type 2 diabetes, on basal/bolus insulin regimen, Metformin, and weekly GLP-1 receptor agonist.  At last visit, she continued to have high blood sugars after dinner and we discussed about stopping snacks at night, but otherwise we did not change her regimen. - At this visit, sugars are high in am as she is still snacking at night >> discuss the need to stop - she is also not checking sugars later in the day >> advised to rotate checks - will increase Trulicity dose to 3 mg weekly for now but keep the rest of the regimen the same - I advised her to: Patient Instructions  Please continue: - Metformin 1000 mg 2x a day with meals - Lantus 30 units after dinner  - Humalog 10 units 15 minutes before dinner  Please increase: - Trulicity 3 mg weekly   Please return in 4 months with your sugar log.   -- we checked her HbA1c: 7.3% (better) - advised to check sugars at different times of the day - 1-2x a day, rotating check times - advised for yearly eye exams >> she is not UTD - return to clinic in 4 months  2. HL  -Regulated lipid panel from 09/2018: LDL at goal, HDL slightly low, triglycerides at  goal Lab Results  Component Value Date   CHOL 141 10/17/2018   HDL 38.10 (L) 10/17/2018   LDLCALC 76 10/17/2018   LDLDIRECT 179.6 07/17/2013   TRIG 133.0 10/17/2018   CHOLHDL 4 10/17/2018  -Continues Zocor without side effects  3.  Overweight -We will continue Trulicity which should also help with weight loss - will increase the dose -  no significant weight  loss since last visit  Philemon Kingdom, MD PhD [facility]

## 2019-04-23 NOTE — Addendum Note (Signed)
Addended by: Cardell Peach I on: 04/23/2019 02:58 PM   Modules accepted: Orders

## 2019-05-07 ENCOUNTER — Other Ambulatory Visit: Payer: Self-pay | Admitting: Family Medicine

## 2019-05-11 NOTE — Telephone Encounter (Signed)
Patient is scheduled for 12/24 for a virtual. She asked if she can get the refill prior to then as she is out. Please contact patient to advise.

## 2019-05-11 NOTE — Telephone Encounter (Signed)
Please advise, provider's next available appointment is not until Wednesday for a sameday slot.

## 2019-05-11 NOTE — Telephone Encounter (Signed)
Patient is calling back to check on the status of her medication refill. Patient states she has been out of medication since Thursday. Please advise

## 2019-05-11 NOTE — Telephone Encounter (Signed)
That is fine please call patient he can do as virtual

## 2019-05-11 NOTE — Telephone Encounter (Signed)
Pt called in to follow up on refill request for medication.  ° ° °Please assist.  °

## 2019-05-11 NOTE — Telephone Encounter (Signed)
See note

## 2019-05-14 ENCOUNTER — Encounter: Payer: Self-pay | Admitting: Family Medicine

## 2019-05-14 ENCOUNTER — Ambulatory Visit (INDEPENDENT_AMBULATORY_CARE_PROVIDER_SITE_OTHER): Payer: Commercial Managed Care - PPO | Admitting: Family Medicine

## 2019-05-14 VITALS — Ht 65.0 in | Wt 164.0 lb

## 2019-05-14 DIAGNOSIS — G4700 Insomnia, unspecified: Secondary | ICD-10-CM | POA: Diagnosis not present

## 2019-05-14 DIAGNOSIS — M25511 Pain in right shoulder: Secondary | ICD-10-CM | POA: Diagnosis not present

## 2019-05-14 DIAGNOSIS — G8929 Other chronic pain: Secondary | ICD-10-CM

## 2019-05-14 DIAGNOSIS — E1169 Type 2 diabetes mellitus with other specified complication: Secondary | ICD-10-CM | POA: Diagnosis not present

## 2019-05-14 DIAGNOSIS — E663 Overweight: Secondary | ICD-10-CM

## 2019-05-14 DIAGNOSIS — E785 Hyperlipidemia, unspecified: Secondary | ICD-10-CM

## 2019-05-14 MED ORDER — ZOLPIDEM TARTRATE 10 MG PO TABS: 10.0000 mg | ORAL_TABLET | Freq: Every evening | ORAL | 5 refills | Status: DC | PRN

## 2019-05-14 MED ORDER — SIMVASTATIN 20 MG PO TABS: ORAL_TABLET | ORAL | 3 refills | Status: DC

## 2019-05-14 NOTE — Progress Notes (Signed)
Phone [phone] Virtual visit via Video note   Subjective:  Chief complaint: Chief Complaint  Patient presents with  . virtual  . medication refill    This visit type was conducted due to national recommendations for restrictions regarding the COVID-19 Pandemic (e.g. social distancing).  This format is felt to be most appropriate for this patient at this time balancing risks to patient and risks to population by having him in for in person visit.  No physical exam was performed (except for noted visual exam or audio findings with Telehealth visits).    Our team/I connected with Denise Beard at  8:00 AM EST by a video enabled telemedicine application (doxy.me or caregility through epic) and verified that I am speaking with the correct person using two identifiers.  Location patient: [facility] Location provider: [facility], office Persons participating in the virtual visit:  patient  Our team/I discussed the limitations of evaluation and management by telemedicine and the availability of in person appointments. In light of current covid-19 pandemic, patient also understands that we are trying to protect them by minimizing in office contact if at all possible.  The patient expressed consent for telemedicine visit and agreed to proceed. Patient understands insurance will be billed.   ROS-  right shoulder pain, no shortness of breath, no chest pain, no neck pain with it  Past Medical History-  Patient Active Problem List   Diagnosis Date Noted  . Type II diabetes mellitus, well controlled ([place]) 04/06/2009    Priority: High  . Hyperlipidemia associated with type 2 diabetes mellitus ([place]) 02/04/2015    Priority: Medium  . Insomnia 02/04/2015    Priority: Medium  . Asthma 06/26/2007    Priority: Medium  . Osteoarthritis 02/04/2015    Priority: Low  . Vasovagal syncope 02/04/2015    Priority: Low  . Allergic rhinitis 02/04/2015    Priority: Low  . Irritable bowel syndrome  02/27/2010    Priority: Low  . GERD 06/26/2007    Priority: Low    Medications- reviewed and updated Current Outpatient Medications  Medication Sig Dispense Refill  . Dulaglutide (TRULICITY) 3 0000000 SOPN Inject 3 mg into the skin once a week. 12 pen 3  . Insulin Glargine (LANTUS SOLOSTAR) 100 UNIT/ML Solostar Pen Inject 30 Units into the skin daily at 10 pm. 15 pen 4  . insulin lispro (HUMALOG KWIKPEN) 100 UNIT/ML KwikPen Inject 0.1 mLs (10 Units total) into the skin daily before supper. 15 mL 5  . Insulin Pen Needle (BD PEN NEEDLE NANO U/F) 32G X 4 MM MISC USE ONCE DAILY AS DIRECTED 100 each 2  . metFORMIN (GLUCOPHAGE) 1000 MG tablet Take 1 tablet (1,000 mg total) by mouth 2 (two) times daily with a meal. 180 tablet 3  . simvastatin (ZOCOR) 20 MG tablet TAKE 1 TABLET BY MOUTH EVERYDAY AT BEDTIME 90 tablet 3  . zolpidem (AMBIEN) 10 MG tablet Take 1 tablet (10 mg total) by mouth at bedtime as needed for sleep. 30 tablet 5   No current facility-administered medications for this visit.     Objective:  Ht 5\' 5"  (1.651 m)   Wt 164 lb (74.4 kg)   BMI 27.29 kg/m No other self reported vitals Gen: NAD, resting comfortably Lungs: nonlabored, normal respiratory rate  Skin: appears dry, no obvious rash MSK: Pain with empty can testing and with pushoff from back against her chair    Assessment and Plan    # Insomnia S: Continues to get reasonable control  with ambien 10 mg. Has not had good results with 5 mg in the past. Shoulder affecting sleep some- but in total can still get 8 hours. Shoulder may wake up but she can get back to sleep  A/P: Reasonable control-refill Ambien today.  Discussed at age 58 to lower dose to 5 mg-she expresses understanding  #hyperlipidemia S: compliant with simvastatin 20mg  Lab Results  Component Value Date   CHOL 141 10/17/2018   HDL 38.10 (L) 10/17/2018   LDLCALC 76 10/17/2018   LDLDIRECT 179.6 07/17/2013   TRIG 133.0 10/17/2018   CHOLHDL 4  10/17/2018   A/P: Mild poor control with LDL at 76 and HDL at 38.  We discussed continuing weight loss efforts and also trying diet exercise to see if we can improve HDL.  I am hopeful with lifestyle changes we can get her to ideal goals  # Overweight S: weight actually down 2 lbs from last visit- has been doing some fasting for spiritual reasons but also helping with weight management. Not exercising much right now- encouraged to start. Wt Readings from Last 3 Encounters:  05/14/19 164 lb (74.4 kg)  04/23/19 164 lb (74.4 kg)  10/17/18 166 lb (75.3 kg)  A/P: Congratulated patient on slight weight loss and encouraged her to continue-discussed how this may help with diabetes and cholesterol issues -Encouraged need for healthy eating, regular exercise, weight loss.   # Right shoulder pain S:Going on for several months. Can affect sleep up to 7/10.  Worse with lifting arm overhead.  She has tried over-the-counter medicines A/P: Given pain has been going on for several months and can wake her up from sleep I told her I thought this would be best evaluated in person with either me or sports medicine-she agrees for sports medicine referral and this was placed today-encouraged patient to call us to schedule if she has not heard within 2 weeks.   Recommended follow up: Already scheduled for annual physical in June Future Appointments  Date Time Provider [place]  08/28/2019  4:00 PM Philemon Kingdom, MD [facility] None  10/23/2019  8:00 AM Yong Channel Brayton Mars, MD [facility]    Lab/Order associations:   ICD-10-CM   1. Chronic right shoulder pain  M25.511 Sports Medicine- [facility] Boxer   G89.29   2. Insomnia, unspecified type  G47.00   3. Hyperlipidemia associated with type 2 diabetes mellitus ([place])  E11.69    E78.5   4. Overweight  E66.3     Meds ordered this encounter  Medications  . simvastatin (ZOCOR) 20 MG tablet    Sig: TAKE 1 TABLET BY MOUTH EVERYDAY AT BEDTIME    Dispense:  90  tablet    Refill:  3  . zolpidem (AMBIEN) 10 MG tablet    Sig: Take 1 tablet (10 mg total) by mouth at bedtime as needed for sleep.    Dispense:  30 tablet    Refill:  5    This request is for a new prescription for a controlled substance as required by Federal/State law.    Return precautions advised.  [name], MD

## 2019-05-14 NOTE — Patient Instructions (Addendum)
Health Maintenance Due  Topic Date Due  . PNEUMOCOCCAL POLYSACCHARIDE VACCINE AGE 56-64 HIGH RISK -will do at next visit 05/30/1962  . MAMMOGRAM - 03/2019 01/04/2019  . URINE MICROALBUMIN -will discuss at next visit  05/28/2019

## 2019-06-02 ENCOUNTER — Ambulatory Visit (INDEPENDENT_AMBULATORY_CARE_PROVIDER_SITE_OTHER): Payer: Commercial Managed Care - PPO

## 2019-06-02 ENCOUNTER — Telehealth: Payer: Self-pay

## 2019-06-02 ENCOUNTER — Encounter: Payer: Self-pay | Admitting: Emergency Medicine

## 2019-06-02 ENCOUNTER — Other Ambulatory Visit: Payer: Self-pay

## 2019-06-02 ENCOUNTER — Ambulatory Visit
Admission: EM | Admit: 2019-06-02 | Discharge: 2019-06-02 | Disposition: A | Discharge status: Home / Self Care | Payer: Commercial Managed Care - PPO | Attending: Emergency Medicine | Admitting: Emergency Medicine

## 2019-06-02 DIAGNOSIS — M546 Pain in thoracic spine: Secondary | ICD-10-CM | POA: Diagnosis not present

## 2019-06-02 DIAGNOSIS — R05 Cough: Secondary | ICD-10-CM

## 2019-06-02 DIAGNOSIS — R059 Cough, unspecified: Secondary | ICD-10-CM

## 2019-06-02 DIAGNOSIS — Z20822 Contact with and (suspected) exposure to covid-19: Secondary | ICD-10-CM

## 2019-06-02 MED ORDER — CYCLOBENZAPRINE HCL 5 MG PO TABS: 5.0000 mg | ORAL_TABLET | Freq: Two times a day (BID) | ORAL | 0 refills | Status: AC | PRN

## 2019-06-02 NOTE — ED Triage Notes (Addendum)
Pt presents to [facility] for assessment of bilateral mid back pain starting Friday.  C/o "swimmy" feeling in her head, intermittent in nature.  Patient states she up'd the dose of her trulicity on Saturday, and isn't sure what role that plays.  States she has had a cough since November, but since Friday she has been having to force herself to take deep breaths.  C/o runny nose starting today.  C/o sore throat this weekend, but not today.  Denies n/v/d.  CBG today at 4pm was 254 after eating lunch at 2pm.

## 2019-06-02 NOTE — Telephone Encounter (Signed)
Thanks for update- urgent care is reasonable disposition.

## 2019-06-02 NOTE — ED Provider Notes (Signed)
[facility]    CSN: [id number] Arrival date & time: 06/02/19  1655      History   Chief Complaint Chief Complaint  Patient presents with  . Back Pain  . URI    HPI Denise Beard is a 59 y.o. female with history of OA, diabetes, asthma, GERD presenting for bilateral mid back pain and cough.  Patient states cough is chronic: Since November, but has worsened since Friday.  Patient feels she has difficulty taking full breath: Denies shortness of breath, wheezing, chest pain.  Patient does endorse rhinorrhea today with sore throat.  Has taken Tylenol with some relief.  Denies abdominal pain, nausea, vomiting.  Postprandial CBG today at home was 254.  Patient also noting spasms intrascapular (R>L), requesting refill of muscle relaxers which she tolerated well in the past.  Denies trauma to the area, neck pain, upper extremity paresthesias, weakness.   Past Medical History:  Diagnosis Date  . ANXIETY 03/17/2007   in past  . ASTHMA 06/26/2007  . DIAB W/O COMP TYPE II/UNS NOT STATED UNCNTRL 04/06/2009  . GERD 06/26/2007  . Irritable bowel syndrome 02/27/2010   constipation  . OA (osteoarthritis) 12/13/2009  . SEIZURE DISORDER 04/17/2010   in past /over 4 years ago/no meds. actually vasovagal per neuro notes    Patient Active Problem List   Diagnosis Date Noted  . Osteoarthritis 02/04/2015  . Vasovagal syncope 02/04/2015  . Hyperlipidemia associated with type 2 diabetes mellitus ([place]) 02/04/2015  . Allergic rhinitis 02/04/2015  . Insomnia 02/04/2015  . Irritable bowel syndrome 02/27/2010  . Type II diabetes mellitus, well controlled ([place]) 04/06/2009  . Asthma 06/26/2007  . GERD 06/26/2007    Past Surgical History:  Procedure Laterality Date  . ABDOMINAL HYSTERECTOMY    . CESAREAN SECTION    . TUBAL LIGATION      OB History   No obstetric history on file.      Home Medications    Prior to Admission medications   Medication Sig Start Date End Date Taking?  Authorizing Provider  cyclobenzaprine (FLEXERIL) 5 MG tablet Take 1 tablet (5 mg total) by mouth 2 (two) times daily as needed for up to 5 days for muscle spasms. 06/02/19 06/07/19  Hall-Potvin, [place], PA-C  Dulaglutide (TRULICITY) 3 0000000 SOPN Inject 3 mg into the skin once a week. 04/23/19   Philemon Kingdom, MD  Insulin Glargine (LANTUS SOLOSTAR) 100 UNIT/ML Solostar Pen Inject 30 Units into the skin daily at 10 pm. 09/12/18   Philemon Kingdom, MD  insulin lispro (HUMALOG KWIKPEN) 100 UNIT/ML KwikPen Inject 0.1 mLs (10 Units total) into the skin daily before supper. 09/12/18   Philemon Kingdom, MD  Insulin Pen Needle (BD PEN NEEDLE NANO U/F) 32G X 4 MM MISC USE ONCE DAILY AS DIRECTED 04/20/19   Philemon Kingdom, MD  metFORMIN (GLUCOPHAGE) 1000 MG tablet Take 1 tablet (1,000 mg total) by mouth 2 (two) times daily with a meal. 09/12/18   Philemon Kingdom, MD  simvastatin (ZOCOR) 20 MG tablet TAKE 1 TABLET BY MOUTH EVERYDAY AT BEDTIME 05/14/19   Marin Olp, MD  zolpidem (AMBIEN) 10 MG tablet Take 1 tablet (10 mg total) by mouth at bedtime as needed for sleep. 05/14/19   Marin Olp, MD    Family History Family History  Problem Relation Age of Onset  . Thyroid disease Mother   . Hyperlipidemia Mother   . Breast cancer Mother 27  . Skin cancer Father  not melanoma- passed at 67  . Colon cancer Maternal Uncle   . Stomach cancer Maternal Uncle   . Lung cancer Maternal Uncle   . Cancer Maternal Grandfather        unknown    Social History Social History   Tobacco Use  . Smoking status: Never Smoker  . Smokeless tobacco: Never Used  Substance Use Topics  . Alcohol use: No  . Drug use: No     Allergies   Iodine   Review of Systems As per HPI   Physical Exam Triage Vital Signs ED Triage Vitals [06/02/19 1712]  Enc Vitals Group     BP 130/86     Pulse Rate 89     Resp 16     Temp (!) 97.5 F (36.4 C)     Temp Source Temporal     SpO2 96 %      Weight      Height      Head Circumference      Peak Flow      Pain Score 5     Pain Loc      Pain Edu?      Excl. in [place]?    No data found.  Updated Vital Signs BP 130/86 (BP Location: Left Arm)   Pulse 89   Temp (!) 97.5 F (36.4 C) (Temporal)   Resp 16   SpO2 96%   Visual Acuity Right Eye Distance:   Left Eye Distance:   Bilateral Distance:    Right Eye Near:   Left Eye Near:    Bilateral Near:     Physical Exam Constitutional:      General: She is not in acute distress. HENT:     Head: Normocephalic and atraumatic.  Eyes:     General: No scleral icterus.    Pupils: Pupils are equal, round, and reactive to light.  Cardiovascular:     Rate and Rhythm: Normal rate.  Pulmonary:     Effort: Pulmonary effort is normal. No respiratory distress.     Breath sounds: No wheezing.  Musculoskeletal:        General: Normal range of motion.     Comments: Thoracic spine without deformity, spinous process tenderness.  No scapular spine tenderness.  Patient does have lumbar tenderness, firmness  Skin:    Capillary Refill: Capillary refill takes less than 2 seconds.     Coloration: Skin is not jaundiced or pale.  Neurological:     General: No focal deficit present.     Mental Status: She is alert and oriented to person, place, and time.      UC Treatments / Results  Labs (all labs ordered are listed, but only abnormal results are displayed) Labs Reviewed  NOVEL CORONAVIRUS, NAA   Narrative:    Performed at:  [address], [place], [place]  [id number] Lab Director: Rush Farmer MD, Phone:  [phone]    EKG   Radiology EXAM: CHEST - 2 VIEW COMPARISON:  06/26/2007 FINDINGS: Heart and mediastinal contours are within normal limits. No focal opacities or effusions. No acute bony abnormality. IMPRESSION: Normal study.  Electronically Signed   By: Rolm Baptise M.D.   On: 06/02/2019 18:09  Procedures Procedures (including critical  care time)  Medications Ordered in UC Medications - No data to display  Initial Impression / Assessment and Plan / UC Course  I have reviewed the triage vital signs and the nursing notes.  Pertinent labs &  imaging results that were available during my care of the patient were reviewed by me and considered in my medical decision making (see chart for details).     Patient afebrile, nontoxic in office today.  Chest x-ray done in office, reviewed by me radiology: Negative for opacities, effusions, infiltrations.  Reviewed findings with patient verbalized understanding and reassurance.  Follow-up with PCP regarding chronic issues including diabetic management and possible Trulicity adverse reaction.  Return precautions discussed, patient verbalized understanding and is agreeable to plan. Final Clinical Impressions(s) / UC Diagnoses   Final diagnoses:  Cough  Acute bilateral thoracic back pain     Discharge Instructions     Your COVID test is pending - it is important to quarantine / isolate at home until your results are back. If you test positive and would like further evaluation for persistent or worsening symptoms, you may schedule an E-visit or virtual (video) visit throughout the [facility] app or website.  PLEASE NOTE: If you develop severe chest pain or shortness of breath please go to the ER or call 9-1-1 for further evaluation --> DO NOT schedule electronic or virtual visits for this. Please call our office for further guidance / recommendations as needed.    ED Prescriptions    Medication Sig Dispense Auth. Provider   cyclobenzaprine (FLEXERIL) 5 MG tablet Take 1 tablet (5 mg total) by mouth 2 (two) times daily as needed for up to 5 days for muscle spasms. 10 tablet Hall-Potvin, [place], PA-C     PDMP not reviewed this encounter.   Hall-Potvin, [place], [place] 06/05/19 2005

## 2019-06-02 NOTE — Discharge Instructions (Signed)
Your COVID test is pending - it is important to quarantine / isolate at home until your results are back. °If you test positive and would like further evaluation for persistent or worsening symptoms, you may schedule an E-visit or virtual (video) visit throughout the [facility] MyChart app or website. ° °PLEASE NOTE: If you develop severe chest pain or shortness of breath please go to the ER or call 9-1-1 for further evaluation --> DO NOT schedule electronic or virtual visits for this. °Please call our office for further guidance / recommendations as needed. °

## 2019-06-02 NOTE — Telephone Encounter (Signed)
Patient did also mention that Dr. Renne Crigler recently increased the dosage of her Trulicity and wasn't sure if this had anything to do with her symptoms.

## 2019-06-02 NOTE — Telephone Encounter (Signed)
Received incoming call from patient who has been c/o SOB and pain in b/l shoulder blades that radiates into upper back x 4 days.  Patient states that she is also fatigued and has some slight congestion, as well as a chronic cough.  She reports that her cough was dry at first but is now productive (does report that she has recently increased her fluid intake and is taking Mucinex; doesn't know if that has contributed) She does also report c/o slight headache; pain in upper back feels like "pressure"   Initially, patient states that she was having some "tightness" in her chest which has resolved now.  She is also lightheaded.  She also reports that she has been covid tested back in October or November (she could not remember exactly) and was negative.  She denies any known contact with anyone that has tested positive or is suspected to have covid.  Given her symptoms, I advised patient to go to Urgent Care to be evaluated.  She verbalized understanding and is agreeable to this plan.  Routing to Dr. Yong Channel for review.

## 2019-06-03 LAB — NOVEL CORONAVIRUS, NAA: SARS-CoV-2, NAA: NOT DETECTED

## 2019-06-05 ENCOUNTER — Encounter: Payer: Self-pay | Admitting: Emergency Medicine

## 2019-06-12 ENCOUNTER — Encounter: Payer: Self-pay | Admitting: Family Medicine

## 2019-06-12 ENCOUNTER — Ambulatory Visit: Payer: Self-pay

## 2019-06-12 ENCOUNTER — Other Ambulatory Visit: Payer: Self-pay

## 2019-06-12 ENCOUNTER — Ambulatory Visit: Payer: Commercial Managed Care - PPO | Admitting: Family Medicine

## 2019-06-12 VITALS — BP 112/80 | HR 92 | Ht 65.0 in | Wt 164.0 lb

## 2019-06-12 DIAGNOSIS — M25511 Pain in right shoulder: Secondary | ICD-10-CM | POA: Diagnosis not present

## 2019-06-12 NOTE — Patient Instructions (Addendum)
You had a R shoulder injection today. Call or go to the ER if you develop a large red swollen joint with extreme pain or oozing puss.   Attend physical therapy.  Recheck with me in about 1 month.  Return sooner if needed.   I think the main issue is rotator cuff tendonitis.  You also have some evidence for proximal biceps tendonitis.  Frozen shoulder (adhesive capsulitis) is a possibility as well.

## 2019-06-12 NOTE — Progress Notes (Signed)
Subjective:    I'm seeing this patient as a consultation for:  Dr. Yong Channel. Note will be routed back to referring provider/PCP.  CC: R shoulder pain  I, Molly Weber, LAT, ATC, am serving as scribe for Dr. Lynne Leader.  HPI: Pt is a 59 y/o female presenting w/ c/o R shoulder pain x several months w/ no known MOI.  She rates the pain at a 7/10 that she describes as throbbing .  Radiating pain: R upper arm Mechanical symptoms: No Neck pain: Yes Aggravating factors: R shoulder AROM overhead; any R shoulder AROM aBd more limited than flexion; functional IR Treatments tried: OTC meds; topical pain relieving gel/rub  Patient is having pain with normal activities daily living including dressing and showering.  She is right-hand dominant.  Past medical history, Surgical history, Family history, Social history, Allergies, and medications have been entered into the medical record, reviewed.   Review of Systems: No new headache, visual changes, nausea, vomiting, diarrhea, constipation, dizziness, abdominal pain, skin rash, fevers, chills, night sweats, weight loss, swollen lymph nodes, body aches, joint swelling, muscle aches, chest pain, shortness of breath, mood changes, visual or auditory hallucinations.   Objective:    Vitals:   06/12/19 1107  BP: 112/80  Pulse: 92  SpO2: 96%   General: Well Developed, well nourished, and in no acute distress.  Neuro/Psych: Alert and oriented x3, extra-ocular muscles intact, able to move all 4 extremities, sensation grossly intact. Skin: Warm and dry, no rashes noted.  Respiratory: Not using accessory muscles, speaking in full sentences, trachea midline.  Cardiovascular: Pulses palpable, no extremity edema. Abdomen: Does not appear distended. MSK:  C-spine: Normal-appearing normal motion. Right shoulder: Normal-appearing. Mildly tender palpation overlying bicipital groove. Motion: Abduction limited 70 degrees active 100 degrees passive. External  rotation full. Functional internal rotation to iliac crest. Strength: Intact abduction external and internal rotation however significant pain with abduction. Positive empty can test. Positive Hawkins and Neer's test. Mildly positive Yergason's and speeds test.  Contralateral left shoulder normal-appearing nontender normal motion normal strength negative impingement testing.  Pulses cap refill and sensation intact bilateral upper extremities.  Lab and Radiology Results  Limited musculoskeletal ultrasound right shoulder. Biceps tendon intact in bicipital groove with no hypoechoic fluid tracking along tendon sheath. Subscapularis tendon irregularity at footplate but no visible defect in tendon structure.  Intact no tears visible. Supraspinatus tendon: Mild irregularity of footplate.  No obvious visible tears.  Significantly thickened subacromial bursa. Infraspinatus tendon normal-appearing. No significant posterior glenohumeral joint effusion AC joint degenerative with osteophyte narrowed joint space and moderate effusion present. Impression: Subacromial bursitis and AC DJD.  Procedure: Real-time Ultrasound Guided Injection of right shoulder subacromial bursa Device: Philips Affiniti 50G Images permanently stored and available for review in the ultrasound unit. Verbal informed consent obtained.  Discussed risks and benefits of procedure. Warned about infection bleeding damage to structures skin hypopigmentation and fat atrophy among others. Patient expresses understanding and agreement Time-out conducted.   Noted no overlying erythema, induration, or other signs of local infection.   Skin prepped in a sterile fashion.   Local anesthesia: Topical Ethyl chloride.   With sterile technique and under real time ultrasound guidance:  40 mg of Depo-Medrol and 2 mL of Marcaine injected easily.   Completed without difficulty   Pain partially resolved suggesting accurate placement of the  medication.   Advised to call if fevers/chills, erythema, induration, drainage, or persistent bleeding.   Images permanently stored and available for review in the  ultrasound unit.  Impression: Technically successful ultrasound guided injection.       Impression and Recommendations:    Assessment and Plan: 59 y.o. female with right shoulder pain.  Multifactorial. Most dominant finding is subacromial bursitis.  Patient has clinical features of biceps tendinitis but no severe changes on ultrasound examination.  Additionally adhesive capsulitis is a possibility as well given her significant lack of abduction and internal rotation range of motion.  Patient had partial response to subacromial bursa injection today indicating possible multiple pain generators.  Plan to proceed with injection as above physical therapy and recheck in 1 month.  Return sooner if needed.  We will proceed with x-ray imaging and other advanced imaging if needed in the future.  PDMP not reviewed this encounter. Orders Placed This Encounter  Procedures  . [place] LIMITED JOINT SPACE STRUCTURES UP RIGHT(NO LINKED CHARGES)    Order Specific Question:   Reason for Exam (SYMPTOM  OR DIAGNOSIS REQUIRED)    Answer:   R shoulder pain    Order Specific Question:   Preferred imaging location?    Answer:   [place]  . Ambulatory referral to Physical Therapy    Referral Priority:   Routine    Referral Type:   Physical Medicine    Referral Reason:   Specialty Services Required    Requested Specialty:   Physical Therapy   No orders of the defined types were placed in this encounter.   Discussed warning signs or symptoms. Please see discharge instructions. Patient expresses understanding.   The above documentation has been reviewed and is accurate and complete Lynne Leader

## 2019-07-08 ENCOUNTER — Other Ambulatory Visit: Payer: Self-pay | Admitting: Internal Medicine

## 2019-07-17 ENCOUNTER — Other Ambulatory Visit: Payer: Self-pay

## 2019-07-17 ENCOUNTER — Encounter: Payer: Self-pay | Admitting: Family Medicine

## 2019-07-17 ENCOUNTER — Ambulatory Visit (INDEPENDENT_AMBULATORY_CARE_PROVIDER_SITE_OTHER): Payer: Commercial Managed Care - PPO

## 2019-07-17 ENCOUNTER — Ambulatory Visit: Payer: Commercial Managed Care - PPO | Admitting: Family Medicine

## 2019-07-17 ENCOUNTER — Ambulatory Visit: Payer: Self-pay

## 2019-07-17 VITALS — BP 110/80 | Resp 88 | Ht 65.0 in | Wt 162.0 lb

## 2019-07-17 DIAGNOSIS — M25511 Pain in right shoulder: Secondary | ICD-10-CM

## 2019-07-17 NOTE — Progress Notes (Signed)
I, Wendy Poet, LAT, ATC, am serving as scribe for Dr. Lynne Leader.  Denise Beard is a 59 y.o. female who presents to [facility] today for f/u of R shoulder pain that began in late Dec 2020.  She was last seen by Dr. Georgina Snell on 06/12/19 and had a R subacromial injection and was referred to outpatient PT.  Since her last visit, pt reports was feeling really good after her injection pain started back up this week. Did not do the PT     Pertinent review of systems: No fevers or chills  Relevant historical information: Diabetes.  Hyperlipidemia.   Exam:  BP 110/80 (BP Location: Left Arm, Patient Position: Sitting, Cuff Size: Normal)   Resp (!) 88   Ht 5\' 5"  (1.651 m)   Wt 162 lb (73.5 kg)   SpO2 98%   BMI 26.96 kg/m  General: Well Developed, well nourished, and in no acute distress.   MSK: Right shoulder Normal-appearing.  Normal motion pain with abduction. Intact strength. Positive Hawkins and Neer's test.  Positive empty can test. Negative Yergason's and speeds test.   Lab and Radiology Results Procedure: Real-time Ultrasound Guided Injection of right shoulder glenohumeral joint Device: Philips Affiniti 50G Images permanently stored and available for review in the ultrasound unit. Verbal informed consent obtained.  Discussed risks and benefits of procedure. Warned about infection bleeding damage to structures skin hypopigmentation and fat atrophy among others. Patient expresses understanding and agreement Time-out conducted.   Noted no overlying erythema, induration, or other signs of local infection.   Skin prepped in a sterile fashion.   Local anesthesia: Topical Ethyl chloride.   With sterile technique and under real time ultrasound guidance:  40 mg of Kenalog and 2 mL of Marcaine injected easily.   Completed without difficulty   Pain immediately resolved suggesting accurate placement of the medication.   Advised to call if fevers/chills,  erythema, induration, drainage, or persistent bleeding.   Images permanently stored and available for review in the ultrasound unit.  Impression: Technically successful ultrasound guided injection.    X-ray images right shoulder obtained today personally and independently reviewed No severe DJD.  Humerus is a bit high riding. No fractures. Await formal radiology review    Assessment and Plan: 59 y.o. female with right shoulder pain ongoing for several months now.  Patient had partial benefit from subacromial injection last month.  She never did physical therapy as her pain did improve quite a bit however the pain has returned.  Proceeded with glenohumeral injection today and patient had significant symptom improvement immediately following injection.  Plan for dedicated trial of physical therapy.  Check back in 4 to 6 weeks.  If not better likely would proceed with MRI for surgical planning.   PDMP not reviewed this encounter. Orders Placed This Encounter  Procedures  . [place] LIMITED JOINT SPACE STRUCTURES UP RIGHT(NO LINKED CHARGES)    Order Specific Question:   Reason for Exam (SYMPTOM  OR DIAGNOSIS REQUIRED)    Answer:   rt shoulder inj    Order Specific Question:   Preferred imaging location?    Answer:   [place]  . DG Shoulder Right    Standing Status:   Future    Number of Occurrences:   1    Standing Expiration Date:   09/13/2020    Order Specific Question:   Reason for Exam (SYMPTOM  OR DIAGNOSIS REQUIRED)    Answer:  eval right [place] pain    Order Specific Question:   Is patient pregnant?    Answer:   No    Order Specific Question:   Preferred imaging location?    Answer:   [facility]    Order Specific Question:   Radiology Contrast Protocol - do NOT remove file path    Answer:   [email or web address]  . Ambulatory referral to Physical Therapy    Referral Priority:   Routine    Referral Type:    Physical Medicine    Referral Reason:   Specialty Services Required    Requested Specialty:   Physical Therapy   No orders of the defined types were placed in this encounter.    Discussed warning signs or symptoms. Please see discharge instructions. Patient expresses understanding.   The above documentation has been reviewed and is accurate and complete Lynne Leader

## 2019-07-17 NOTE — Patient Instructions (Signed)
Thank you for coming in today. Call or go to the ER if you develop a large red swollen joint with extreme pain or oozing puss.  Attend PT.  Get xray today on your way out.  Recheck in 6 weeks.  Return sooner of let me know if you are not doing well.

## 2019-07-20 NOTE — Progress Notes (Signed)
X-ray shoulder shows some arthritis of the small joint at the top of the shoulder.  The [facility] joint.  Shoulder x-ray otherwise looks normal.

## 2019-07-27 ENCOUNTER — Ambulatory Visit: Payer: Commercial Managed Care - PPO | Admitting: Physical Therapy

## 2019-07-27 ENCOUNTER — Encounter: Payer: Self-pay | Admitting: Physical Therapy

## 2019-07-27 ENCOUNTER — Other Ambulatory Visit: Payer: Self-pay

## 2019-07-27 DIAGNOSIS — G8929 Other chronic pain: Secondary | ICD-10-CM | POA: Diagnosis not present

## 2019-07-27 DIAGNOSIS — M6281 Muscle weakness (generalized): Secondary | ICD-10-CM | POA: Diagnosis not present

## 2019-07-27 DIAGNOSIS — M25511 Pain in right shoulder: Secondary | ICD-10-CM | POA: Diagnosis not present

## 2019-07-27 NOTE — Patient Instructions (Signed)
Access Code: [email or web address]  URL: [email or web address]  Date: 07/27/2019  Prepared by: Lyndee Hensen   Exercises Supine Shoulder Flexion with Dowel - 10 reps - 2 sets - 2x daily Sidelying Shoulder External Rotation - 10 reps - 1-2 sets - 2x daily                           Scapular Retraction with Resistance - 10 reps - 2 sets - 1x daily Shoulder Flexion Wall Slide with Towel - 10 reps - 1-2 sets - 2x daily

## 2019-07-28 NOTE — Therapy (Signed)
[facility] [address] [place], [place], [address] Phone: [phone]   Fax:  [phone]  Physical Therapy Evaluation  Patient Details  Name: Denise Beard MRN: [id number] Date of Birth: 01-17-1961 Referring Provider (PT): Lynne Leader   Encounter Date: 07/27/2019  PT End of Session - 07/27/19 1631    Visit Number  1    Number of Visits  12    Date for PT Re-Evaluation  09/07/19    Authorization Type  UHC    PT Start Time  1602    PT Stop Time  1635    PT Time Calculation (min)  33 min    Activity Tolerance  Patient tolerated treatment well    Behavior During Therapy  [facility] for tasks assessed/performed       Past Medical History:  Diagnosis Date  . ANXIETY 03/17/2007   in past  . ASTHMA 06/26/2007  . DIAB W/O COMP TYPE II/UNS NOT STATED UNCNTRL 04/06/2009  . GERD 06/26/2007  . Irritable bowel syndrome 02/27/2010   constipation  . OA (osteoarthritis) 12/13/2009  . SEIZURE DISORDER 04/17/2010   in past /over 4 years ago/no meds. actually vasovagal per neuro notes    Past Surgical History:  Procedure Laterality Date  . ABDOMINAL HYSTERECTOMY    . CESAREAN SECTION    . TUBAL LIGATION      There were no vitals filed for this visit.   Subjective Assessment - 07/27/19 1608    Subjective  Pt states pain in R shoulder for about 6 mo, no injury to report. She is R handed. 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 Has not had previous shoulder problems. Did have injection recently, thinks it helped some, but still sore. Pt states inability to lift/use arm.    Patient Stated Goals  decreased pain    Currently in Pain?  Yes    Pain Score  7     Pain Location  Shoulder    Pain Orientation  Right    Pain Descriptors / Indicators  Aching    Pain Type  Acute pain    Pain Onset  More than a month ago    Pain Frequency  Intermittent    Aggravating Factors   night (better since shot) , increased activity, reaching, lifting, carrying    Pain  Relieving Factors  none         [facility] PT Assessment - 07/27/19 1641      Assessment   Medical Diagnosis  R shoulder pain    Referring Provider (PT)  Lynne Leader    Hand Dominance  Right    Prior Therapy  no      Balance Screen   Has the patient fallen in the past 6 months  No      Prior Function   Level of Independence  Independent      Cognition   Overall Cognitive Status  Within Functional Limits for tasks assessed      AROM   Overall AROM Comments  IR behind back: unable    Right Shoulder Flexion  90 Degrees    Right Shoulder ABduction  70 Degrees    Right Shoulder Internal Rotation  --   wnl   Right Shoulder External Rotation  --   wnl     PROM   Overall PROM Comments  R shoulder PROM: WNL:       Strength   Right Shoulder Flexion  3-/5    Right Shoulder ABduction  3-/5  Right Shoulder Internal Rotation  4-/5    Right Shoulder External Rotation  4-/5      Palpation   Palpation comment  Pain at supraspinatus region                 Objective measurements completed on examination: See above findings.      [facility] Adult PT Treatment/Exercise - 07/27/19 1641      Exercises   Exercises  Shoulder      Shoulder Exercises: Supine   Flexion  AAROM;15 reps    Flexion Limitations  cane      Shoulder Exercises: Sidelying   External Rotation  10 reps      Shoulder Exercises: Standing   Row  10 reps    Theraband Level (Shoulder Row)  Level 2 (Red)    Other Standing Exercises  wall slides x10 R ; 1 UE              PT Education - 07/27/19 1637    Education Details  PT POC, Exam findings, HEP    Person(s) Educated  Patient    Methods  Explanation;Demonstration;Handout;Verbal cues    Comprehension  Verbalized understanding;Returned demonstration;Verbal cues required;Tactile cues required;Need further instruction       PT Short Term Goals - 07/28/19 0908      PT SHORT TERM GOAL #1   Title  Pt to be independent with initial HEP 2    Period   Weeks    Status  New    Target Date  08/10/19      PT SHORT TERM GOAL #2   Title  Pt to demo improved flexion AROM by at least 10 deg    Time  2    Period  Weeks    Status  New    Target Date  08/10/19        PT Long Term Goals - 07/28/19 0908      PT LONG TERM GOAL #1   Title  Pt to demo full R shoulder AROM, without pain, to improve ability for ADLs.    Time  6    Period  Weeks    Status  New    Target Date  09/07/19      PT LONG TERM GOAL #2   Title  Pt to demo improved strength of R shoulder, to at least 4+/5 to improve ability for IADLs.    Time  6    Period  Weeks    Status  New    Target Date  09/07/19      PT LONG TERM GOAL #3   Title  Pt to report decreased pain to 0-2/10 with activity and IADLs.    Time  6    Period  Weeks    Status  New    Target Date  09/07/19      PT LONG TERM GOAL #4   Title  Pt to be independent with final HEP    Time  6    Period  Weeks    Status  New    Target Date  09/07/19             Plan - 07/28/19 0900    Clinical Impression Statement  Pt presents with primary complaint of increased pain in R shoulder. Pt with significant limitation with AROM, but good PROM. She is also lacking strength in R UE, mostly for elevation motions. Pt with decreased ability for full functional actiities, reaching, lifting, carrying, and  ADLS. Pt to benefit from skilled PT to improve.    Personal Factors and Comorbidities  Time since onset of injury/illness/exacerbation    Examination-Activity Limitations  Bathing;Reach Overhead;Carry;Lift    Examination-Participation Restrictions  Meal Prep;Cleaning;Community Activity;Driving;Shop;Laundry;Yard Work    Stability/Clinical Decision Making  Stable/Uncomplicated    Clinical Decision Making  Low    Rehab Potential  Good    PT Frequency  2x / week    PT Duration  6 weeks    PT Treatment/Interventions  ADLs/Self Care Home Management;Cryotherapy;Electrical Stimulation;Ultrasound;Moist  Heat;Iontophoresis 4mg /ml Dexamethasone;Stair training;Functional mobility training;Therapeutic activities;Therapeutic exercise;Neuromuscular re-education;Manual techniques;Patient/family education;Passive range of motion;Dry needling;Taping;Joint Manipulations;Spinal Manipulations;Vasopneumatic Device    Consulted and Agree with Plan of Care  Patient       Patient will benefit from skilled therapeutic intervention in order to improve the following deficits and impairments:  Decreased range of motion, Impaired UE functional use, Increased muscle spasms, Decreased activity tolerance, Pain, Decreased strength  Visit Diagnosis: Chronic right shoulder pain  Muscle weakness (generalized)     Problem List Patient Active Problem List   Diagnosis Date Noted  . Osteoarthritis 02/04/2015  . Vasovagal syncope 02/04/2015  . Hyperlipidemia associated with type 2 diabetes mellitus ([place]) 02/04/2015  . Allergic rhinitis 02/04/2015  . Insomnia 02/04/2015  . Irritable bowel syndrome 02/27/2010  . Type II diabetes mellitus, well controlled ([place]) 04/06/2009  . Asthma 06/26/2007  . GERD 06/26/2007    Lyndee Hensen, PT, DPT 9:11 AM  07/28/19    [facility] [place], [place], [address] Phone: [phone]   Fax:  [phone]  Name: Denise Beard MRN: [id number] Date of Birth: Mar 28, 1961

## 2019-07-30 ENCOUNTER — Ambulatory Visit: Payer: Commercial Managed Care - PPO | Attending: Family

## 2019-07-30 DIAGNOSIS — Z23 Encounter for immunization: Secondary | ICD-10-CM

## 2019-07-30 NOTE — Progress Notes (Signed)
   Covid-19 Vaccination Clinic  Name:  CLATIE WASKO    MRN: [id number] DOB: 1960/07/22  07/30/2019  Ms. Suriano was observed post Covid-19 immunization for 15 minutes without incident. She was provided with Vaccine Information Sheet and instruction to access the V-Safe system.   Ms. Parrot was instructed to call 911 with any severe reactions post vaccine: Marland Kitchen Difficulty breathing  . Swelling of face and throat  . A fast heartbeat  . A bad rash all over body  . Dizziness and weakness   Immunizations Administered    Name Date Dose VIS Date Route   Moderna COVID-19 Vaccine 07/30/2019  1:21 PM 0.5 mL 04/21/2019 Intramuscular   Manufacturer: Moderna   Lot: [id number]   [id number]

## 2019-08-03 ENCOUNTER — Ambulatory Visit: Payer: Commercial Managed Care - PPO | Admitting: Physical Therapy

## 2019-08-03 ENCOUNTER — Other Ambulatory Visit: Payer: Self-pay

## 2019-08-03 ENCOUNTER — Encounter: Payer: Self-pay | Admitting: Physical Therapy

## 2019-08-03 DIAGNOSIS — M25511 Pain in right shoulder: Secondary | ICD-10-CM | POA: Diagnosis not present

## 2019-08-03 DIAGNOSIS — G8929 Other chronic pain: Secondary | ICD-10-CM

## 2019-08-03 DIAGNOSIS — M6281 Muscle weakness (generalized): Secondary | ICD-10-CM | POA: Diagnosis not present

## 2019-08-03 NOTE — Therapy (Signed)
[facility] [address] [place], [place], [address] Phone: [phone]   Fax:  [phone]  Physical Therapy Treatment  Patient Details  Name: Denise Beard MRN: [id number] Date of Birth: [birth year] Referring Provider (PT): Lynne Leader   Encounter Date: 08/03/2019  PT End of Session - 08/03/19 1602    Visit Number  2    Number of Visits  12    Date for PT Re-Evaluation  09/07/19    Authorization Type  UHC    PT Start Time  [id number]    PT Stop Time  1436    PT Time Calculation (min)  40 min    Activity Tolerance  Patient tolerated treatment well    Behavior During Therapy  [facility] for tasks assessed/performed       Past Medical History:  Diagnosis Date  . ANXIETY 03/17/2007   in past  . ASTHMA 06/26/2007  . DIAB W/O COMP TYPE II/UNS NOT STATED UNCNTRL 04/06/2009  . GERD 06/26/2007  . Irritable bowel syndrome 02/27/2010   constipation  . OA (osteoarthritis) 12/13/2009  . SEIZURE DISORDER 04/17/2010   in past /over 4 years ago/no meds. actually vasovagal per neuro notes    Past Surgical History:  Procedure Laterality Date  . ABDOMINAL HYSTERECTOMY    . CESAREAN SECTION    . TUBAL LIGATION      There were no vitals filed for this visit.  Subjective Assessment - 08/03/19 1601    Subjective  Pt states shoulder is very sore today, and down into arm.    Patient Stated Goals  decreased pain    Currently in Pain?  Yes    Pain Score  7     Pain Location  Shoulder    Pain Orientation  Right    Pain Descriptors / Indicators  Aching    Pain Type  Acute pain    Pain Onset  More than a month ago    Pain Frequency  Intermittent                       [facility] Adult PT Treatment/Exercise - 08/03/19 1555      Exercises   Exercises  Shoulder      Shoulder Exercises: Supine   External Rotation  15 reps    External Rotation Weight (lbs)  2    Flexion  AAROM;15 reps    Flexion Limitations  cane    Other Supine Exercises   Flexion AROM x10; Horizontal abd x10;       Shoulder Exercises: Sidelying   External Rotation  --      Shoulder Exercises: Standing   External Rotation  10 reps    Theraband Level (Shoulder External Rotation)  Level 2 (Red)    Internal Rotation  10 reps    Theraband Level (Shoulder Internal Rotation)  Level 2 (Red)    Flexion  15 reps;AROM    ABduction  15 reps    ABduction Limitations  to 90 deg    Row  10 reps    Theraband Level (Shoulder Row)  Level 2 (Red)    Other Standing Exercises  wall slides x10 R ; 1 UE       Shoulder Exercises: Pulleys   Flexion  2 minutes    ABduction  1 minute      Modalities   Modalities  Iontophoresis      Iontophoresis   Type of Iontophoresis  Dexamethasone    Location  R shoulder    Time  4 hr patch      Manual Therapy   Manual Therapy  Soft tissue mobilization;Joint mobilization    Joint Mobilization  Long axis distraction R shoulder    Soft tissue mobilization  STM/DTM to R posterior shoulderr and UT                PT Short Term Goals - 07/28/19 0908      PT SHORT TERM GOAL #1   Title  Pt to be independent with initial HEP 2    Period  Weeks    Status  New    Target Date  08/10/19      PT SHORT TERM GOAL #2   Title  Pt to demo improved flexion AROM by at least 10 deg    Time  2    Period  Weeks    Status  New    Target Date  08/10/19        PT Long Term Goals - 07/28/19 0908      PT LONG TERM GOAL #1   Title  Pt to demo full R shoulder AROM, without pain, to improve ability for ADLs.    Time  6    Period  Weeks    Status  New    Target Date  09/07/19      PT LONG TERM GOAL #2   Title  Pt to demo improved strength of R shoulder, to at least 4+/5 to improve ability for IADLs.    Time  6    Period  Weeks    Status  New    Target Date  09/07/19      PT LONG TERM GOAL #3   Title  Pt to report decreased pain to 0-2/10 with activity and IADLs.    Time  6    Period  Weeks    Status  New    Target Date   09/07/19      PT LONG TERM GOAL #4   Title  Pt to be independent with final HEP    Time  6    Period  Weeks    Status  New    Target Date  09/07/19            Plan - 08/03/19 1640    Clinical Impression Statement  Pt with soreness in supraspinatus region with palpation today, as well as tightenss and soreness in R UT. HEP updated to include stretching for UT. Manual and ionto done for pain. Pt with improved ability for flexion AROM today, plan to progress as tolerated.    Personal Factors and Comorbidities  Time since onset of injury/illness/exacerbation    Examination-Activity Limitations  Bathing;Reach Overhead;Carry;Lift    Examination-Participation Restrictions  Meal Prep;Cleaning;Community Activity;Driving;Shop;Laundry;Yard Work    Stability/Clinical Decision Making  Stable/Uncomplicated    Rehab Potential  Good    PT Frequency  2x / week    PT Duration  6 weeks    PT Treatment/Interventions  ADLs/Self Care Home Management;Cryotherapy;Electrical Stimulation;Ultrasound;Moist Heat;Iontophoresis 4mg /ml Dexamethasone;Stair training;Functional mobility training;Therapeutic activities;Therapeutic exercise;Neuromuscular re-education;Manual techniques;Patient/family education;Passive range of motion;Dry needling;Taping;Joint Manipulations;Spinal Manipulations;Vasopneumatic Device    Consulted and Agree with Plan of Care  Patient       Patient will benefit from skilled therapeutic intervention in order to improve the following deficits and impairments:  Decreased range of motion, Impaired UE functional use, Increased muscle spasms, Decreased activity tolerance, Pain, Decreased strength  Visit Diagnosis: Chronic right shoulder  pain  Muscle weakness (generalized)     Problem List Patient Active Problem List   Diagnosis Date Noted  . Osteoarthritis 02/04/2015  . Vasovagal syncope 02/04/2015  . Hyperlipidemia associated with type 2 diabetes mellitus ([place]) 02/04/2015  . Allergic  rhinitis 02/04/2015  . Insomnia 02/04/2015  . Irritable bowel syndrome 02/27/2010  . Type II diabetes mellitus, well controlled ([place]) 04/06/2009  . Asthma 06/26/2007  . GERD 06/26/2007    Lyndee Hensen, PT, DPT 4:44 PM  08/03/19    [facility] [place], [place], [address] Phone: [phone]   Fax:  [phone]  Name: YUKIE RODA MRN: [id number] Date of Birth: [birth year]

## 2019-08-10 ENCOUNTER — Encounter: Payer: Commercial Managed Care - PPO | Admitting: Physical Therapy

## 2019-08-11 ENCOUNTER — Ambulatory Visit (INDEPENDENT_AMBULATORY_CARE_PROVIDER_SITE_OTHER): Payer: Commercial Managed Care - PPO | Admitting: Family Medicine

## 2019-08-11 DIAGNOSIS — J329 Chronic sinusitis, unspecified: Secondary | ICD-10-CM

## 2019-08-11 MED ORDER — AZELASTINE HCL 0.1 % NA SOLN: 2.0000 | Freq: Two times a day (BID) | NASAL | 12 refills | Status: DC

## 2019-08-11 MED ORDER — AMOXICILLIN-POT CLAVULANATE 875-125 MG PO TABS: 1.0000 | ORAL_TABLET | Freq: Two times a day (BID) | ORAL | 0 refills | Status: DC

## 2019-08-11 NOTE — Progress Notes (Signed)
   Denise Beard is a 59 y.o. female who presents today for a virtual office visit.  Assessment/Plan:  Sinusitis No red flags.  Will start Astelin nasal spray.  Continue over-the-counter medications.  Will send in "pocket prescription" for Augmentin with instruction to not start with symptoms improve the next few days.    Subjective:  HPI:  Patient has been having sinus pressure for the past 5 to 6 days.  Initially located and nasal area and left eye.  Started radiating to her jaw the last few days.  Tried taking Benadryl with modest improvement.  She has not tried any Flonase or other nasal sprays.  No cough.  No fever or chills.  No nausea or vomiting.       Objective/Observations  Physical Exam: Gen: NAD, resting comfortably Pulm: Normal work of breathing Neuro: Grossly normal, moves all extremities Psych: Normal affect and thought content  Virtual Visit via Video   I connected with Deatra Ina on 08/11/19 at 11:40 AM EDT by a video enabled telemedicine application and verified that I am speaking with the correct person using two identifiers. The limitations of evaluation and management by telemedicine and the availability of in person appointments were discussed. The patient expressed understanding and agreed to proceed.   Patient location: Home Provider location: [place] participating in the virtual visit: Myself and Patient     Algis Greenhouse. Jerline Pain, MD 08/11/2019 12:07 PM

## 2019-08-17 ENCOUNTER — Other Ambulatory Visit: Payer: Self-pay

## 2019-08-17 ENCOUNTER — Encounter: Payer: Self-pay | Admitting: Physical Therapy

## 2019-08-17 ENCOUNTER — Ambulatory Visit: Payer: Commercial Managed Care - PPO | Admitting: Physical Therapy

## 2019-08-17 DIAGNOSIS — G8929 Other chronic pain: Secondary | ICD-10-CM

## 2019-08-17 DIAGNOSIS — M6281 Muscle weakness (generalized): Secondary | ICD-10-CM

## 2019-08-17 DIAGNOSIS — M25511 Pain in right shoulder: Secondary | ICD-10-CM | POA: Diagnosis not present

## 2019-08-18 NOTE — Therapy (Signed)
[facility] [address] [place], [place], [address] Phone: [phone]   Fax:  [phone]  Physical Therapy Treatment  Patient Details  Name: Denise Beard MRN: [id number] Date of Birth: 1961-03-08 Referring Provider (PT): Lynne Leader   Encounter Date: 08/17/2019  PT End of Session - 08/17/19 1624    Visit Number  3    Number of Visits  12    Date for PT Re-Evaluation  09/07/19    Authorization Type  UHC    PT Start Time  1606    PT Stop Time  [id number]    PT Time Calculation (min)  41 min    Activity Tolerance  Patient tolerated treatment well    Behavior During Therapy  [facility] for tasks assessed/performed       Past Medical History:  Diagnosis Date  . ANXIETY 03/17/2007   in past  . ASTHMA 06/26/2007  . DIAB W/O COMP TYPE II/UNS NOT STATED UNCNTRL 04/06/2009  . GERD 06/26/2007  . Irritable bowel syndrome 02/27/2010   constipation  . OA (osteoarthritis) 12/13/2009  . SEIZURE DISORDER 04/17/2010   in past /over 4 years ago/no meds. actually vasovagal per neuro notes    Past Surgical History:  Procedure Laterality Date  . ABDOMINAL HYSTERECTOMY    . CESAREAN SECTION    . TUBAL LIGATION      There were no vitals filed for this visit.  Subjective Assessment - 08/17/19 1623    Subjective  Pt states less pain in shoulder and arm. Has been doing HEP. States improvments with reaching out in front, but continued pain with reaching out or back.    Currently in Pain?  Yes    Pain Score  4     Pain Location  Shoulder    Pain Orientation  Right    Pain Descriptors / Indicators  Aching    Pain Type  Acute pain    Pain Onset  More than a month ago    Pain Frequency  Intermittent                       [facility] Adult PT Treatment/Exercise - 08/17/19 1614      Exercises   Exercises  Shoulder      Shoulder Exercises: Supine   Protraction  20 reps    Protraction Weight (lbs)  2    Protraction Limitations  SA punch     External Rotation  20 reps    External Rotation Weight (lbs)  2    Flexion  AAROM;15 reps    Flexion Limitations  cane    Other Supine Exercises  Flexion AROM x10; Horizontal abd x10;     Other Supine Exercises  Horiz Abd YTB x15;       Shoulder Exercises: Standing   External Rotation  15 reps    Theraband Level (Shoulder External Rotation)  Level 2 (Red)    Internal Rotation  15 reps    Theraband Level (Shoulder Internal Rotation)  Level 2 (Red)    Flexion  15 reps;AROM    ABduction  15 reps    ABduction Limitations  --    Row  20 reps    Theraband Level (Shoulder Row)  Level 3 (Green)    Other Standing Exercises  --    Other Standing Exercises  Wall angel x10;       Shoulder Exercises: Pulleys   Flexion  2 minutes    ABduction  1 minute      Modalities   Modalities  Iontophoresis      Iontophoresis   Type of Iontophoresis  Dexamethasone    Location  R shoulder    Time  4 hr patch      Manual Therapy   Manual Therapy  Soft tissue mobilization;Joint mobilization;Passive ROM    Joint Mobilization  Long axis distraction R shoulder , Post and inf mobs gr 3 ;     Soft tissue mobilization  STM/DTM to R posterior shoulderr and UT     Passive ROM  PROM R shoulder                PT Short Term Goals - 07/28/19 0908      PT SHORT TERM GOAL #1   Title  Pt to be independent with initial HEP 2    Period  Weeks    Status  New    Target Date  08/10/19      PT SHORT TERM GOAL #2   Title  Pt to demo improved flexion AROM by at least 10 deg    Time  2    Period  Weeks    Status  New    Target Date  08/10/19        PT Long Term Goals - 07/28/19 0908      PT LONG TERM GOAL #1   Title  Pt to demo full R shoulder AROM, without pain, to improve ability for ADLs.    Time  6    Period  Weeks    Status  New    Target Date  09/07/19      PT LONG TERM GOAL #2   Title  Pt to demo improved strength of R shoulder, to at least 4+/5 to improve ability for IADLs.    Time  6     Period  Weeks    Status  New    Target Date  09/07/19      PT LONG TERM GOAL #3   Title  Pt to report decreased pain to 0-2/10 with activity and IADLs.    Time  6    Period  Weeks    Status  New    Target Date  09/07/19      PT LONG TERM GOAL #4   Title  Pt to be independent with final HEP    Time  6    Period  Weeks    Status  New    Target Date  09/07/19            Plan - 08/18/19 0944    Clinical Impression Statement  Pt with improving strength, and improving ability for forward flexion ROM and reaching. Minimal pain with activiteis today. Still has some limitation with pain with IR behind back, as well as reaching out to side. Pt to benefit from continued care.    Personal Factors and Comorbidities  Time since onset of injury/illness/exacerbation    Examination-Activity Limitations  Bathing;Reach Overhead;Carry;Lift    Examination-Participation Restrictions  Meal Prep;Cleaning;Community Activity;Driving;Shop;Laundry;Yard Work    Stability/Clinical Decision Making  Stable/Uncomplicated    Rehab Potential  Good    PT Frequency  2x / week    PT Duration  6 weeks    PT Treatment/Interventions  ADLs/Self Care Home Management;Cryotherapy;Electrical Stimulation;Ultrasound;Moist Heat;Iontophoresis 4mg /ml Dexamethasone;Stair training;Functional mobility training;Therapeutic activities;Therapeutic exercise;Neuromuscular re-education;Manual techniques;Patient/family education;Passive range of motion;Dry needling;Taping;Joint Manipulations;Spinal Manipulations;Vasopneumatic Device    Consulted and Agree with Plan of Care  Patient       Patient will benefit from skilled therapeutic intervention in order to improve the following deficits and impairments:  Decreased range of motion, Impaired UE functional use, Increased muscle spasms, Decreased activity tolerance, Pain, Decreased strength  Visit Diagnosis: Chronic right shoulder pain  Muscle weakness  (generalized)     Problem List Patient Active Problem List   Diagnosis Date Noted  . Osteoarthritis 02/04/2015  . Vasovagal syncope 02/04/2015  . Hyperlipidemia associated with type 2 diabetes mellitus ([place]) 02/04/2015  . Allergic rhinitis 02/04/2015  . Insomnia 02/04/2015  . Irritable bowel syndrome 02/27/2010  . Type II diabetes mellitus, well controlled ([place]) 04/06/2009  . Asthma 06/26/2007  . GERD 06/26/2007   Lyndee Hensen, PT, DPT 9:45 AM  08/18/19    [facility] [place], [place], [address] Phone: [phone]   Fax:  [phone]  Name: Denise Beard MRN: [id number] Date of Birth: Nov 27, 1960

## 2019-08-24 ENCOUNTER — Encounter: Payer: Self-pay | Admitting: Physical Therapy

## 2019-08-24 ENCOUNTER — Other Ambulatory Visit: Payer: Self-pay

## 2019-08-24 ENCOUNTER — Ambulatory Visit: Payer: Commercial Managed Care - PPO | Admitting: Physical Therapy

## 2019-08-24 DIAGNOSIS — M6281 Muscle weakness (generalized): Secondary | ICD-10-CM | POA: Diagnosis not present

## 2019-08-24 DIAGNOSIS — M25511 Pain in right shoulder: Secondary | ICD-10-CM | POA: Diagnosis not present

## 2019-08-24 DIAGNOSIS — G8929 Other chronic pain: Secondary | ICD-10-CM | POA: Diagnosis not present

## 2019-08-24 NOTE — Therapy (Addendum)
[facility] [address] [place], [place], [address] Phone: [phone]   Fax:  [phone]  Physical Therapy Treatment  Patient Details  Name: Denise Beard MRN: [id number] Date of Birth: 09/26/1960 Referring Provider (PT): Lynne Leader   Encounter Date: 08/24/2019  PT End of Session - 08/24/19 1602     Visit Number  4    Number of Visits  12    Date for PT Re-Evaluation  09/07/19    Authorization Type  [employer]    PT Start Time  1600    PT Stop Time  5462    PT Time Calculation (min)  47 min    Activity Tolerance  Patient tolerated treatment well    Behavior During Therapy  [facility] [place] for tasks assessed/performed        Past Medical History:  Diagnosis Date   ANXIETY 03/17/2007   in past   ASTHMA 06/26/2007   DIAB W/O COMP TYPE II/UNS NOT STATED UNCNTRL 04/06/2009   GERD 06/26/2007   Irritable bowel syndrome 02/27/2010   constipation   OA (osteoarthritis) 12/13/2009   SEIZURE DISORDER 04/17/2010   in past Mariana Arn 4 years ago/no meds. actually vasovagal per neuro notes    Past Surgical History:  Procedure Laterality Date   ABDOMINAL HYSTERECTOMY     CESAREAN SECTION     TUBAL LIGATION      There were no vitals filed for this visit.  Subjective Assessment - 08/24/19 1601     Subjective  Pt with a little soreness in shoulder today.    Patient Stated Goals  decreased pain    Currently in Pain?  Yes    Pain Score  5     Pain Location  Shoulder    Pain Orientation  Right    Pain Descriptors / Indicators  Aching    Pain Type  Acute pain    Pain Onset  More than a month ago    Pain Frequency  Intermittent                        [facility] Adult PT Treatment/Exercise - 08/24/19 1603       Exercises   Exercises  Shoulder      Shoulder Exercises: Supine   Protraction  20 reps    Protraction Weight (lbs)  2    Protraction Limitations  SA punch    External Rotation  20 reps    External Rotation Weight (lbs)  2     Flexion  --    Flexion Limitations  --    Other Supine Exercises  --    Other Supine Exercises  Horiz Abd YTB x15;       Shoulder Exercises: Sidelying   External Rotation  15 reps    External Rotation Weight (lbs)  2      Shoulder Exercises: Standing   External Rotation  15 reps    Theraband Level (Shoulder External Rotation)  Level 2 (Red)    Internal Rotation  15 reps    Theraband Level (Shoulder Internal Rotation)  Level 2 (Red)    Flexion  15 reps;AROM    ABduction  15 reps    Row  20 reps    Theraband Level (Shoulder Row)  Level 3 (Green)    Other Standing Exercises  Wall angel x10;       Shoulder Exercises: Pulleys   Flexion  --    ABduction  --  Shoulder Exercises: ROM/Strengthening   UBE (Upper Arm Bike)  L 1 x 4 min (fwd/bwd)       Modalities   Modalities  Iontophoresis      Iontophoresis   Type of Iontophoresis  Dexamethasone    Location  R shoulder    Time  4 hr patch      Manual Therapy   Manual Therapy  Soft tissue mobilization;Joint mobilization;Passive ROM    Manual therapy comments  skilled palpation and monitoring of soft tissue with dry needling     Joint Mobilization  Long axis distraction R shoulder , Post and inf mobs gr 3 ;     Soft tissue mobilization  STM/DTM to R posterior shoulderr and UT     Passive ROM  --        Trigger Point Dry Needling - 08/24/19 0001     Consent Given?  Yes    Education Handout Provided  Yes    Muscles Treated Head and Neck  Upper trapezius    Upper Trapezius Response  Twitch reponse elicited;Palpable increased muscle length   R             PT Short Term Goals - 07/28/19 0908       PT SHORT TERM GOAL #1   Title  Pt to be independent with initial HEP 2    Period  Weeks    Status  New    Target Date  08/10/19      PT SHORT TERM GOAL #2   Title  Pt to demo improved flexion AROM by at least 10 deg    Time  2    Period  Weeks    Status  New    Target Date  08/10/19         PT Long Term  Goals - 07/28/19 0908       PT LONG TERM GOAL #1   Title  Pt to demo full R shoulder AROM, without pain, to improve ability for ADLs.    Time  6    Period  Weeks    Status  New    Target Date  09/07/19      PT LONG TERM GOAL #2   Title  Pt to demo improved strength of R shoulder, to at least 4+/5 to improve ability for IADLs.    Time  6    Period  Weeks    Status  New    Target Date  09/07/19      PT LONG TERM GOAL #3   Title  Pt to report decreased pain to 0-2/10 with activity and IADLs.    Time  6    Period  Weeks    Status  New    Target Date  09/07/19      PT LONG TERM GOAL #4   Title  Pt to be independent with final HEP    Time  6    Period  Weeks    Status  New    Target Date  09/07/19             Plan - 08/24/19 1651     Clinical Impression Statement  Pt wtih good ability for AROM (WNL) with no pain. Minimal pain with strengthening today. DN done for R UT today, will assess effects next visit. Pt continues to report dull sorness at supraspinatus region. Pt with MD f/u this week. Will benefit from continued care.    Personal Factors and Comorbidities  Time since onset of injury/illness/exacerbation    Examination-Activity Limitations  Bathing;Reach Overhead;Carry;Lift    Examination-Participation Restrictions  Meal Prep;Cleaning;Community Activity;Driving;Shop;Laundry;Yard Work    Stability/Clinical Decision Making  Stable/Uncomplicated    Rehab Potential  Good    PT Frequency  2x / week    PT Duration  6 weeks    PT Treatment/Interventions  ADLs/Self Care Home Management;Cryotherapy;Electrical Stimulation;Ultrasound;Moist Heat;Iontophoresis 17m/ml Dexamethasone;Stair training;Functional mobility training;Therapeutic activities;Therapeutic exercise;Neuromuscular re-education;Manual techniques;Patient/family education;Passive range of motion;Dry needling;Taping;Joint Manipulations;Spinal Manipulations;Vasopneumatic Device    Consulted and Agree with Plan of Care   Patient        Patient will benefit from skilled therapeutic intervention in order to improve the following deficits and impairments:  Decreased range of motion, Impaired UE functional use, Increased muscle spasms, Decreased activity tolerance, Pain, Decreased strength  Visit Diagnosis: Chronic right shoulder pain  Muscle weakness (generalized)     Problem List Patient Active Problem List   Diagnosis Date Noted   Osteoarthritis 02/04/2015   Vasovagal syncope 02/04/2015   Hyperlipidemia associated with type 2 diabetes mellitus ([place] 02/04/2015   Allergic rhinitis 02/04/2015   Insomnia 02/04/2015   Irritable bowel syndrome 02/27/2010   Type II diabetes mellitus, well controlled ([place] 04/06/2009   Asthma 06/26/2007   GERD 06/26/2007   LLyndee Hensen PT, DPT 4:52 PM  08/24/19   [place] [address]Phone: [phone]  Fax:  [phone] Name: Denise GERMANYMRN: [id number] of Birth: 1October 16, 1962 PHYSICAL THERAPY DISCHARGE SUMMARY  Visits from Start of Care: 4 Plan: Patient agrees to discharge.  Patient goals were partially met. Patient is being discharged due to - not returning since last visit.      LLyndee Hensen PT, DPT 2:10 PM  06/19/21

## 2019-08-28 ENCOUNTER — Encounter: Payer: Self-pay | Admitting: Internal Medicine

## 2019-08-28 ENCOUNTER — Ambulatory Visit (INDEPENDENT_AMBULATORY_CARE_PROVIDER_SITE_OTHER): Payer: Commercial Managed Care - PPO | Admitting: Internal Medicine

## 2019-08-28 ENCOUNTER — Other Ambulatory Visit: Payer: Self-pay

## 2019-08-28 ENCOUNTER — Encounter: Payer: Self-pay | Admitting: Family Medicine

## 2019-08-28 ENCOUNTER — Ambulatory Visit: Payer: Commercial Managed Care - PPO | Admitting: Family Medicine

## 2019-08-28 VITALS — BP 118/70 | HR 90 | Ht 65.0 in | Wt 160.0 lb

## 2019-08-28 VITALS — BP 104/74 | HR 96 | Ht 65.0 in | Wt 161.0 lb

## 2019-08-28 DIAGNOSIS — M25511 Pain in right shoulder: Secondary | ICD-10-CM | POA: Diagnosis not present

## 2019-08-28 DIAGNOSIS — E119 Type 2 diabetes mellitus without complications: Secondary | ICD-10-CM

## 2019-08-28 LAB — POCT GLYCOSYLATED HEMOGLOBIN (HGB A1C): Hemoglobin A1C: 10.2 % — AB (ref 4.0–5.6)

## 2019-08-28 MED ORDER — LORAZEPAM 0.5 MG PO TABS: ORAL_TABLET | ORAL | 0 refills | Status: DC

## 2019-08-28 MED ORDER — INSULIN LISPRO (1 UNIT DIAL) 100 UNIT/ML (KWIKPEN): 10.0000 [IU] | PEN_INJECTOR | Freq: Three times a day (TID) | SUBCUTANEOUS | 5 refills | Status: DC

## 2019-08-28 MED ORDER — LANTUS SOLOSTAR 100 UNIT/ML ~~LOC~~ SOPN: PEN_INJECTOR | SUBCUTANEOUS | 4 refills | Status: DC

## 2019-08-28 NOTE — Progress Notes (Signed)
I, Wendy Poet, LAT, ATC, am serving as scribe for Dr. Lynne Leader.  Denise Beard is a 59 y.o. female who presents to [facility] today for f/u of R shoulder pain that began in late December 2020.  She was last seen by Dr. Georgina Snell on 07/17/19 and had a R shoulder GHJ injection.  She has completed 4 PT sessions.  She also had a prior R subacromial bursa injection on 06/12/19.  Since her last visit, pt reports that she is a little better in terms of her R shoulder pain. She notes that the last injection helped for about 3-4 weeks.  She locates her pain to her R superior shoulder and states that she has "good days and bad days" w/ her shoulder.  She states that she's not sure if PT is making as much of an impact as she was hoping.    Diagnostic imaging: R shoulder XR- 07/17/19  Pertinent review of systems: No fevers or chills  Relevant historical information: Diabetes   Exam:  BP 104/74 (BP Location: Right Arm, Patient Position: Sitting, Cuff Size: Normal)   Pulse 96   Ht 5\' 5"  (1.651 m)   Wt 161 lb (73 kg)   SpO2 96%   BMI 26.79 kg/m  General: Well Developed, well nourished, and in no acute distress.   MSK: Right shoulder  Normal-appearing nontender normal motion pain with abduction.  Intact strength however some pain with abduction and external rotation. Positive Hawkins and Neer's test mildly. Negative Yergason's and speeds test.    Lab and Radiology Results  EXAM: RIGHT SHOULDER - 2+ VIEW  COMPARISON:  None.  FINDINGS: There is no evidence of fracture or dislocation. Mild acromioclavicular degenerative disease. Soft tissues are unremarkable.  IMPRESSION: Mild acromioclavicular osteoarthritis. Otherwise negative.   Electronically Signed   By: Inge Rise M.D.   On: 07/17/2019 16:08  I, Lynne Leader, personally (independently) visualized and performed the interpretation of the images attached in this note.    Assessment and  Plan: 59 y.o. female with right shoulder pain.  Symptoms have been ongoing since January.  Patient has had both subacromial and glenohumeral injections.  She also has a great trial of physical therapy.  At this point she is improved a bit but not sufficiently improved.  She is willing to consider surgery and her symptoms are interfering with her quality of life to a moderate degree.  For example she has been woken up by the pain and having pain with activities of daily living like bathing and grooming. Plan on MRI to further characterize cause of pain and for potential further injection or surgical planning.  Recheck after MRI. Ativan for anxiety with MRI.  PDMP not reviewed this encounter. Orders Placed This Encounter  Procedures  . MR SHOULDER RIGHT WO CONTRAST    Standing Status:   Future    Standing Expiration Date:   10/27/2020    Order Specific Question:   ** REASON FOR EXAM (FREE TEXT)    Answer:   right shoulder pain    Order Specific Question:   What is the patient's sedation requirement?    Answer:   Anti-anxiety    Order Specific Question:   Does the patient have a pacemaker or implanted devices?    Answer:   No    Order Specific Question:   Preferred imaging location?    Answer:   [facility] (table limit-550lbs)    Order Specific Question:  Radiology Contrast Protocol - do NOT remove file path    Answer:   [email or web address]   Meds ordered this encounter  Medications  . LORazepam (ATIVAN) 0.5 MG tablet    Sig: 1-2 tabs 30 - 60 min prior to MRI. Do not drive with this medicine.    Dispense:  4 tablet    Refill:  0     Discussed warning signs or symptoms. Please see discharge instructions. Patient expresses understanding.   The above documentation has been reviewed and is accurate and complete Lynne Leader   Total encounter time 30 minutes including charting time date of service. Discussed next steps and MRI planning and follow-up  plans

## 2019-08-28 NOTE — Patient Instructions (Signed)
Thank you for coming in today. You should hear soon about MRI scheduling.  Recheck after MRI.

## 2019-08-28 NOTE — Patient Instructions (Addendum)
Please continue: - Metformin 1000 mg 2x a day with meals - Trulicity 3 mg weekly   Please increase: - Lantus 40 units after dinner  - Humalog 10-14 units 15 minutes before each of the 3 meals  Please let me know about the sugars in 1-2 weeks.  Please return in 3 months with your sugar log.

## 2019-08-28 NOTE — Progress Notes (Signed)
Subjective:     Patient ID: Denise Beard, female   DOB: 1961-03-09, 59 y.o.   MRN: [id number]  This visit occurred during the SARS-CoV-2 public health emergency.  Safety protocols were in place, including screening questions prior to the visit, additional usage of staff PPE, and extensive cleaning of exam room while observing appropriate contact time as indicated for disinfecting solutions.   Diabetes  Denise Beard is a pleasant 59 y.o. woman, returning for f/u for DM2, dx 2012, insulin-dependent, uncontrolled, without long-term complications. Last visit 4 months ago.  Since last OV, she is getting steroid inj's in shoulder (01 and 07/2019) for frozen shoulder + OA >> sugars MUCH higher.  She saw that they were in the 300s and 400s but did not realize that this is due to steroids and if she saw the prescribing doctor recently.  She is not going to continue with injections.  She may need surgery.  Reviewed her HbA1c levels: Lab Results  Component Value Date   HGBA1C 7.3 (A) 04/23/2019   HGBA1C 7.6 (H) 10/17/2018   HGBA1C 6.8 (A) 05/02/2018   She is on: - Metformin 1000 mg 2x a day with meals - Trulicity 1.5 >> 3 mg weekly  - Lantus 30 units after dinner  - Humalog 10 units 15 minutes before dinner We stopped glipizide due to dizziness. She was on saxagliptin/metformin XR 09/998 mg (Kombyglyze) in the past. She was on JanuMet 50/1000 bid in 2015 >> lightheaded, HA, blurry vision.  She checks sugars 1-4x a day: - am:  150-175 >> 122-163, 215 >> 150-170 >> 154, 193-492 - 2h after b'fast: 135-200, 239, 256 >> n/c >> 306, 377 - before lunch:  120-130 >> 117-133, 143 >> n/c >> 182-370 - 2h after lunch: 103 >> n/c >> 94 >> n/c >>> 180 - before dinner:  69 (?), 97-143 >> n/c >> 125 >> 152 - 2h after dinner:  180-190 >> 116-127, 165 >> n/c >> 181-336 - bedtime:  n/c >> 132-196 >> n/c >> 216-381 - nighttime: 109-188 >> n/c Lowest: 90 - felt this >> 69 >> 125 >> 154. Highest: 259 >> 200s -  Mtn dew >> 215 >> 254 - Txgiving >> 492  Meter:  One Touch ultra 2  She saw nutrition in the past.  No CKD; latest BUN/creatinine: Lab Results  Component Value Date   BUN 8 10/17/2018   CREATININE 0.67 10/17/2018   No MAU: Lab Results  Component Value Date   MICRALBCREAT 8 05/27/2018   MICRALBCREAT 0.9 05/24/2017   MICRALBCREAT 0.8 04/06/2016   MICRALBCREAT 2.4 02/04/2015   MICRALBCREAT 0.4 10/30/2013   MICRALBCREAT 18.2 07/17/2013   MICRALBCREAT 1.9 10/06/2012   MICRALBCREAT 1.5 07/15/2012   MICRALBCREAT 1.4 02/26/2011   + HL: Lipids:  Lab Results  Component Value Date   CHOL 141 10/17/2018   HDL 38.10 (L) 10/17/2018   LDLCALC 76 10/17/2018   LDLDIRECT 179.6 07/17/2013   TRIG 133.0 10/17/2018   CHOLHDL 4 10/17/2018  On Zocor.  - Last eye exam 04/2019: No Denise reportedly (Denise Beard - [facility]). -No numbness or tingling in her feet.  She also has a history of anxiety, asthma, GERD, constipation; polyarthropathy; seizure disorder.   Review of Systems Constitutional: no weight gain/no weight loss, no fatigue, no subjective hyperthermia, no subjective hypothermia Eyes: no blurry vision, no xerophthalmia ENT: no sore throat, no nodules palpated in neck, no dysphagia, no odynophagia, no hoarseness Cardiovascular: no CP/no SOB/no palpitations/no leg swelling  Respiratory: no cough/no SOB/no wheezing Gastrointestinal: no N/no V/+ D/no C/no acid reflux Musculoskeletal: no muscle aches/+ joint aches Skin: no rashes, no hair loss Neurological: no tremors/no numbness/no tingling/no dizziness  I reviewed pt's medications, allergies, PMH, social hx, family hx, and changes were documented in the history of present illness. Otherwise, unchanged from my initial visit note.  Past Medical History:  Diagnosis Date  . ANXIETY 03/17/2007   in past  . ASTHMA 06/26/2007  . DIAB W/O COMP TYPE II/UNS NOT STATED UNCNTRL 04/06/2009  . GERD 06/26/2007  . Irritable bowel  syndrome 02/27/2010   constipation  . OA (osteoarthritis) 12/13/2009  . SEIZURE DISORDER 04/17/2010   in past /over 4 years ago/no meds. actually vasovagal per neuro notes   Past Surgical History:  Procedure Laterality Date  . ABDOMINAL HYSTERECTOMY    . CESAREAN SECTION    . TUBAL LIGATION     Social History   Socioeconomic History  . Marital status: Divorced    Spouse name: Not on file  . Number of children: Not on file  . Years of education: Not on file  . Highest education level: Not on file  Occupational History  . Not on file  Tobacco Use  . Smoking status: Never Smoker  . Smokeless tobacco: Never Used  Substance and Sexual Activity  . Alcohol use: No  . Drug use: No  . Sexual activity: Not on file  Other Topics Concern  . Not on file  Social History Narrative   Divorced/Single. 2 chldren. 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 Difficulty of Paying Living Expenses:   Food Insecurity:   . Worried About Charity fundraiser in the Last Year:   . Arboriculturist in the Last Year:   Transportation Needs:   . Film/video editor (Medical):   Marland Kitchen Lack of Transportation (Non-Medical):   Physical Activity:   . Days of Exercise per Week:   . Minutes of Exercise per Session:   Stress:   . Feeling of Stress :   Social Connections:   . Frequency of Communication with Friends and Family:   . Frequency of Social Gatherings with Friends and Family:   . Attends Religious Services:   . Active Member of Clubs or Organizations:   . Attends Archivist Meetings:   Marland Kitchen Marital Status:   Intimate Partner Violence:   . Fear of Current or Ex-Partner:   . Emotionally Abused:   Marland Kitchen Physically Abused:   . Sexually Abused:    Current Outpatient Medications on File Prior to Visit   Medication Sig Dispense Refill  . azelastine (ASTELIN) 0.1 % nasal spray Place 2 sprays into both nostrils 2 (two) times daily. 30 mL 12  . Dulaglutide (TRULICITY) 3 0000000 SOPN Inject 3 mg into the skin once a week. 12 pen 3  . insulin lispro (HUMALOG KWIKPEN) 100 UNIT/ML KwikPen Inject 0.1 mLs (10 Units total) into the skin daily before supper. 15 mL 5  . Insulin Pen Needle (BD PEN NEEDLE NANO U/F) 32G X 4 MM MISC USE ONCE DAILY AS DIRECTED 100 each 2  . LANTUS SOLOSTAR 100 UNIT/ML Solostar Pen INJECT 30 UNITS INTO THE SKIN DAILY AT 10 PM. 15  mL 4  . metFORMIN (GLUCOPHAGE) 1000 MG tablet Take 1 tablet (1,000 mg total) by mouth 2 (two) times daily with a meal. 180 tablet 3  . simvastatin (ZOCOR) 20 MG tablet TAKE 1 TABLET BY MOUTH EVERYDAY AT BEDTIME 90 tablet 3  . zolpidem (AMBIEN) 10 MG tablet Take 1 tablet (10 mg total) by mouth at bedtime as needed for sleep. 30 tablet 5   No current facility-administered medications on file prior to visit.   Allergies  Allergen Reactions  . Iodine     Per the patient. Had an oral solution in the 90's that gave her a bad reaction of hallucinations.    Family History  Problem Relation Age of Onset  . Thyroid disease Mother   . Hyperlipidemia Mother   . Breast cancer Mother 17  . Skin cancer Father        not melanoma- passed at 23  . Colon cancer Maternal Uncle   . Stomach cancer Maternal Uncle   . Lung cancer Maternal Uncle   . Cancer Maternal Grandfather        unknown    Objective:   Physical Exam  BP 118/70   Pulse 90   Ht 5\' 5"  (1.651 m)   Wt 160 lb (72.6 kg)   SpO2 96%   BMI 26.63 kg/m  Body mass index is 26.63 kg/m.  Wt Readings from Last 3 Encounters:  08/28/19 160 lb (72.6 kg)  08/28/19 161 lb (73 kg)  07/17/19 162 lb (73.5 kg)   Constitutional: overweight, in NAD Eyes: PERRLA, EOMI, no exophthalmos ENT: moist mucous membranes, no thyromegaly, no cervical lymphadenopathy Cardiovascular: RRR, No MRG Respiratory: CTA  B Gastrointestinal: abdomen soft, NT, ND, BS+ Musculoskeletal: no deformities, strength intact in all 4 Skin: moist, warm, no rashes Neurological: no tremor with outstretched hands, DTR normal in all 4   Assessment:     1. DM2, insulin-dependent, uncontrolled, without long term complications, but with hyperglycemia  Type 1 diabetes investigation was negative: Component     Latest Ref Rng & Units 01/24/2018  ZNT8 Antibodies     U/mL <15  Islet Cell Ab     Neg:<1:1 Negative  Glutamic Acid Decarb Ab     <5 IU/mL <5  C-Peptide     0.80 - 3.85 ng/mL 2.07  Glucose, Plasma     65 - 99 mg/dL 134 (H)   2. HL  3.  Overweight    Plan:     Pt with longstanding, uncontrolled, type 2 diabetes, on basal/bolus insulin regimen, Metformin, and weekly GLP-1 receptor agonist.  At last visit, sugars were high in the morning and she was snacking at night and we discussed the need to stop.  She was not checking sugars later in the day and I advised her to start.  We increase the Trulicity dose but kept this same doses of insulin.  At that time, her HbA1c was 7.3%, improved. -At this visit, however, her sugars are much higher, the majority and 200-400 range, after 2 steroid injections in her shoulder.  She did not realize that these are causing her higher blood sugars.  She is in a way relieved that this was the cause for the sudden poor glycemic control.  She will not get the steroid injections anymore. -At this visit, we discussed that the steroids can increase her sugars after meals.  Therefore, we will increase the Humalog to 10 to 14 units before every meal, as she is now taking it only  before dinner.  We will also increase the Lantus to 40 units.  We discussed that after her steroids are out of her system, we most likely can slowly start decreasing these doses to the previous ones.  For now we will continue the same dose of Metformin and Trulicity.  She is tolerating the higher Trulicity dose well.  She  does have diarrhea after meals, but it is possible that this is related to her very high blood sugars and the steroids. - I advised her to: Patient Instructions  Please continue: - Metformin 1000 mg 2x a day with meals - Trulicity 3 mg weekly   Please increase: - Lantus 40 units after dinner  - Humalog 10-14 units 15 minutes before each of the 3 meals  Please let me know about the sugars in 1-2 weeks.  Please return in 3 months with your sugar log.   - we checked her HbA1c: 10.2% (much higher) - advised to check sugars at different times of the day - 3x a day, rotating check times - advised for yearly eye exams >> she is UTD - return to clinic in 3 months  2. HL  -Reviewed lipid panel from 09/2018: LDL at goal, HDL low Lab Results  Component Value Date   CHOL 141 10/17/2018   HDL 38.10 (L) 10/17/2018   LDLCALC 76 10/17/2018   LDLDIRECT 179.6 07/17/2013   TRIG 133.0 10/17/2018   CHOLHDL 4 10/17/2018  -Continue Zocor without side effects  3.  Overweight -At last visit we increased the dose of Trulicity.  This should also help with weight loss -Unfortunately, we need to increase her insulin doses for now which can cause weight gain,however, I expect that this is a transient measure -She lost 4 pounds since last visit  Philemon Kingdom, MD PhD [place] [facility]

## 2019-09-01 ENCOUNTER — Ambulatory Visit: Payer: Commercial Managed Care - PPO | Attending: Family

## 2019-09-01 DIAGNOSIS — Z23 Encounter for immunization: Secondary | ICD-10-CM

## 2019-09-01 NOTE — Progress Notes (Signed)
   Covid-19 Vaccination Clinic  Name:  Denise Beard    MRN: [id number] DOB: Jul 25, 1960  09/01/2019  [name] was observed post Covid-19 immunization for 15 minutes without incident. She was provided with Vaccine Information Sheet and instruction to access the V-Safe system.   [name] was instructed to call 911 with any severe reactions post vaccine: Marland Kitchen Difficulty breathing  . Swelling of face and throat  . A fast heartbeat  . A bad rash all over body  . Dizziness and weakness   Immunizations Administered    Name Date Dose VIS Date Route   Moderna COVID-19 Vaccine 09/01/2019 12:55 PM 0.5 mL 04/21/2019 Intramuscular   Manufacturer: Moderna   Lot: [id number]   [id number]

## 2019-09-30 ENCOUNTER — Other Ambulatory Visit: Payer: Commercial Managed Care - PPO

## 2019-10-05 ENCOUNTER — Other Ambulatory Visit: Payer: Self-pay | Admitting: Internal Medicine

## 2019-10-13 ENCOUNTER — Telehealth: Payer: Self-pay | Admitting: Internal Medicine

## 2019-10-13 NOTE — Telephone Encounter (Signed)
Please see message and advise 

## 2019-10-13 NOTE — Telephone Encounter (Signed)
Patient states since her medication changed last time she was here she's had swelling in her feet. Please advise. [phone]

## 2019-10-13 NOTE — Telephone Encounter (Signed)
We increased her insulins at that time as sugars are very high.  Occasionally, insulin can cause slight fluid retention.  If her sugars are better, we can try to decrease the doses by several units.  If the sugars are not better, I would not recommend to do so.

## 2019-10-14 NOTE — Patient Instructions (Addendum)
Health Maintenance Due  Topic Date Due  . PNEUMOCOCCAL POLYSACCHARIDE VACCINE AGE 57-64 HIGH RISK  Never done will get today if needed.   Marland Kitchen URINE MICROALBUMIN  05/28/2019     Please stop by lab before you go If you have mychart- we will send your results within 3 business days of [place] receiving them.  If you do not have mychart- we will call you about results within 5 business days of [place] receiving them.   Cholesterol: we will check your cholesterol today if LDL stays below 100 we will not make any changes in medications. Continue to work on increasing your exercise.    Diabetes: Try and continue home blood sugar [place] so that we can make sure your numbers are trending down.    Insomnia: continue current medications.   Swelling: we are going to check some labs today. Recommended that you try compression socks. Try to get up and walk around more to help with the blood flow.   Grief:  A amazing resource for grief is the local hospice. They have a lot of amazing different options that may be helpful for yourself as well as your family.   https://www.[facility].org/grief-support/grief-support-for-adults/grief-resources-for-adults   We have made appointment for your physical next year. Call us if anything comes up before then.
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
## 2019-10-23 ENCOUNTER — Encounter: Payer: Self-pay | Admitting: Family Medicine

## 2019-10-23 ENCOUNTER — Ambulatory Visit (INDEPENDENT_AMBULATORY_CARE_PROVIDER_SITE_OTHER): Payer: Commercial Managed Care - PPO | Admitting: Family Medicine

## 2019-10-23 ENCOUNTER — Other Ambulatory Visit: Payer: Self-pay

## 2019-10-23 VITALS — BP 108/68 | HR 82 | Temp 97.0°F | Ht 65.0 in | Wt 164.0 lb

## 2019-10-23 DIAGNOSIS — Z23 Encounter for immunization: Secondary | ICD-10-CM

## 2019-10-23 DIAGNOSIS — Z1283 Encounter for screening for malignant neoplasm of skin: Secondary | ICD-10-CM

## 2019-10-23 DIAGNOSIS — E785 Hyperlipidemia, unspecified: Secondary | ICD-10-CM | POA: Diagnosis not present

## 2019-10-23 DIAGNOSIS — E1169 Type 2 diabetes mellitus with other specified complication: Secondary | ICD-10-CM | POA: Diagnosis not present

## 2019-10-23 DIAGNOSIS — E119 Type 2 diabetes mellitus without complications: Secondary | ICD-10-CM | POA: Diagnosis not present

## 2019-10-23 DIAGNOSIS — Z0000 Encounter for general adult medical examination without abnormal findings: Secondary | ICD-10-CM

## 2019-10-23 DIAGNOSIS — J4540 Moderate persistent asthma, uncomplicated: Secondary | ICD-10-CM | POA: Diagnosis not present

## 2019-10-23 DIAGNOSIS — G4700 Insomnia, unspecified: Secondary | ICD-10-CM

## 2019-10-23 DIAGNOSIS — Z79899 Other long term (current) drug therapy: Secondary | ICD-10-CM

## 2019-10-23 LAB — CBC WITH DIFFERENTIAL/PLATELET
Basophils Absolute: 0 10*3/uL (ref 0.0–0.1)
Basophils Relative: 0.6 % (ref 0.0–3.0)
Eosinophils Absolute: 0.3 10*3/uL (ref 0.0–0.7)
Eosinophils Relative: 3.7 % (ref 0.0–5.0)
HCT: 36 % (ref 36.0–46.0)
Hemoglobin: 11.9 g/dL — ABNORMAL LOW (ref 12.0–15.0)
Lymphocytes Relative: 41.8 % (ref 12.0–46.0)
Lymphs Abs: 3.3 10*3/uL (ref 0.7–4.0)
MCHC: 33 g/dL (ref 30.0–36.0)
MCV: 90.4 fl (ref 78.0–100.0)
Monocytes Absolute: 0.6 10*3/uL (ref 0.1–1.0)
Monocytes Relative: 7.3 % (ref 3.0–12.0)
Neutro Abs: 3.7 10*3/uL (ref 1.4–7.7)
Neutrophils Relative %: 46.6 % (ref 43.0–77.0)
Platelets: 282 10*3/uL (ref 150.0–400.0)
RBC: 3.98 Mil/uL (ref 3.87–5.11)
RDW: 13.6 % (ref 11.5–15.5)
WBC: 7.9 10*3/uL (ref 4.0–10.5)

## 2019-10-23 LAB — COMPREHENSIVE METABOLIC PANEL
ALT: 18 U/L (ref 0–35)
AST: 15 U/L (ref 0–37)
Albumin: 4.1 g/dL (ref 3.5–5.2)
Alkaline Phosphatase: 97 U/L (ref 39–117)
BUN: 6 mg/dL (ref 6–23)
CO2: 28 mEq/L (ref 19–32)
Calcium: 9.9 mg/dL (ref 8.4–10.5)
Chloride: 104 mEq/L (ref 96–112)
Creatinine, Ser: 0.59 mg/dL (ref 0.40–1.20)
GFR: 126.94 mL/min (ref >60.00)
Glucose, Bld: 199 mg/dL — ABNORMAL HIGH (ref 70–99)
Potassium: 4 mEq/L (ref 3.5–5.1)
Sodium: 140 mEq/L (ref 135–145)
Total Bilirubin: 0.4 mg/dL (ref 0.2–1.2)
Total Protein: 7 g/dL (ref 6.0–8.3)

## 2019-10-23 LAB — MICROALBUMIN / CREATININE URINE RATIO
Creatinine,U: 109.6 mg/dL
Microalb Creat Ratio: 1.1 mg/g (ref 0.0–30.0)
Microalb, Ur: 1.2 mg/dL (ref 0.0–1.9)

## 2019-10-23 LAB — TSH: TSH: 1.62 u[IU]/mL (ref 0.35–4.50)

## 2019-10-23 LAB — LIPID PANEL
Cholesterol: 143 mg/dL (ref 0–200)
HDL: 40.2 mg/dL (ref >39.00)
LDL Cholesterol: 82 mg/dL (ref 0–99)
NonHDL: 102.59
Total CHOL/HDL Ratio: 4
Triglycerides: 104 mg/dL (ref 0.0–149.0)
VLDL: 20.8 mg/dL (ref 0.0–40.0)

## 2019-10-23 MED ORDER — ZOLPIDEM TARTRATE 10 MG PO TABS: 10.0000 mg | ORAL_TABLET | Freq: Every evening | ORAL | 5 refills | Status: DC | PRN

## 2019-10-23 MED ORDER — AZELASTINE HCL 0.1 % NA SOLN: 2.0000 | Freq: Two times a day (BID) | NASAL | 12 refills | Status: AC

## 2019-10-23 MED ORDER — SIMVASTATIN 20 MG PO TABS: ORAL_TABLET | ORAL | 3 refills | Status: DC

## 2019-10-26 ENCOUNTER — Other Ambulatory Visit: Payer: Self-pay

## 2019-10-26 DIAGNOSIS — D649 Anemia, unspecified: Secondary | ICD-10-CM

## 2019-11-03 ENCOUNTER — Encounter: Payer: Self-pay | Admitting: Family Medicine

## 2019-11-27 ENCOUNTER — Ambulatory Visit: Payer: Commercial Managed Care - PPO | Admitting: Internal Medicine

## 2019-12-07 ENCOUNTER — Other Ambulatory Visit: Payer: Self-pay | Admitting: Internal Medicine

## 2019-12-18 ENCOUNTER — Ambulatory Visit
Admission: EM | Admit: 2019-12-18 | Discharge: 2019-12-18 | Disposition: A | Discharge status: Home / Self Care | Payer: Commercial Managed Care - PPO | Attending: Nurse Practitioner | Admitting: Nurse Practitioner

## 2019-12-18 ENCOUNTER — Telehealth: Payer: Commercial Managed Care - PPO | Admitting: Family Medicine

## 2019-12-18 ENCOUNTER — Telehealth: Payer: Self-pay | Admitting: Family Medicine

## 2019-12-18 ENCOUNTER — Ambulatory Visit (INDEPENDENT_AMBULATORY_CARE_PROVIDER_SITE_OTHER): Payer: Commercial Managed Care - PPO

## 2019-12-18 ENCOUNTER — Other Ambulatory Visit: Payer: Self-pay

## 2019-12-18 DIAGNOSIS — R062 Wheezing: Secondary | ICD-10-CM | POA: Diagnosis not present

## 2019-12-18 DIAGNOSIS — R0602 Shortness of breath: Secondary | ICD-10-CM

## 2019-12-18 DIAGNOSIS — J209 Acute bronchitis, unspecified: Secondary | ICD-10-CM | POA: Diagnosis not present

## 2019-12-18 DIAGNOSIS — Z1152 Encounter for screening for COVID-19: Secondary | ICD-10-CM

## 2019-12-18 DIAGNOSIS — Z20822 Contact with and (suspected) exposure to covid-19: Secondary | ICD-10-CM | POA: Diagnosis not present

## 2019-12-18 DIAGNOSIS — R05 Cough: Secondary | ICD-10-CM

## 2019-12-18 MED ORDER — CEFDINIR 300 MG PO CAPS
300.0000 mg | ORAL_CAPSULE | Freq: Two times a day (BID) | ORAL | 0 refills | Status: AC
Start: 2019-12-18 — End: 2019-12-25

## 2019-12-18 MED ORDER — PREDNISONE 20 MG PO TABS
20.0000 mg | ORAL_TABLET | Freq: Every day | ORAL | 0 refills | Status: AC
Start: 2019-12-18 — End: 2019-12-23

## 2019-12-18 MED ORDER — DM-GUAIFENESIN ER 30-600 MG PO TB12: 1.0000 | ORAL_TABLET | Freq: Two times a day (BID) | ORAL | 0 refills | Status: AC

## 2019-12-18 MED ORDER — PROMETHAZINE-DM 6.25-15 MG/5ML PO SYRP
5.0000 mL | ORAL_SOLUTION | Freq: Four times a day (QID) | ORAL | 0 refills | Status: DC | PRN
Start: 2019-12-18 — End: 2020-04-08

## 2019-12-18 NOTE — Telephone Encounter (Signed)
Patient was seen in [place] Nurse: Wynetta Emery, RN, Baker Janus Date/Time Eilene Ghazi Time): 12/18/2019 8:24:37 AM Confirm and document reason for call. If symptomatic, describe symptoms. ---Denise Beard had cough and congestion two weeks ago; took OTC medication s/sx subsided -- symptoms returned yesterday, cough congestion and shortness of breath. no fever at this time. feels bronchial. Has the patient had close contact with a person known or suspected to have the novel coronavirus illness OR traveled / lives in area with major community spread (including international travel) in the last 14 days from the onset of symptoms? * If Asymptomatic, screen for exposure and travel within the last 14 days. ---No Does the patient have any new or worsening symptoms? ---Yes Will a triage be completed? ---Yes Related visit to physician within the last 2 weeks? ---No Does the PT have any chronic conditions? (i.e. diabetes, asthma, this includes High risk factors for pregnancy, etc.) ---Yes List chronic conditions. ---Asthma Diabetes 1 -insulin dependent Hyperlipidemia Is this a behavioral health or substance abuse call? ---No Guidelines Guideline Title Affirmed Question Affirmed Notes Nurse Date/Time (Eastern Time) Cough - Chronic [1] MILD difficulty breathing (e.g., minimal/ no SOB at rest, SOB with walking, pulse <100) Wynetta Emery, Lolita Cram 12/18/2019 8:28:20 AMPLEASE NOTE: All timestamps contained within this report are represented as [place] Standard Time. CONFIDENTIALTY NOTICE: This fax transmission is intended only for the addressee. It contains information that is legally privileged, confidential or otherwise protected from use or disclosure. If you are not the intended recipient, you are strictly prohibited from reviewing, disclosing, copying using or disseminating any of this information or taking any action in reliance on or regarding this information. If you have received this fax in  error, please notify us immediately by telephone so that we can arrange for its return to [place]. Phone: [phone], Toll-Free: [phone], Fax: [phone] Page: 2 of 2 Call Id: [id number] Guidelines Guideline Title Affirmed Question Affirmed Notes Nurse Date/Time Eilene Ghazi Time) AND [2] still present when not coughing (Exception: no change from usual, chronic shortness of breath) Disp. Time Eilene Ghazi Time) Disposition Final User 12/18/2019 8:23:00 AM Send to Urgent Queue Dory Larsen 12/18/2019 8:32:29 AM See HCP within 4 Hours (or PCP triage) Yes Wynetta Emery, RN, Christin Bach Disagree/Comply Comply Caller Understands Yes PreDisposition Call Doctor Care Advice Given Per Guideline SEE HCP WITHIN 4 HOURS (OR PCP TRIAGE): * You become worse. Comments User: Michele Rockers, RN Date/Time Eilene Ghazi Time): 12/18/2019 8:37:41 AM RN NOTE; [facility]line called to warm transfer patient over for appt. unable to get through; called main line and will offer virtual appt to her Referrals REFERRED TO PCP OFFICE

## 2019-12-18 NOTE — ED Triage Notes (Signed)
Pt c/o SOB, chest congestion and cough for 3wks that went away and returned 2days ago. Pt c/o tightness to chest and back on inspiration. No distress, speaking in complete sentences.

## 2019-12-18 NOTE — Telephone Encounter (Signed)
FYI

## 2019-12-18 NOTE — ED Provider Notes (Signed)
[name]    CSN: [id number] Arrival date & time: 12/18/19  7341      History   Chief Complaint Chief Complaint  Patient presents with  . Shortness of Breath    HPI Denise Beard is a 59 y.o. female.   Subjective:  Denise Beard is a 59 y.o. female who presents for evaluation of symptoms of a URI. Symptoms include chest congestion, chest pain during cough, shortness of breath, dry cough, headache, sore throat and wheezing.  Denies any fevers, body aches, vomiting, dizziness or change in taste/smell.  Onset of symptoms was 1 month ago and have been waxing and waning since that time. She is drinking plenty of fluids. No prior evaluation for symptoms. Treatment to date include albuterol inhaler and mucinex with some relief in her symptoms but only lasts temporarily. She is a non-smoker. She denies any known COVID exposure or sick contacts. She completed her COVID vaccination in April 2021. She has no history of COVID-19.   The following portions of the patient's history were reviewed and updated as appropriate: allergies, current medications, past family history, past medical history, past social history, past surgical history and problem list.        Past Medical History:  Diagnosis Date  . ANXIETY 03/17/2007   in past  . ASTHMA 06/26/2007  . DIAB W/O COMP TYPE II/UNS NOT STATED UNCNTRL 04/06/2009  . GERD 06/26/2007  . Irritable bowel syndrome 02/27/2010   constipation  . OA (osteoarthritis) 12/13/2009  . SEIZURE DISORDER 04/17/2010   in past /over 4 years ago/no meds. actually vasovagal per neuro notes    Patient Active Problem List   Diagnosis Date Noted  . Osteoarthritis 02/04/2015  . Vasovagal syncope 02/04/2015  . Hyperlipidemia associated with type 2 diabetes mellitus ([place]) 02/04/2015  . Allergic rhinitis 02/04/2015  . Insomnia 02/04/2015  . Irritable bowel syndrome 02/27/2010  . Type II diabetes mellitus, well controlled ([place]) 04/06/2009  .  Asthma 06/26/2007  . GERD 06/26/2007    Past Surgical History:  Procedure Laterality Date  . ABDOMINAL HYSTERECTOMY    . CESAREAN SECTION    . TUBAL LIGATION      OB History   No obstetric history on file.      Home Medications    Prior to Admission medications   Medication Sig Start Date End Date Taking? Authorizing Provider  azelastine (ASTELIN) 0.1 % nasal spray Place 2 sprays into both nostrils 2 (two) times daily. 10/23/19   Marin Olp, MD  BD PEN NEEDLE NANO 2ND GEN 32G X 4 MM MISC USE ONCE DAILY AS DIRECTED 12/07/19   Philemon Kingdom, MD  cefdinir (OMNICEF) 300 MG capsule Take 1 capsule (300 mg total) by mouth 2 (two) times daily for 7 days. 12/18/19 12/25/19  Enrique Sack, FNP  dextromethorphan-guaiFENesin (MUCINEX DM) 30-600 MG 12hr tablet Take 1 tablet by mouth 2 (two) times daily for 10 days. 12/18/19 12/28/19  Enrique Sack, FNP  Dulaglutide (TRULICITY) 3 PF/7.9KW SOPN Inject 3 mg into the skin once a week. 04/23/19   Philemon Kingdom, MD  HUMALOG KWIKPEN 100 UNIT/ML KwikPen INJECT(10 UNITS TOTAL) INTO THE SKIN DAILY BEFORE SUPPER. 10/05/19   Philemon Kingdom, MD  insulin glargine (LANTUS SOLOSTAR) 100 UNIT/ML Solostar Pen INJECT 30-40 UNITS INTO THE SKIN DAILY AT 10 PM. 08/28/19   Philemon Kingdom, MD  metFORMIN (GLUCOPHAGE) 1000 MG tablet TAKE 1 TABLET BY MOUTH TWICE A DAY WITH MEALS 10/05/19   Philemon Kingdom, MD  predniSONE (  DELTASONE) 20 MG tablet Take 1 tablet (20 mg total) by mouth daily for 5 days. 12/18/19 12/23/19  Enrique Sack, FNP  promethazine-dextromethorphan (PROMETHAZINE-DM) 6.25-15 MG/5ML syrup Take 5 mLs by mouth 4 (four) times daily as needed for cough. 12/18/19   Enrique Sack, FNP  simvastatin (ZOCOR) 20 MG tablet TAKE 1 TABLET BY MOUTH EVERYDAY AT BEDTIME 10/23/19   Marin Olp, MD  zolpidem (AMBIEN) 10 MG tablet Take 1 tablet (10 mg total) by mouth at bedtime as needed for sleep. 10/23/19   Marin Olp, MD    Family  History Family History  Problem Relation Age of Onset  . Thyroid disease Mother   . Hyperlipidemia Mother   . Breast cancer Mother 17  . Skin cancer Father        not melanoma- passed at 42  . Colon cancer Maternal Uncle   . Stomach cancer Maternal Uncle   . Lung cancer Maternal Uncle   . Cancer Maternal Grandfather        unknown    Social History Social History   Tobacco Use  . Smoking status: Never Smoker  . Smokeless tobacco: Never Used  Substance Use Topics  . Alcohol use: No  . Drug use: No     Allergies   Iodine   Review of Systems Review of Systems  Constitutional: Positive for fatigue. Negative for fever.  HENT: Positive for sore throat.   Respiratory: Positive for cough, shortness of breath and wheezing.   Cardiovascular: Positive for chest pain.  Gastrointestinal: Positive for nausea. Negative for vomiting.  Musculoskeletal: Negative for myalgias.  Skin: Negative for rash.  Neurological: Positive for headaches. Negative for dizziness.  All other systems reviewed and are negative.    Physical Exam Triage Vital Signs ED Triage Vitals [12/18/19 1006]  Enc Vitals Group     BP (!) 135/87     Pulse Rate 80     Resp 18     Temp 98.5 F (36.9 C)     Temp Source Oral     SpO2 96 %     Weight      Height      Head Circumference      Peak Flow      Pain Score 4     Pain Loc      Pain Edu?      Excl. in [place]?    No data found.  Updated Vital Signs BP (!) 135/87 (BP Location: Left Arm)   Pulse 80   Temp 98.5 F (36.9 C) (Oral)   Resp 18   SpO2 96%   Visual Acuity Right Eye Distance:   Left Eye Distance:   Bilateral Distance:    Right Eye Near:   Left Eye Near:    Bilateral Near:     Physical Exam   UC Treatments / Results  Labs (all labs ordered are listed, but only abnormal results are displayed) Labs Reviewed - No data to display  EKG   Radiology DG Chest 2 View  Result Date: 12/18/2019 CLINICAL DATA:  Cough, shortness of  breath, wheezing EXAM: CHEST - 2 VIEW COMPARISON:  06/02/2019 FINDINGS: The heart size and mediastinal contours are within normal limits. Both lungs are clear. The visualized skeletal structures are unremarkable. IMPRESSION: Normal study. Electronically Signed   By: Rolm Baptise M.D.   On: 12/18/2019 10:28    Procedures Procedures (including critical care time)  Medications Ordered in UC Medications - No data to display  Initial Impression / Assessment and Plan / UC Course  I have reviewed the triage vital signs and the nursing notes.  Pertinent labs & imaging results that were available during my care of the patient were reviewed by me and considered in my medical decision making (see chart for details).    59 yo female presenting with a one month history of chest congestion, shortness of breath, chest pain during cough, dry cough, headache, sore throat and wheezing.  Symptoms have been waxing and waning since onset.  She has tried some over-the-counter meds and her albuterol daily which has provided some temporary relief in her symptoms.  Denies any fevers. CXR unremarkable.  No known Covid exposure.  No known history of COVID-19.  She is completely vaccinated against COVID-19.  Plan:  Discussed diagnosis and treatment of URI. Nasal saline spray for congestion. Follow up as needed with PCP as needed   Today's evaluation has revealed no signs of a dangerous process. Discussed diagnosis with patient and/or guardian. Patient and/or guardian aware of their diagnosis, possible red flag symptoms to watch out for and need for close follow up. Patient and/or guardian understands verbal and written discharge instructions. Patient and/or guardian comfortable with plan and disposition.  Patient and/or guardian has a clear mental status at this time, good insight into illness (after discussion and teaching) and has clear judgment to make decisions regarding their care  This care was provided during an  unprecedented National Emergency due to the Novel Coronavirus (COVID-19) pandemic. COVID-19 infections and transmission risks place heavy strains on healthcare resources.  As this pandemic evolves, our facility, providers, and staff strive to respond fluidly, to remain operational, and to provide care relative to available resources and information. Outcomes are unpredictable and treatments are without well-defined guidelines. Further, the impact of COVID-19 on all aspects of urgent care, including the impact to patients seeking care for reasons other than COVID-19, is unavoidable during this national emergency. At this time of the global pandemic, management of patients has significantly changed, even for non-COVID positive patients given high local and regional COVID volumes at this time requiring high healthcare system and resource utilization. The standard of care for management of both COVID suspected and non-COVID suspected patients continues to change rapidly at the local, regional, national, and global levels. This patient was worked up and treated to the best available but ever changing evidence and resources available at this current time.   Documentation was completed with the aid of voice recognition software. Transcription may contain typographical errors.  Final Clinical Impressions(s) / UC Diagnoses   Final diagnoses:  Acute bronchitis, unspecified organism  Encounter for screening laboratory testing for COVID-19 virus     Discharge Instructions     Take medications as prescribed.  Monitor your blood sugars closely since she will be on steroids for the next couple of days.  You may take tylenol or ibuprofen as needed for fevers/headache/body aches. Drink plenty of fluids. Stay in home isolation until you receive results of your COVID test. You will only be notified for positive results. You may go online to MyChart in the next few days and review your results. Please follow CDC  guidelines that are attached. You may discontinue home isolation when there has been at least 10 days since symptoms onset AND 3 days fever free without antipyretics (Tylenol or Ibuprofen) AND an overall improvement in your symptoms. Go to the ED immediately if you get worse or have any other symptoms.  Feel better soon!  Aldona Bar, FNP-C      ED Prescriptions    Medication Sig Dispense Auth. Provider   cefdinir (OMNICEF) 300 MG capsule Take 1 capsule (300 mg total) by mouth 2 (two) times daily for 7 days. 14 capsule Enrique Sack, FNP   predniSONE (DELTASONE) 20 MG tablet Take 1 tablet (20 mg total) by mouth daily for 5 days. 5 tablet Enrique Sack, FNP   promethazine-dextromethorphan (PROMETHAZINE-DM) 6.25-15 MG/5ML syrup Take 5 mLs by mouth 4 (four) times daily as needed for cough. 118 mL Verena Shawgo, Aldona Bar, FNP   dextromethorphan-guaiFENesin [facility] DM) 30-600 MG 12hr tablet Take 1 tablet by mouth 2 (two) times daily for 10 days. 20 tablet Enrique Sack, FNP     PDMP not reviewed this encounter.   Enrique Sack, [place] 12/18/19 1042

## 2019-12-18 NOTE — Discharge Instructions (Addendum)
Take medications as prescribed.  Monitor your blood sugars closely since she will be on steroids for the next couple of days.  You may take tylenol or ibuprofen as needed for fevers/headache/body aches. Drink plenty of fluids. Stay in home isolation until you receive results of your COVID test. You will only be notified for positive results. You may go online to MyChart in the next few days and review your results. Please follow CDC guidelines that are attached. You may discontinue home isolation when there has been at least 10 days since symptoms onset AND 3 days fever free without antipyretics (Tylenol or Ibuprofen) AND an overall improvement in your symptoms. Go to the ED immediately if you get worse or have any other symptoms.   Feel better soon!  Aldona Bar, FNP-C

## 2019-12-19 LAB — NOVEL CORONAVIRUS, NAA: SARS-CoV-2, NAA: NOT DETECTED

## 2019-12-19 LAB — SARS-COV-2, NAA 2 DAY TAT

## 2019-12-19 NOTE — Telephone Encounter (Signed)
She was seen in emergency room or urgent care it appears

## 2020-02-26 ENCOUNTER — Encounter: Payer: Self-pay | Admitting: Internal Medicine

## 2020-02-26 ENCOUNTER — Ambulatory Visit (INDEPENDENT_AMBULATORY_CARE_PROVIDER_SITE_OTHER): Payer: Commercial Managed Care - PPO | Admitting: Internal Medicine

## 2020-02-26 ENCOUNTER — Other Ambulatory Visit: Payer: Self-pay

## 2020-02-26 VITALS — BP 142/80 | HR 96 | Ht 65.0 in | Wt 167.8 lb

## 2020-02-26 DIAGNOSIS — E119 Type 2 diabetes mellitus without complications: Secondary | ICD-10-CM | POA: Diagnosis not present

## 2020-02-26 DIAGNOSIS — E663 Overweight: Secondary | ICD-10-CM

## 2020-02-26 DIAGNOSIS — E785 Hyperlipidemia, unspecified: Secondary | ICD-10-CM | POA: Diagnosis not present

## 2020-02-26 LAB — POCT GLYCOSYLATED HEMOGLOBIN (HGB A1C): Hemoglobin A1C: 10.2 % — AB (ref 4.0–5.6)

## 2020-02-26 MED ORDER — LANTUS SOLOSTAR 100 UNIT/ML ~~LOC~~ SOPN: PEN_INJECTOR | SUBCUTANEOUS | 3 refills | Status: DC

## 2020-02-26 MED ORDER — TRULICITY 3 MG/0.5ML ~~LOC~~ SOAJ: 3.0000 mg | SUBCUTANEOUS | 3 refills | Status: DC

## 2020-02-26 NOTE — Progress Notes (Signed)
Subjective:     Patient ID: Denise Beard, female   DOB: 1961/05/08, 59 y.o.   MRN: [id number]  This visit occurred during the SARS-CoV-2 public health emergency.  Safety protocols were in place, including screening questions prior to the visit, additional usage of staff PPE, and extensive cleaning of exam room while observing appropriate contact time as indicated for disinfecting solutions.   Diabetes  Denise Beard is a pleasant 59 y.o. woman, returning for f/u for DM2, dx 2012, insulin-dependent, uncontrolled, without long-term complications. Last visit 6 months ago.  Before last visit, she is getting steroid inj's in shoulder (01 and 07/2019) for frozen shoulder + OA >> sugars in the 300s and 400s.  HbA1c returned very high.  Since then, she stopped the steroid injections.  At this visit, she had a lot of stress since last visit, and she was not checking sugars or taking Humalog consistently.  For example, she has been off Humalog completely for the last 2 days.  Reviewed HbA1c levels: Lab Results  Component Value Date   HGBA1C 10.2 (A) 08/28/2019   HGBA1C 7.3 (A) 04/23/2019   HGBA1C 7.6 (H) 10/17/2018   She is on - Metformin 1000 mg 2x a day with meals - Trulicity 1.5 >> 3 mg weekly  - Lantus 30 >> 40 units after dinner  - Humalog 10  units 15 minutes before dinner >> 10-14 units before the 3 meals - but missing MANY doses We stopped glipizide due to dizziness. She was on saxagliptin/metformin XR 09/998 mg (Kombyglyze) in the past. She was on JanuMet 50/1000 bid in 2015 >> lightheaded, HA, blurry vision.  She is not checking sugars. From last OV: - am:  150-175 >> 122-163, 215 >> 150-170 >> 154, 193-492 - 2h after b'fast: 135-200, 239, 256 >> n/c >> 306, 377 - before lunch:  120-130 >> 117-133, 143 >> n/c >> 182-370 - 2h after lunch: 103 >> n/c >> 94 >> n/c >>> 180 - before dinner:  69 (?), 97-143 >> n/c >> 125 >> 152 - 2h after dinner:  180-190 >> 116-127, 165 >> n/c >>  181-336 - bedtime:  n/c >> 132-196 >> n/c >> 216-381 - nighttime: 109-188 >> n/c Lowest: 90 - felt this >> 69 >> 125 >> 154. Highest: 259 >> 200s - Mtn dew >> 215 >> 254 - T[employer]giving >> 492  Meter:  One Touch ultra 2  She saw nutrition in the past.  No CKD ; latest BUN/creatinine: Lab Results  Component Value Date   BUN 6 10/23/2019   CREATININE 0.59 10/23/2019   No MAU: Lab Results  Component Value Date   MICRALBCREAT 1.1 10/23/2019   MICRALBCREAT 8 05/27/2018   MICRALBCREAT 0.9 05/24/2017   MICRALBCREAT 0.8 04/06/2016   MICRALBCREAT 2.4 02/04/2015   MICRALBCREAT 0.4 10/30/2013   MICRALBCREAT 18.2 07/17/2013   MICRALBCREAT 1.9 10/06/2012   MICRALBCREAT 1.5 07/15/2012   MICRALBCREAT 1.4 02/26/2011   + HL: Lipids:  Lab Results  Component Value Date   CHOL 143 10/23/2019   HDL 40.20 10/23/2019   LDLCALC 82 10/23/2019   LDLDIRECT 179.6 07/17/2013   TRIG 104.0 10/23/2019   CHOLHDL 4 10/23/2019  On Zocor 20.  - Last eye exam 04/2019: No DR reportedly (Dr Macarthur Critchley - [facility]). - no numbness or tingling in her feet.  She also has a history of anxiety, asthma, GERD, constipation; polyarthropathy; seizure disorder.   Review of Systems Constitutional: no weight gain/no weight loss, no fatigue, no  subjective hyperthermia, no subjective hypothermia Eyes: + blurry vision, no xerophthalmia ENT: no sore throat, no nodules palpated in neck, no dysphagia, no odynophagia, no hoarseness Cardiovascular: no CP/no SOB/no palpitations/+ leg swelling Respiratory: no cough/no SOB/no wheezing Gastrointestinal: no N/no V/no D/no C/no acid reflux Musculoskeletal: no muscle aches/+ joint aches Skin: no rashes, no hair loss Neurological: no tremors/no numbness/no tingling/no dizziness  I reviewed pt's medications, allergies, PMH, social hx, family hx, and changes were documented in the history of present illness. Otherwise, unchanged from my initial visit note.  Past  Medical History:  Diagnosis Date  . ANXIETY 03/17/2007   in past  . ASTHMA 06/26/2007  . DIAB W/O COMP TYPE II/UNS NOT STATED UNCNTRL 04/06/2009  . GERD 06/26/2007  . Irritable bowel syndrome 02/27/2010   constipation  . OA (osteoarthritis) 12/13/2009  . SEIZURE DISORDER 04/17/2010   in past /over 4 years ago/no meds. actually vasovagal per neuro notes   Past Surgical History:  Procedure Laterality Date  . ABDOMINAL HYSTERECTOMY    . CESAREAN SECTION    . TUBAL LIGATION     Social History   Socioeconomic History  . Marital status: Divorced    Spouse name: Not on file  . Number of children: Not on file  . Years of education: Not on file  . Highest education level: Not on file  Occupational History  . Not on file  Tobacco Use  . Smoking status: Never Smoker  . Smokeless tobacco: Never Used  Substance and Sexual Activity  . Alcohol use: No  . Drug use: No  . Sexual activity: Not on file  Other Topics Concern  . Not on file  Social History Narrative   Divorced/Single. 2 chldren. 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 Difficulty of Paying Living Expenses: Not on file  Food Insecurity:   . Worried About Charity fundraiser in the Last Year: Not on file  . Ran Out of Food in the Last Year: Not on file  Transportation Needs:   . Lack of Transportation (Medical): Not on file  . Lack of Transportation (Non-Medical): Not on file  Physical Activity:   . Days of Exercise per Week: Not on file  . Minutes of Exercise per Session: Not on file  Stress:   . Feeling of Stress : Not on file  Social Connections:   . Frequency of Communication with Friends and Family: Not on file  . Frequency of Social Gatherings with Friends and Family: Not on file  . Attends Religious Services: Not on file   . Active Member of Clubs or Organizations: Not on file  . Attends Archivist Meetings: Not on file  . Marital Status: Not on file  Intimate Partner Violence:   . Fear of Current or Ex-Partner: Not on file  . Emotionally Abused: Not on file  . Physically Abused: Not on file  . Sexually Abused: Not on file   Current Outpatient Medications on File Prior to Visit  Medication Sig Dispense Refill  . azelastine (ASTELIN) 0.1 % nasal spray Place 2 sprays into both nostrils 2 (two) times daily. 30 mL 12  . BD PEN NEEDLE NANO 2ND GEN 32G X 4 MM MISC USE ONCE DAILY  AS DIRECTED 100 each 2  . Dulaglutide (TRULICITY) 3 SW/5.4OE SOPN Inject 3 mg into the skin once a week. 12 pen 3  . HUMALOG KWIKPEN 100 UNIT/ML KwikPen INJECT(10 UNITS TOTAL) INTO THE SKIN DAILY BEFORE SUPPER. 15 mL 5  . insulin glargine (LANTUS SOLOSTAR) 100 UNIT/ML Solostar Pen INJECT 30-40 UNITS INTO THE SKIN DAILY AT 10 PM. 15 mL 4  . metFORMIN (GLUCOPHAGE) 1000 MG tablet TAKE 1 TABLET BY MOUTH TWICE A DAY WITH MEALS 180 tablet 3  . promethazine-dextromethorphan (PROMETHAZINE-DM) 6.25-15 MG/5ML syrup Take 5 mLs by mouth 4 (four) times daily as needed for cough. 118 mL 0  . simvastatin (ZOCOR) 20 MG tablet TAKE 1 TABLET BY MOUTH EVERYDAY AT BEDTIME 90 tablet 3  . zolpidem (AMBIEN) 10 MG tablet Take 1 tablet (10 mg total) by mouth at bedtime as needed for sleep. 30 tablet 5   No current facility-administered medications on file prior to visit.   Allergies  Allergen Reactions  . Iodine     Per the patient. Had an oral solution in the 90's that gave her a bad reaction of hallucinations.    Family History  Problem Relation Age of Onset  . Thyroid disease Mother   . Hyperlipidemia Mother   . Breast cancer Mother 46  . Skin cancer Father        not melanoma- passed at 36  . Colon cancer Maternal Uncle   . Stomach cancer Maternal Uncle   . Lung cancer Maternal Uncle   . Cancer Maternal Grandfather        unknown     Objective:   Physical Exam  BP (!) 142/80 (BP Location: Left Arm, Patient Position: Sitting)   Pulse 96   Ht 5\' 5"  (1.651 m)   Wt 167 lb 12.8 oz (76.1 kg)   SpO2 98%   BMI 27.92 kg/m  Body mass index is 27.92 kg/m.  Wt Readings from Last 3 Encounters:  02/26/20 167 lb 12.8 oz (76.1 kg)  10/23/19 164 lb (74.4 kg)  08/28/19 160 lb (72.6 kg)   Constitutional: overweight, in NAD Eyes: PERRLA, EOMI, no exophthalmos ENT: moist mucous membranes, no thyromegaly, no cervical lymphadenopathy Cardiovascular: RRR, No MRG Respiratory: CTA B Gastrointestinal: abdomen soft, NT, ND, BS+ Musculoskeletal: no deformities, strength intact in all 4 Skin: moist, warm, no rashes Neurological: no tremor with outstretched hands, DTR normal in all 4  Assessment:     1. DM2, insulin-dependent, uncontrolled, without long term complications, but with hyperglycemia  Type 1 diabetes investigation was negative: Component     Latest Ref Rng & Units 01/24/2018  ZNT8 Antibodies     U/mL <15  Islet Cell Ab     Neg:<1:1 Negative  Glutamic Acid Decarb Ab     <5 IU/mL <5  C-Peptide     0.80 - 3.85 ng/mL 2.07  Glucose, Plasma     65 - 99 mg/dL 134 (H)   2. HL  3.  Overweight    Plan:     Pt with longstanding, uncontrolled, type 2 diabetes, on basal-bolus insulin regimen, Metformin, and weekly GLP-1 receptor agonist, with much worse control at last visit, and and blood sugars in the 300-400 range after a steroid injections in her shoulders.  At that time HbA1c was 10.2%, much higher.  We increased her insulin doses at that time and she did stop steroids since then. -At today's visit, she is not checking sugars.  She is also not taking the Humalog consistently and  not even daily.  We checked her HbA1c: 10.2% (stable, still very high) -We discussed about the absolute importance of checking sugars and being compliant with her regimen.  I cannot change any doses at this visit until she starts taking them  consistently.  She agrees to start doing that.  I gave her new blood sugar logs.   - I advised her to: Patient Instructions  Please take consistently: - Metformin 1000 mg 2x a day with meals - Trulicity 3 mg weekly  - Lantus 40 units after dinner  - Humalog 10-14 units 15 minutes before each of the 3 meals  Please return in  1.5 months with your sugar log.   - advised to check sugars at different times of the day - 3x a day, rotating check times - advised for yearly eye exams >> she is UTD - return to clinic in 1.5 months  2. HL  -Regulated lipid panel from 10/2019: Fractions at goal: Lab Results  Component Value Date   CHOL 143 10/23/2019   HDL 40.20 10/23/2019   LDLCALC 82 10/23/2019   LDLDIRECT 179.6 07/17/2013   TRIG 104.0 10/23/2019   CHOLHDL 4 10/23/2019  -Continue Zocor 20 without side effects  3.  Overweight -Continue Trulicity which should also help with weight loss -She lost 4 pounds before last visit, but gained 7 lbs since last OV  Philemon Kingdom, MD PhD [facility]

## 2020-02-26 NOTE — Addendum Note (Signed)
Addended by: Neita Carp on: 02/26/2020 03:44 PM   Modules accepted: Orders

## 2020-02-26 NOTE — Patient Instructions (Signed)
Please take consistently: - Metformin 1000 mg 2x a day with meals - Trulicity 3 mg weekly  - Lantus 40 units after dinner  - Humalog 10-14 units 15 minutes before each of the 3 meals  Please return in  1.5 months with your sugar log.

## 2020-03-15 ENCOUNTER — Ambulatory Visit: Payer: Commercial Managed Care - PPO | Attending: Internal Medicine

## 2020-03-15 DIAGNOSIS — Z23 Encounter for immunization: Secondary | ICD-10-CM

## 2020-03-15 NOTE — Progress Notes (Signed)
° °  Covid-19 Vaccination Clinic  Name:  Denise Beard    MRN: [id number] DOB: 1961/03/17  03/15/2020  [name] was observed post Covid-19 immunization for 15 minutes without incident. She was provided with Vaccine Information Sheet and instruction to access the V-Safe system.   [name] was instructed to call 911 with any severe reactions post vaccine:  Difficulty breathing   Swelling of face and throat   A fast heartbeat   A bad rash all over body   Dizziness and weakness

## 2020-04-08 ENCOUNTER — Other Ambulatory Visit: Payer: Self-pay

## 2020-04-08 ENCOUNTER — Ambulatory Visit: Payer: Commercial Managed Care - PPO | Admitting: Internal Medicine

## 2020-04-08 ENCOUNTER — Encounter: Payer: Self-pay | Admitting: Internal Medicine

## 2020-04-08 VITALS — BP 138/90 | HR 97 | Ht 65.0 in | Wt 169.4 lb

## 2020-04-08 DIAGNOSIS — E119 Type 2 diabetes mellitus without complications: Secondary | ICD-10-CM | POA: Diagnosis not present

## 2020-04-08 DIAGNOSIS — E785 Hyperlipidemia, unspecified: Secondary | ICD-10-CM

## 2020-04-08 DIAGNOSIS — E663 Overweight: Secondary | ICD-10-CM | POA: Diagnosis not present

## 2020-04-08 NOTE — Patient Instructions (Addendum)
Please continue: - Metformin 1000 mg 2x a day with meals - Trulicity 3 mg weekly  - Lantus 40 units after dinner  - Humalog 10-14 units 15 minutes before each of the 3 meals  STOP eating after dinner.   Please return in 2 months with your sugar log.

## 2020-04-08 NOTE — Progress Notes (Signed)
Subjective:     Patient ID: Denise Beard, female   DOB: 04/19/1961, 59 y.o.   MRN: [id number]  This visit occurred during the SARS-CoV-2 public health emergency.  Safety protocols were in place, including screening questions prior to the visit, additional usage of staff PPE, and extensive cleaning of exam room while observing appropriate contact time as indicated for disinfecting solutions.   Diabetes  Denise Beard is a pleasant 59 y.o. woman, returning for f/u for DM2, dx 2012, insulin-dependent, uncontrolled, without long-term complications. Last visit 1.5 months ago.  Around the beginning of the year she was getting steroid injections in the shoulder for frozen shoulder and osteoarthritis and sugars were very high.  At last visit, HbA1c was still high and she was not taking her Humalog consistently.  Also, she was not checking sugars at that time...  Reviewed HbA1c levels: Lab Results  Component Value Date   HGBA1C 10.2 (A) 02/26/2020   HGBA1C 10.2 (A) 08/28/2019   HGBA1C 7.3 (A) 04/23/2019   She is on: - Metformin 1000 mg 2x a day with meals - Trulicity 1.5 >> 3 mg weekly  - Lantus 30 >> 40 units after dinner  - Humalog 10  units 15 minutes before dinner >> 10-14 units before the 3 meals We stopped glipizide due to dizziness. She was on saxagliptin/metformin XR 09/998 mg (Kombyglyze) in the past. She was on JanuMet 50/1000 bid in 2015 >> lightheaded, HA, blurry vision.  She is checking sugars 1-3 times a day: - am:  150-170 >> 154, 193-492 >> 134, 163, 222-303 - 2h after b'fast: 135-200, 239, 256 >> n/c >> 306, 377 - before lunch: 117-133, 143 >> n/c >> 182-370 >> 90-172 - 2h after lunch: 94 >> n/c >>> 180 >> 159-224 - before dinner:  69 (?), 97-143 >> n/c >> 125 >> 152 >> 70-206 - 2h after dinner:  116-127, 165 >> n/c >> 181-336 >> 138 - bedtime:  n/c >> 132-196 >> n/c >> 216-381 >> 199 - nighttime: 109-188 >> n/c Lowest: 90 - felt this >> 69 >> 125 >> 154 >> 56 (this wee:  oral sx). Highest: 259 >> 200s - Mtn dew >> 215 >> 254 - Txgiving >> 492 >> 331  Meter:  One Touch ultra 2  She saw nutrition in the past.  No CKD; latest BUN/creatinine: Lab Results  Component Value Date   BUN 6 10/23/2019   CREATININE 0.59 10/23/2019   No MAU: Lab Results  Component Value Date   MICRALBCREAT 1.1 10/23/2019   MICRALBCREAT 8 05/27/2018   MICRALBCREAT 0.9 05/24/2017   MICRALBCREAT 0.8 04/06/2016   MICRALBCREAT 2.4 02/04/2015   MICRALBCREAT 0.4 10/30/2013   MICRALBCREAT 18.2 07/17/2013   MICRALBCREAT 1.9 10/06/2012   MICRALBCREAT 1.5 07/15/2012   MICRALBCREAT 1.4 02/26/2011   + HL; lipids:  Lab Results  Component Value Date   CHOL 143 10/23/2019   HDL 40.20 10/23/2019   LDLCALC 82 10/23/2019   LDLDIRECT 179.6 07/17/2013   TRIG 104.0 10/23/2019   CHOLHDL 4 10/23/2019  On Zocor 20.  - Last eye exam 04/2019: No DR reportedly (Dr Macarthur Critchley - [facility]). -No numbness or tingling in her feet.  She also has a history of anxiety, asthma, GERD, constipation; polyarthropathy; seizure disorder.   Review of Systems Constitutional: no weight gain/no weight loss, no fatigue, no subjective hyperthermia, no subjective hypothermia Eyes: no blurry vision, no xerophthalmia ENT: no sore throat, no nodules palpated in neck, no dysphagia, no odynophagia,  no hoarseness Cardiovascular: no CP/no SOB/no palpitations/+ leg swelling Respiratory: no cough/no SOB/no wheezing Gastrointestinal: no N/no V/no D/no C/no acid reflux Musculoskeletal: no muscle aches/+ joint aches Skin: no rashes, no hair loss Neurological: no tremors/no numbness/no tingling/no dizziness  I reviewed pt's medications, allergies, PMH, social hx, family hx, and changes were documented in the history of present illness. Otherwise, unchanged from my initial visit note.  Past Medical History:  Diagnosis Date  . ANXIETY 03/17/2007   in past  . ASTHMA 06/26/2007  . DIAB W/O COMP TYPE II/UNS  NOT STATED UNCNTRL 04/06/2009  . GERD 06/26/2007  . Irritable bowel syndrome 02/27/2010   constipation  . OA (osteoarthritis) 12/13/2009  . SEIZURE DISORDER 04/17/2010   in past /over 4 years ago/no meds. actually vasovagal per neuro notes   Past Surgical History:  Procedure Laterality Date  . ABDOMINAL HYSTERECTOMY    . CESAREAN SECTION    . TUBAL LIGATION     Social History   Socioeconomic History  . Marital status: Divorced    Spouse name: Not on file  . Number of children: Not on file  . Years of education: Not on file  . Highest education level: Not on file  Occupational History  . Not on file  Tobacco Use  . Smoking status: Never Smoker  . Smokeless tobacco: Never Used  Substance and Sexual Activity  . Alcohol use: No  . Drug use: No  . Sexual activity: Not on file  Other Topics Concern  . Not on file  Social History Narrative   Divorced/Single. 2 chldren. 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 Difficulty of Paying Living Expenses: Not on file  Food Insecurity:   . Worried About Charity fundraiser in the Last Year: Not on file  . Ran Out of Food in the Last Year: Not on file  Transportation Needs:   . Lack of Transportation (Medical): Not on file  . Lack of Transportation (Non-Medical): Not on file  Physical Activity:   . Days of Exercise per Week: Not on file  . Minutes of Exercise per Session: Not on file  Stress:   . Feeling of Stress : Not on file  Social Connections:   . Frequency of Communication with Friends and Family: Not on file  . Frequency of Social Gatherings with Friends and Family: Not on file  . Attends Religious Services: Not on file  . Active Member of Clubs or Organizations: Not on file  . Attends Archivist Meetings: Not on file  .  Marital Status: Not on file  Intimate Partner Violence:   . Fear of Current or Ex-Partner: Not on file  . Emotionally Abused: Not on file  . Physically Abused: Not on file  . Sexually Abused: Not on file   Current Outpatient Medications on File Prior to Visit  Medication Sig Dispense Refill  . azelastine (ASTELIN) 0.1 % nasal spray Place 2 sprays into both nostrils 2 (two) times daily. 30 mL 12  . BD PEN NEEDLE NANO 2ND GEN 32G X 4 MM MISC USE ONCE DAILY AS DIRECTED 100 each 2  . Dulaglutide (TRULICITY) 3 JO/8.7OM SOPN Inject 3 mg into the skin once a week. 6 mL 3  .  HUMALOG KWIKPEN 100 UNIT/ML KwikPen INJECT(10 UNITS TOTAL) INTO THE SKIN DAILY BEFORE SUPPER. 15 mL 5  . insulin glargine (LANTUS SOLOSTAR) 100 UNIT/ML Solostar Pen INJECT 40 UNITS INTO THE SKIN DAILY AT 10 PM. 45 mL 3  . metFORMIN (GLUCOPHAGE) 1000 MG tablet TAKE 1 TABLET BY MOUTH TWICE A DAY WITH MEALS 180 tablet 3  . promethazine-dextromethorphan (PROMETHAZINE-DM) 6.25-15 MG/5ML syrup Take 5 mLs by mouth 4 (four) times daily as needed for cough. 118 mL 0  . simvastatin (ZOCOR) 20 MG tablet TAKE 1 TABLET BY MOUTH EVERYDAY AT BEDTIME 90 tablet 3  . zolpidem (AMBIEN) 10 MG tablet Take 1 tablet (10 mg total) by mouth at bedtime as needed for sleep. 30 tablet 5   No current facility-administered medications on file prior to visit.   Allergies  Allergen Reactions  . Iodine     Per the patient. Had an oral solution in the 90's that gave her a bad reaction of hallucinations.    Family History  Problem Relation Age of Onset  . Thyroid disease Mother   . Hyperlipidemia Mother   . Breast cancer Mother 16  . Skin cancer Father        not melanoma- passed at 48  . Colon cancer Maternal Uncle   . Stomach cancer Maternal Uncle   . Lung cancer Maternal Uncle   . Cancer Maternal Grandfather        unknown    Objective:   Physical Exam  BP 138/90   Pulse 97   Ht 5\' 5"  (1.651 m)   Wt 169 lb 6.4 oz (76.8 kg)   SpO2 90%    BMI 28.19 kg/m  Body mass index is 28.19 kg/m.  Wt Readings from Last 3 Encounters:  04/08/20 169 lb 6.4 oz (76.8 kg)  02/26/20 167 lb 12.8 oz (76.1 kg)  10/23/19 164 lb (74.4 kg)   Constitutional: overweight, in NAD Eyes: PERRLA, EOMI, no exophthalmos ENT: moist mucous membranes, no thyromegaly, no cervical lymphadenopathy Cardiovascular: Tachycardia, RR, No MRG Respiratory: CTA B Gastrointestinal: abdomen soft, NT, ND, BS+ Musculoskeletal: no deformities, strength intact in all 4 Skin: moist, warm, no rashes Neurological: no tremor with outstretched hands, DTR normal in all 4  Assessment:     1. DM2, insulin-dependent, uncontrolled, without long term complications, but with hyperglycemia  Type 1 diabetes investigation was negative: Component     Latest Ref Rng & Units 01/24/2018  ZNT8 Antibodies     U/mL <15  Islet Cell Ab     Neg:<1:1 Negative  Glutamic Acid Decarb Ab     <5 IU/mL <5  C-Peptide     0.80 - 3.85 ng/mL 2.07  Glucose, Plasma     65 - 99 mg/dL 134 (H)   2. HL  3.  Overweight    Plan:     Pt with longstanding, uncontrolled, type 2 diabetes, on basal/bolus insulin regimen, Metformin, and weekly GLP-1 receptor agonist, with much worse control this year after starting steroid injections, not daily Humalog consistently and not checking blood sugars.  At last visit, we restarted Humalog with every meal, we increase her Lantus dose and I strongly advised her to check her sugars consistent at that time HbA1c was stable, but very high, at 10.2% -At this visit, sugars have improved, but they are still quite high in the morning and variable later in the day.  Upon questioning, she had dental surgery 5 days ago and she is not eating well.  She noticed that  when she skipped her snack after dinner, her sugars are much better in the morning, in the 130s to 160s.  When she is eating after dinner, sugars are in the 200s.  She is determined to stop eating after dinner, but this  is difficult for her, as she is eating and early dinner, between 6 and 7 and goes to bed around 10 PM.  I advised her to try to move dinner slightly later and if she wanted to have a snack, to make this a low carb snack, for example unsalted nuts.  Otherwise, I did feel we absolutely change her regimen for now. - I advised her to: Patient Instructions  Please continue: - Metformin 1000 mg 2x a day with meals - Trulicity 3 mg weekly  - Lantus 40 units after dinner  - Humalog 10-14 units 15 minutes before each of the 3 meals  STOP eating after dinner.   Please return in 2 months with your sugar log.   - advised to check sugars at different times of the day - 3x a day, rotating check times - advised for yearly eye exams >> she is UTD - UTD with flu shot - return to clinic in 2 months  2. HL  -Reviewed latest lipid panel from 10/2019: Fractions at goal: Lab Results  Component Value Date   CHOL 143 10/23/2019   HDL 40.20 10/23/2019   LDLCALC 82 10/23/2019   LDLDIRECT 179.6 07/17/2013   TRIG 104.0 10/23/2019   CHOLHDL 4 10/23/2019  -Continue Zocor 20 without side effects  3.  Overweight -We will continue Trulicity which should also help with weight loss -She gained 7 pounds before last visit, but gained 2 pounds back since last visit  Philemon Kingdom, MD PhD [facility]

## 2020-04-27 ENCOUNTER — Other Ambulatory Visit: Payer: Self-pay | Admitting: Family Medicine

## 2020-04-27 ENCOUNTER — Telehealth: Payer: Self-pay

## 2020-04-27 NOTE — Telephone Encounter (Signed)
Medication sent to Dr. Yong Channel for approval.

## 2020-04-27 NOTE — Telephone Encounter (Signed)
..   LAST APPOINTMENT DATE: 04/27/2020   NEXT APPOINTMENT DATE:@Visit  date not found  MEDICATION:zolpidem (AMBIEN) 10 MG tablet

## 2020-06-24 ENCOUNTER — Ambulatory Visit: Payer: Commercial Managed Care - PPO | Admitting: Internal Medicine

## 2020-08-10 ENCOUNTER — Telehealth: Payer: Self-pay | Admitting: Internal Medicine

## 2020-08-10 DIAGNOSIS — E119 Type 2 diabetes mellitus without complications: Secondary | ICD-10-CM

## 2020-08-10 NOTE — Telephone Encounter (Signed)
MEDICATION:  BD PEN NEEDLE NANO 2ND GEN 32G X 4 MM MISC  PHARMACY:   [id number] [address], [place] - [address] Phone:  [phone]  Fax:  [phone]      HAS THE PATIENT CONTACTED [facility]? Yes   IS THIS A 90 DAY SUPPLY : No  IS PATIENT OUT OF MEDICATION: Yes  IF NOT; HOW MUCH IS LEFT: 0  LAST APPOINTMENT DATE: @11 /19/2021  NEXT APPOINTMENT DATE:@4 /22/2022  DO WE HAVE YOUR PERMISSION TO LEAVE A DETAILED MESSAGE?: Yes  OTHER COMMENTS:    **Let patient know to contact pharmacy at the end of the day to make sure medication is ready. **  ** Please notify patient to allow 48-72 hours to process**  **Encourage patient to contact the pharmacy for refills or they can request refills through [facility]**

## 2020-08-11 MED ORDER — BD PEN NEEDLE NANO 2ND GEN 32G X 4 MM MISC: 0 refills | Status: DC

## 2020-08-11 NOTE — Telephone Encounter (Signed)
Rx sent to preferred pharmacy.

## 2020-08-29 ENCOUNTER — Encounter: Payer: Self-pay | Admitting: Physician Assistant

## 2020-08-29 ENCOUNTER — Other Ambulatory Visit: Payer: Self-pay

## 2020-08-29 ENCOUNTER — Ambulatory Visit: Payer: Commercial Managed Care - PPO | Admitting: Physician Assistant

## 2020-08-29 VITALS — BP 118/74 | HR 84 | Temp 97.9°F | Ht 65.0 in | Wt 165.2 lb

## 2020-08-29 DIAGNOSIS — M5442 Lumbago with sciatica, left side: Secondary | ICD-10-CM | POA: Diagnosis not present

## 2020-08-29 MED ORDER — NAPROXEN 500 MG PO TABS
500.0000 mg | ORAL_TABLET | Freq: Two times a day (BID) | ORAL | 0 refills | Status: DC
Start: 2020-08-29 — End: 2022-05-30

## 2020-08-29 MED ORDER — METHOCARBAMOL 500 MG PO TABS: 500.0000 mg | ORAL_TABLET | Freq: Four times a day (QID) | ORAL | 0 refills | Status: DC

## 2020-08-29 NOTE — Progress Notes (Signed)
Acute Office Visit  Subjective:    Patient ID: Denise Beard, female    DOB: 1961/05/15, 60 y.o.   MRN: [id number]  Chief Complaint  Patient presents with  . Back Pain    Left side radiates down leg    HPI Patient is in today for low back pain x 5 days. Radiates down left leg to her knee, burning type pain. Tried some Tylenol and heat with only limited relief. She denies any known injury. Works from Teaching laboratory technician. Denies any foot drop, no fevers, no incontinence, no saddle anesthesia.   She has had left knee pain for the last few years, but in the last 1-2 months have felt it buckling / giving way on her. She wonders if this is contributing to her back pain now.   Past Medical History:  Diagnosis Date  . ANXIETY 03/17/2007   in past  . ASTHMA 06/26/2007  . DIAB W/O COMP TYPE II/UNS NOT STATED UNCNTRL 04/06/2009  . GERD 06/26/2007  . Irritable bowel syndrome 02/27/2010   constipation  . OA (osteoarthritis) 12/13/2009  . SEIZURE DISORDER 04/17/2010   in past /over 4 years ago/no meds. actually vasovagal per neuro notes    Past Surgical History:  Procedure Laterality Date  . ABDOMINAL HYSTERECTOMY    . CESAREAN SECTION    . TUBAL LIGATION      Family History  Problem Relation Age of Onset  . Thyroid disease Mother   . Hyperlipidemia Mother   . Breast cancer Mother 72  . Skin cancer Father        not melanoma- passed at 85  . Colon cancer Maternal Uncle   . Stomach cancer Maternal Uncle   . Lung cancer Maternal Uncle   . Cancer Maternal Grandfather        unknown    Social History   Socioeconomic History  . Marital status: Divorced    Spouse name: Not on file  . Number of children: Not on file  . Years of education: Not on file  . Highest education level: Not on file  Occupational History  . Not on file  Tobacco Use  . Smoking status: Never Smoker  . Smokeless tobacco: Never Used  Substance and Sexual Activity  . Alcohol use: No  . Drug use: No  . Sexual  activity: Not on file  Other Topics Concern  . Not on file  Social History Narrative   Divorced/Single. 2 chldren. 2 grandkids   Moved in with mother to help her      Works; Nurse, mental health for background checks - [place]      Hobbies: church very active      Social Determinants of Radio broadcast assistant Strain: Not on [employer] Insecurity: Not on file  Transportation Needs: Not on file  Physical Activity: Not on file  Stress: Not on file  Social Connections: Not on file  Intimate Partner Violence: Not on file    Outpatient Medications Prior to Visit  Medication Sig Dispense Refill  . azelastine (ASTELIN) 0.1 % nasal spray Place 2 sprays into both nostrils 2 (two) times daily. 30 mL 12  . Dulaglutide (TRULICITY) 3 KZ/9.9JT SOPN Inject 3 mg into the skin once a week. 6 mL 3  . HUMALOG KWIKPEN 100 UNIT/ML KwikPen INJECT(10 UNITS TOTAL) INTO THE SKIN DAILY BEFORE SUPPER. 15 mL 5  . insulin glargine (LANTUS SOLOSTAR) 100 UNIT/ML Solostar Pen INJECT 40 UNITS INTO THE SKIN DAILY  AT 10 PM. 45 mL 3  . Insulin Pen Needle (BD PEN NEEDLE NANO 2ND GEN) 32G X 4 MM MISC USE ONCE DAILY AS DIRECTED 100 each 0  . metFORMIN (GLUCOPHAGE) 1000 MG tablet TAKE 1 TABLET BY MOUTH TWICE A DAY WITH MEALS 180 tablet 3  . simvastatin (ZOCOR) 20 MG tablet TAKE 1 TABLET BY MOUTH EVERYDAY AT BEDTIME 90 tablet 3  . zolpidem (AMBIEN) 10 MG tablet TAKE 1 TABLET BY MOUTH AT BEDTIME AS NEEDED FOR SLEEP. 30 tablet 5   No facility-administered medications prior to visit.    Allergies  Allergen Reactions  . Iodine     Per the patient. Had an oral solution in the 90's that gave her a bad reaction of hallucinations.     Review of Systems REFER TO HPI FOR PERTINENT POSITIVES AND NEGATIVES     Objective:    Physical Exam Vitals and nursing note reviewed. Exam conducted with a chaperone present.  Constitutional:      Appearance: Normal appearance.  Musculoskeletal:     Lumbar  back: No signs of trauma or bony tenderness. Normal range of motion. Positive left straight leg raise test.     Right lower leg: Normal. No swelling or tenderness.     Left lower leg: Normal. No swelling or tenderness.  Neurological:     Mental Status: She is alert.     BP 118/74   Pulse 84   Temp 97.9 F (36.6 C)   Ht 5\' 5"  (1.651 m)   Wt 165 lb 3.2 oz (74.9 kg)   SpO2 96%   BMI 27.49 kg/m  Wt Readings from Last 3 Encounters:  08/29/20 165 lb 3.2 oz (74.9 kg)  04/08/20 169 lb 6.4 oz (76.8 kg)  02/26/20 167 lb 12.8 oz (76.1 kg)    Health Maintenance Due  Topic Date Due  . OPHTHALMOLOGY EXAM  04/22/2020  . HEMOGLOBIN A1C  08/26/2020  . URINE MICROALBUMIN  10/22/2020    There are no preventive care reminders to display for this patient.   Lab Results  Component Value Date   TSH 1.62 10/23/2019   Lab Results  Component Value Date   WBC 7.9 10/23/2019   HGB 11.9 (L) 10/23/2019   HCT 36.0 10/23/2019   MCV 90.4 10/23/2019   PLT 282.0 10/23/2019   Lab Results  Component Value Date   NA 140 10/23/2019   K 4.0 10/23/2019   CO2 28 10/23/2019   GLUCOSE 199 (H) 10/23/2019   BUN 6 10/23/2019   CREATININE 0.59 10/23/2019   BILITOT 0.4 10/23/2019   ALKPHOS 97 10/23/2019   AST 15 10/23/2019   ALT 18 10/23/2019   PROT 7.0 10/23/2019   ALBUMIN 4.1 10/23/2019   CALCIUM 9.9 10/23/2019   GFR 126.94 10/23/2019   Lab Results  Component Value Date   CHOL 143 10/23/2019   Lab Results  Component Value Date   HDL 40.20 10/23/2019   Lab Results  Component Value Date   LDLCALC 82 10/23/2019   Lab Results  Component Value Date   TRIG 104.0 10/23/2019   Lab Results  Component Value Date   CHOLHDL 4 10/23/2019   Lab Results  Component Value Date   HGBA1C 10.2 (A) 02/26/2020       Assessment & Plan:   Problem List Items Addressed This Visit   None   Visit Diagnoses    Acute left-sided low back pain with left-sided sciatica    -  Primary   Relevant  Medications   naproxen (NAPROSYN) 500 MG tablet   methocarbamol (ROBAXIN) 500 MG tablet     1. Acute left-sided low back pain with left-sided sciatica No red flag symptoms on exam. See AVS for exercises/ stretches to work on at home. Will start on Naproxen and Robaxin to take as directed to help with symptom relief. Side effects discussed. If worse or no improvement, will need formal PT assessment. Pt to call with updates and recheck as needed.  She is also going to call back to have her left knee pain addressed.   Marshay Slates M Jobin Montelongo, PA-C

## 2020-08-29 NOTE — Patient Instructions (Addendum)
I think this is left sided sciatica pain. Please take the medications as directed. Caution with driving. Recheck if worse or no improvement. May need to consider formal PT.   Complete exercise below - hold for about 20 seconds and repeat twice daily at least 3x/week   Sciatica Rehab Ask your health care provider which exercises are safe for you. Do exercises exactly as told by your health care provider and adjust them as directed. It is normal to feel mild stretching, pulling, tightness, or discomfort as you do these exercises. Stop right away if you feel sudden pain or your pain gets worse. Do not begin these exercises until told by your health care provider. Stretching and range-of-motion exercises These exercises warm up your muscles and joints and improve the movement and flexibility of your hips and back. These exercises also help to relieve pain, numbness, and tingling. Sciatic nerve glide 1. Sit in a chair with your head facing down toward your chest. Place your hands behind your back. Let your shoulders slump forward. 2. Slowly straighten one of your legs while you tilt your head back as if you are looking toward the ceiling. Only straighten your leg as far as you can without making your symptoms worse. 3. Hold this position for __________ seconds. 4. Slowly return to the starting position. 5. Repeat with your other leg. Repeat __________ times. Complete this exercise __________ times a day. Knee to chest with hip adduction and internal rotation 1. Lie on your back on a firm surface with both legs straight. 2. Bend one of your knees and move it up toward your chest until you feel a gentle stretch in your lower back and buttock. Then, move your knee toward the shoulder that is on the opposite side from your leg. This is hip adduction and internal rotation. ? Hold your leg in this position by holding on to the front of your knee. 3. Hold this position for __________ seconds. 4. Slowly  return to the starting position. 5. Repeat with your other leg. Repeat __________ times. Complete this exercise __________ times a day.   Prone extension on elbows 1. Lie on your abdomen on a firm surface. A bed may be too soft for this exercise. 2. Prop yourself up on your elbows. 3. Use your arms to help lift your chest up until you feel a gentle stretch in your abdomen and your lower back. ? This will place some of your body weight on your elbows. If this is uncomfortable, try stacking pillows under your chest. ? Your hips should stay down, against the surface that you are lying on. Keep your hip and back muscles relaxed. 4. Hold this position for __________ seconds. 5. Slowly relax your upper body and return to the starting position. Repeat __________ times. Complete this exercise __________ times a day.   Strengthening exercises These exercises build strength and endurance in your back. Endurance is the ability to use your muscles for a long time, even after they get tired. Pelvic tilt This exercise strengthens the muscles that lie deep in the abdomen. 1. Lie on your back on a firm surface. Bend your knees and keep your feet flat on the floor. 2. Tense your abdominal muscles. Tip your pelvis up toward the ceiling and flatten your lower back into the floor. ? To help with this exercise, you may place a small towel under your lower back and try to push your back into the towel. 3. Hold this position for __________  seconds. 4. Let your muscles relax completely before you repeat this exercise. Repeat __________ times. Complete this exercise __________ times a day. Alternating arm and leg raises 1. Get on your hands and knees on a firm surface. If you are on a hard floor, you may want to use padding, such as an exercise mat, to cushion your knees. 2. Line up your arms and legs. Your hands should be directly below your shoulders, and your knees should be directly below your hips. 3. Lift your  left leg behind you. At the same time, raise your right arm and straighten it in front of you. ? Do not lift your leg higher than your hip. ? Do not lift your arm higher than your shoulder. ? Keep your abdominal and back muscles tight. ? Keep your hips facing the ground. ? Do not arch your back. ? Keep your balance carefully, and do not hold your breath. 4. Hold this position for __________ seconds. 5. Slowly return to the starting position. 6. Repeat with your right leg and your left arm. Repeat __________ times. Complete this exercise __________ times a day.   Posture and body mechanics Good posture and healthy body mechanics can help to relieve stress in your body's tissues and joints. Body mechanics refers to the movements and positions of your body while you do your daily activities. Posture is part of body mechanics. Good posture means:  Your spine is in its natural S-curve position (neutral).  Your shoulders are pulled back slightly.  Your head is not tipped forward. Follow these guidelines to improve your posture and body mechanics in your everyday activities. Standing  When standing, keep your spine neutral and your feet about hip width apart. Keep a slight bend in your knees. Your ears, shoulders, and hips should line up.  When you do a task in which you stand in one place for a long time, place one foot up on a stable object that is 2-4 inches (5-10 cm) high, such as a footstool. This helps keep your spine neutral.   Sitting  When sitting, keep your spine neutral and keep your feet flat on the floor. Use a footrest, if necessary, and keep your thighs parallel to the floor. Avoid rounding your shoulders, and avoid tilting your head forward.  When working at a desk or a computer, keep your desk at a height where your hands are slightly lower than your elbows. Slide your chair under your desk so you are close enough to maintain good posture.  When working at a computer, place  your monitor at a height where you are looking straight ahead and you do not have to tilt your head forward or downward to look at the screen.   Resting  When lying down and resting, avoid positions that are most painful for you.  If you have pain with activities such as sitting, bending, stooping, or squatting, lie in a position in which your body does not bend very much. For example, avoid curling up on your side with your arms and knees near your chest (fetal position).  If you have pain with activities such as standing for a long time or reaching with your arms, lie with your spine in a neutral position and bend your knees slightly. Try the following positions: ? Lying on your side with a pillow between your knees. ? Lying on your back with a pillow under your knees. Lifting  When lifting objects, keep your feet at least shoulder width  apart and tighten your abdominal muscles.  Bend your knees and hips and keep your spine neutral. It is important to lift using the strength of your legs, not your back. Do not lock your knees straight out.  Always ask for help to lift heavy or awkward objects.   This information is not intended to replace advice given to you by your health care provider. Make sure you discuss any questions you have with your health care provider. Document Revised: 08/29/2018 Document Reviewed: 05/29/2018 Elsevier Patient Education  [place].

## 2020-09-09 ENCOUNTER — Encounter: Payer: Self-pay | Admitting: Internal Medicine

## 2020-09-09 ENCOUNTER — Other Ambulatory Visit: Payer: Self-pay

## 2020-09-09 ENCOUNTER — Ambulatory Visit: Payer: Commercial Managed Care - PPO | Admitting: Internal Medicine

## 2020-09-09 VITALS — BP 128/90 | HR 88 | Ht 65.0 in | Wt 166.0 lb

## 2020-09-09 DIAGNOSIS — E663 Overweight: Secondary | ICD-10-CM | POA: Diagnosis not present

## 2020-09-09 DIAGNOSIS — E785 Hyperlipidemia, unspecified: Secondary | ICD-10-CM | POA: Diagnosis not present

## 2020-09-09 DIAGNOSIS — E119 Type 2 diabetes mellitus without complications: Secondary | ICD-10-CM

## 2020-09-09 LAB — POCT GLYCOSYLATED HEMOGLOBIN (HGB A1C): Hemoglobin A1C: 7.8 % — AB (ref 4.0–5.6)

## 2020-09-09 MED ORDER — METFORMIN HCL 1000 MG PO TABS: 1.0000 | ORAL_TABLET | Freq: Two times a day (BID) | ORAL | 3 refills | Status: DC

## 2020-09-09 MED ORDER — INSULIN LISPRO (1 UNIT DIAL) 100 UNIT/ML (KWIKPEN): PEN_INJECTOR | SUBCUTANEOUS | 3 refills | Status: DC

## 2020-09-09 NOTE — Progress Notes (Signed)
Subjective:     Patient ID: Denise Beard, female   DOB: May 21, 1961, 60 y.o.   MRN: [id number]  This visit occurred during the SARS-CoV-2 public health emergency.  Safety protocols were in place, including screening questions prior to the visit, additional usage of staff PPE, and extensive cleaning of exam room while observing appropriate contact time as indicated for disinfecting solutions.   Diabetes  Denise Beard is a pleasant 60 y.o. woman, returning for f/u for DM2, dx 2012, insulin-dependent, uncontrolled, without long-term complications. Last visit 5 months ago.  Interim history: Before last visit, she started to get steroid injections in the shoulder for frozen shoulder and osteoarthritis and sugars are very high.  She was also not taking her insulin consistently. No recent steroid inj's. Since last visit, she developed back pain with sciatica. Since last visit, she was not able to eliminate snacks at night, and in fact, she usually has potato chips almost every night.  Reviewed HbA1c levels: Lab Results  Component Value Date   HGBA1C 10.2 (A) 02/26/2020   HGBA1C 10.2 (A) 08/28/2019   HGBA1C 7.3 (A) 04/23/2019   She is on: - Metformin 1000 mg 2x a day with meals - Trulicity 1.5 >> 3 mg weekly  - Lantus 30 >> 40 units after dinner  - Humalog 10  units 15 minutes before dinner >> (10-)14 units before the 3 meals We stopped glipizide due to dizziness. She was on saxagliptin/metformin XR 09/998 mg (Kombyglyze) in the past. She was on JanuMet 50/1000 bid in 2015 >> lightheaded, HA, blurry vision.  She is checking sugars 1-3 times a day: - am:  154, 193-492 >> 134, 163, 222-303 >> 197-243 (chips at night) - 2h after b'fast: 135-200, 239, 256 >> n/c >> 306, 377 >> 59, 77, 96, 122 - before lunch: 117-133, 143 >> n/c >> 182-370 >> 90-172 >> 55, 104, 148 - 2h after lunch: 94 >> n/c >>> 180 >> 159-224 >> 78, 100-179 - before dinner: 125 >> 152 >> 759, 140-206 >> 100-145, 211, 224  (snack) - 2h after dinner:  116-127, 165 >> n/c >> 181-336 >> 138 >> 160-190 - bedtime:  n/c >> 132-196 >> n/c >> 216-381 >> 199  >> n/c - nighttime: 109-188 >> n/c Lowest: 69 >> 125 >> 154 >> 56 (oral surgery) >> 55 Highest: 254 - Txgiving >> 492 >> 331 >> 243  Meter:  One Touch ultra 2  She saw nutrition in the past.  No CKD; latest BUN/creatinine: Lab Results  Component Value Date   BUN 6 10/23/2019   CREATININE 0.59 10/23/2019   No MAU: Lab Results  Component Value Date   MICRALBCREAT 1.1 10/23/2019   MICRALBCREAT 8 05/27/2018   MICRALBCREAT 0.9 05/24/2017   MICRALBCREAT 0.8 04/06/2016   MICRALBCREAT 2.4 02/04/2015   MICRALBCREAT 0.4 10/30/2013   MICRALBCREAT 18.2 07/17/2013   MICRALBCREAT 1.9 10/06/2012   MICRALBCREAT 1.5 07/15/2012   MICRALBCREAT 1.4 02/26/2011   + HL; lipids:  Lab Results  Component Value Date   CHOL 143 10/23/2019   HDL 40.20 10/23/2019   LDLCALC 82 10/23/2019   LDLDIRECT 179.6 07/17/2013   TRIG 104.0 10/23/2019   CHOLHDL 4 10/23/2019  On Zocor 20.  - Last eye exam 04/2019: No DR reportedly (Dr Macarthur Critchley - [facility]).  She just got a letter to schedule a new appointment. -No numbness or tingling in her feet.  She also has a history of anxiety, asthma, GERD, constipation; polyarthropathy; seizure disorder.  Review of Systems Constitutional: no weight gain/no weight loss, no fatigue, no subjective hyperthermia, no subjective hypothermia Eyes: no blurry vision, no xerophthalmia ENT: no sore throat, no nodules palpated in neck, no dysphagia, no odynophagia, no hoarseness Cardiovascular: no CP/no SOB/no palpitations/no leg swelling Respiratory: no cough/no SOB/no wheezing Gastrointestinal: no N/no V/no D/no C/no acid reflux Musculoskeletal: no muscle aches/no joint aches Skin: no rashes, no hair loss Neurological: no tremors/no numbness/no tingling/no dizziness  I reviewed pt's medications, allergies, PMH, social hx, family  hx, and changes were documented in the history of present illness. Otherwise, unchanged from my initial visit note.  Past Medical History:  Diagnosis Date  . ANXIETY 03/17/2007   in past  . ASTHMA 06/26/2007  . DIAB W/O COMP TYPE II/UNS NOT STATED UNCNTRL 04/06/2009  . GERD 06/26/2007  . Irritable bowel syndrome 02/27/2010   constipation  . OA (osteoarthritis) 12/13/2009  . SEIZURE DISORDER 04/17/2010   in past /over 4 years ago/no meds. actually vasovagal per neuro notes   Past Surgical History:  Procedure Laterality Date  . ABDOMINAL HYSTERECTOMY    . CESAREAN SECTION    . TUBAL LIGATION     Social History   Socioeconomic History  . Marital status: Divorced    Spouse name: Not on file  . Number of children: Not on file  . Years of education: Not on file  . Highest education level: Not on file  Occupational History  . Not on file  Tobacco Use  . Smoking status: Never Smoker  . Smokeless tobacco: Never Used  Substance and Sexual Activity  . Alcohol use: No  . Drug use: No  . Sexual activity: Not on file  Other Topics Concern  . Not on file  Social History Narrative   Divorced/Single. 2 chldren. 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 azelastine (ASTELIN) 0.1 % nasal spray Place 2 sprays into both nostrils 2 (two) times daily. 30 mL 12  . Dulaglutide (TRULICITY) 3 0000000 SOPN Inject 3 mg into the skin once a week. 6 mL 3  . HUMALOG KWIKPEN 100 UNIT/ML KwikPen INJECT(10 UNITS TOTAL) INTO  THE SKIN DAILY BEFORE SUPPER. 15 mL 5  . insulin glargine (LANTUS SOLOSTAR) 100 UNIT/ML Solostar Pen INJECT 40 UNITS INTO THE SKIN DAILY AT 10 PM. 45 mL 3  . Insulin Pen Needle (BD PEN NEEDLE NANO 2ND GEN) 32G X 4 MM MISC USE ONCE DAILY AS DIRECTED 100 each 0  . metFORMIN (GLUCOPHAGE) 1000 MG tablet TAKE 1 TABLET BY MOUTH TWICE A DAY WITH MEALS 180 tablet 3  . simvastatin (ZOCOR) 20 MG tablet TAKE 1 TABLET BY MOUTH EVERYDAY AT BEDTIME 90 tablet 3  . zolpidem (AMBIEN) 10 MG tablet TAKE 1 TABLET BY MOUTH AT BEDTIME AS NEEDED FOR SLEEP. 30 tablet 5   No current facility-administered medications on file prior to visit.   Allergies  Allergen Reactions  . Iodine  Per the patient. Had an oral solution in the 90's that gave her a bad reaction of hallucinations.    Family History  Problem Relation Age of Onset  . Thyroid disease Mother   . Hyperlipidemia Mother   . Breast cancer Mother 62  . Skin cancer Father        not melanoma- passed at 36  . Colon cancer Maternal Uncle   . Stomach cancer Maternal Uncle   . Lung cancer Maternal Uncle   . Cancer Maternal Grandfather        unknown    Objective:   Physical Exam  BP 128/90 (BP Location: Right Arm, Patient Position: Sitting, Cuff Size: Normal)   Pulse 88   Ht 5\' 5"  (1.651 m)   Wt 166 lb (75.3 kg)   SpO2 98%   BMI 27.62 kg/m  Body mass index is 27.62 kg/m.  Wt Readings from Last 3 Encounters:  09/09/20 166 lb (75.3 kg)  08/29/20 165 lb 3.2 oz (74.9 kg)  04/08/20 169 lb 6.4 oz (76.8 kg)   Constitutional: overweight, in NAD Eyes: PERRLA, EOMI, no exophthalmos ENT: moist mucous membranes, no thyromegaly, no cervical lymphadenopathy Cardiovascular: RRR, No MRG Respiratory: CTA B Gastrointestinal: abdomen soft, NT, ND, BS+ Musculoskeletal: no deformities, strength intact in all 4 Skin: moist, warm, no rashes Neurological: no tremor with outstretched hands, DTR normal in all 4  Assessment:     1. DM2, insulin-dependent,  uncontrolled, without long term complications, but with hyperglycemia  Type 1 diabetes investigation was negative: Component     Latest Ref Rng & Units 01/24/2018  ZNT8 Antibodies     U/mL <15  Islet Cell Ab     Neg:<1:1 Negative  Glutamic Acid Decarb Ab     <5 IU/mL <5  C-Peptide     0.80 - 3.85 ng/mL 2.07  Glucose, Plasma     65 - 99 mg/dL 134 (H)   2. HL  3.  Overweight    Plan:     Pt with longstanding, uncontrolled, type 2 diabetes, on basal-bolus insulin regimen, metformin, and also weekly GLP-1 receptor agonist with much worse control last year after starting steroid injections, not taking her Humalog consistently and not checking blood sugars.  HbA1c increased to 10.2%.  At last visit, sugars started to improve but they were still quite high in the morning to variable later in the day.  She just had dental surgery before last visit and was not eating well.  We did not increase her insulin at that time but I did advise her to stop eating after dinner or, if she did have a snack, to use a low-carb snack. -At this visit, she has has quite high blood sugars in the morning, even in the 200s, and they improve after breakfast.  In fact, they dropped to low after she boluses Humalog before breakfast and lunch so we will go ahead and decrease this doses.  They appear to be controlled after dinner so we will maintain the same dose of Humalog.  Also, I feel that the Lantus dose is sufficient, but the problem is that she is still eating at night and very calorie-dense foods.  We discussed again about the importance of stopping this.  She absolutely needs to stop eating after dinner, and especially foods like chips.  We will try to move the entire dose with dinner to curb her appetite and also to hopefully help more with the morning sugars.  At next visit, we can  also increase Trulicity, but she does have several 3 mg pens at home and for now we will continue with the same dose. - I advised her  to: Patient Instructions  Please try to move: - Metformin 2000 mg with dinner  Please continue: - Trulicity 3 mg weekly  - Lantus 40 units after dinner   Please decrease: - Humalog 8-10 units 15 minutes before b'fast - Humalog 8-10 units 15 minutes before lunch - Humalog 12-14 units 15 minutes before dinner  STOP eating after dinner.   Please return in 3-4 months with your sugar log.   - we checked her HbA1c: 7.8% (lower) - advised to check sugars at different times of the day - 4x a day, rotating check times - advised for yearly eye exams >> she is due - return to clinic in 3-4 months  2. HL  -Reviewed latest lipid panel from 10/2019: Fractions at goal: Lab Results  Component Value Date   CHOL 143 10/23/2019   HDL 40.20 10/23/2019   LDLCALC 82 10/23/2019   LDLDIRECT 179.6 07/17/2013   TRIG 104.0 10/23/2019   CHOLHDL 4 10/23/2019  -Continues Zocor 20 mg daily without side effects -She has an appointment with Dr. Yong Channel coming up  3.  Overweight -We will continue Trulicity which should also help with weight loss -She lost 3 pounds since last visit -Recommended dietary changes  Philemon Kingdom, MD PhD [facility]

## 2020-09-09 NOTE — Patient Instructions (Addendum)
Please try to move: - Metformin 2000 mg with dinner  Please continue: - Trulicity 3 mg weekly  - Lantus 40 units after dinner   Please decrease: - Humalog 8-10 units 15 minutes before b'fast - Humalog 8-10 units 15 minutes before lunch - Humalog 12-14 units 15 minutes before dinner  STOP eating after dinner.   Please return in 3-4 months with your sugar log.

## 2020-10-20 ENCOUNTER — Other Ambulatory Visit: Payer: Self-pay | Admitting: Family Medicine

## 2020-10-20 NOTE — Telephone Encounter (Signed)
Pt requesting refill for Zolpidem 10 mg, last OV 08/29/20 with Alyssa.

## 2020-10-20 NOTE — Telephone Encounter (Signed)
Patient is completely out

## 2020-10-25 ENCOUNTER — Encounter: Payer: Commercial Managed Care - PPO | Admitting: Family Medicine

## 2020-10-28 ENCOUNTER — Encounter: Payer: Commercial Managed Care - PPO | Admitting: Family Medicine

## 2020-11-09 ENCOUNTER — Ambulatory Visit (INDEPENDENT_AMBULATORY_CARE_PROVIDER_SITE_OTHER): Payer: Commercial Managed Care - PPO | Admitting: Family Medicine

## 2020-11-09 ENCOUNTER — Other Ambulatory Visit: Payer: Self-pay

## 2020-11-09 ENCOUNTER — Encounter: Payer: Self-pay | Admitting: Family Medicine

## 2020-11-09 VITALS — BP 130/86 | HR 74 | Temp 97.7°F | Ht 65.0 in | Wt 165.4 lb

## 2020-11-09 DIAGNOSIS — E785 Hyperlipidemia, unspecified: Secondary | ICD-10-CM

## 2020-11-09 DIAGNOSIS — E119 Type 2 diabetes mellitus without complications: Secondary | ICD-10-CM | POA: Diagnosis not present

## 2020-11-09 DIAGNOSIS — Z87891 Personal history of nicotine dependence: Secondary | ICD-10-CM | POA: Diagnosis not present

## 2020-11-09 DIAGNOSIS — E1169 Type 2 diabetes mellitus with other specified complication: Secondary | ICD-10-CM

## 2020-11-09 DIAGNOSIS — J4540 Moderate persistent asthma, uncomplicated: Secondary | ICD-10-CM

## 2020-11-09 DIAGNOSIS — G4700 Insomnia, unspecified: Secondary | ICD-10-CM | POA: Diagnosis not present

## 2020-11-09 DIAGNOSIS — Z0000 Encounter for general adult medical examination without abnormal findings: Secondary | ICD-10-CM

## 2020-11-09 LAB — MICROALBUMIN / CREATININE URINE RATIO
Creatinine,U: 149.9 mg/dL
Microalb Creat Ratio: 0.8 mg/g (ref 0.0–30.0)
Microalb, Ur: 1.2 mg/dL (ref 0.0–1.9)

## 2020-11-09 LAB — LIPID PANEL
Cholesterol: 133 mg/dL (ref 0–200)
HDL: 39 mg/dL — ABNORMAL LOW (ref >39.00)
LDL Cholesterol: 77 mg/dL (ref 0–99)
NonHDL: 93.66
Total CHOL/HDL Ratio: 3
Triglycerides: 81 mg/dL (ref 0.0–149.0)
VLDL: 16.2 mg/dL (ref 0.0–40.0)

## 2020-11-09 LAB — CBC WITH DIFFERENTIAL/PLATELET
Basophils Absolute: 0.1 10*3/uL (ref 0.0–0.1)
Basophils Relative: 0.8 % (ref 0.0–3.0)
Eosinophils Absolute: 0.2 10*3/uL (ref 0.0–0.7)
Eosinophils Relative: 2.4 % (ref 0.0–5.0)
HCT: 37.6 % (ref 36.0–46.0)
Hemoglobin: 12.4 g/dL (ref 12.0–15.0)
Lymphocytes Relative: 45.2 % (ref 12.0–46.0)
Lymphs Abs: 3.7 10*3/uL (ref 0.7–4.0)
MCHC: 32.9 g/dL (ref 30.0–36.0)
MCV: 90.7 fl (ref 78.0–100.0)
Monocytes Absolute: 0.6 10*3/uL (ref 0.1–1.0)
Monocytes Relative: 6.9 % (ref 3.0–12.0)
Neutro Abs: 3.7 10*3/uL (ref 1.4–7.7)
Neutrophils Relative %: 44.7 % (ref 43.0–77.0)
Platelets: 254 10*3/uL (ref 150.0–400.0)
RBC: 4.15 Mil/uL (ref 3.87–5.11)
RDW: 13 % (ref 11.5–15.5)
WBC: 8.2 10*3/uL (ref 4.0–10.5)

## 2020-11-09 LAB — COMPREHENSIVE METABOLIC PANEL
ALT: 15 U/L (ref 0–35)
AST: 16 U/L (ref 0–37)
Albumin: 4.3 g/dL (ref 3.5–5.2)
Alkaline Phosphatase: 87 U/L (ref 39–117)
BUN: 6 mg/dL (ref 6–23)
CO2: 27 mEq/L (ref 19–32)
Calcium: 9.8 mg/dL (ref 8.4–10.5)
Chloride: 106 mEq/L (ref 96–112)
Creatinine, Ser: 0.65 mg/dL (ref 0.40–1.20)
GFR: 97.03 mL/min (ref >60.00)
Glucose, Bld: 161 mg/dL — ABNORMAL HIGH (ref 70–99)
Potassium: 4.5 mEq/L (ref 3.5–5.1)
Sodium: 140 mEq/L (ref 135–145)
Total Bilirubin: 0.4 mg/dL (ref 0.2–1.2)
Total Protein: 7.4 g/dL (ref 6.0–8.3)

## 2020-11-09 LAB — POC URINALSYSI DIPSTICK (AUTOMATED)
Bilirubin, UA: NEGATIVE
Blood, UA: NEGATIVE
Glucose, UA: NEGATIVE
Ketones, UA: NEGATIVE
Leukocytes, UA: NEGATIVE
Nitrite, UA: NEGATIVE
Protein, UA: NEGATIVE
Spec Grav, UA: >=1.03 — AB (ref 1.010–1.025)
Urobilinogen, UA: 0.2 E.U./dL
pH, UA: 5 (ref 5.0–8.0)

## 2020-11-09 MED ORDER — ALBUTEROL SULFATE HFA 108 (90 BASE) MCG/ACT IN AERS
2.0000 | INHALATION_SPRAY | Freq: Four times a day (QID) | RESPIRATORY_TRACT | 2 refills | Status: DC | PRN
Start: 2020-11-09 — End: 2021-05-02

## 2020-11-09 MED ORDER — ZOLPIDEM TARTRATE 10 MG PO TABS: ORAL_TABLET | ORAL | 5 refills | Status: DC

## 2020-11-09 NOTE — Patient Instructions (Addendum)
Health Maintenance Due  Topic Date Due   Prevnar 31 -declines for now, possibly next year Never done   Zoster Vaccines- Shingrix (1 of 2)  Declines for now Never done   OPHTHALMOLOGY EXAM   If you have had your eye exam within the last year, please sign release of information at check out desk. If you have not had an eye exam within a year, please get one at this time as this is important for your diabetes care  04/22/2020   COVID-19 Vaccine (4 - Booster for Moderna series) Will get this scheduled for the fall.  06/15/2020   Please stop by lab before you go If you have mychart- we will send your results within 3 business days of [place] receiving them.  If you do not have mychart- we will call you about results within 5 business days of [place] receiving them.  *please also note that you will see labs on mychart as soon as they post. I will later go in and write notes on them- will say "notes from Dr. Yong Channel"  Recommended follow up: Return in about 1 year (around 11/09/2021) for physical or sooner if needed.

## 2020-11-09 NOTE — Addendum Note (Signed)
Addended by: Loura Back on: 11/09/2020 10:19 AM   Modules accepted: Orders

## 2020-11-09 NOTE — Progress Notes (Signed)
Phone [phone]   Subjective:  Patient presents today for their annual physical. Chief complaint-noted.   See problem oriented charting- ROS- full  review of systems was completed and negative per full ROS sheet  The following were reviewed and entered/updated in epic: Past Medical History:  Diagnosis Date   ANXIETY 03/17/2007   in past   ASTHMA 06/26/2007   DIAB W/O COMP TYPE II/UNS NOT STATED UNCNTRL 04/06/2009   GERD 06/26/2007   Irritable bowel syndrome 02/27/2010   constipation   OA (osteoarthritis) 12/13/2009   SEIZURE DISORDER 04/17/2010   in past /over 4 years ago/no meds. actually vasovagal per neuro notes   Patient Active Problem List   Diagnosis Date Noted   Type II diabetes mellitus, well controlled ([place]) 04/06/2009    Priority: High   Hyperlipidemia associated with type 2 diabetes mellitus ([place]) 02/04/2015    Priority: Medium   Insomnia 02/04/2015    Priority: Medium   Asthma 06/26/2007    Priority: Medium   Osteoarthritis 02/04/2015    Priority: Low   Vasovagal syncope 02/04/2015    Priority: Low   Allergic rhinitis 02/04/2015    Priority: Low   Irritable bowel syndrome 02/27/2010    Priority: Low   GERD 06/26/2007    Priority: Low   Past Surgical History:  Procedure Laterality Date   ABDOMINAL HYSTERECTOMY     CESAREAN SECTION     TUBAL LIGATION      Family History  Problem Relation Age of Onset   Thyroid disease Mother    Hyperlipidemia Mother    Breast cancer Mother 34   Skin cancer Father        not melanoma- passed at 45   Colon cancer Maternal Uncle    Stomach cancer Maternal Uncle    Lung cancer Maternal Uncle    Cancer Maternal Grandfather        unknown    Medications- reviewed and updated Current Outpatient Medications  Medication Sig Dispense Refill   azelastine (ASTELIN) 0.1 % nasal spray Place 2 sprays into both nostrils 2 (two) times daily. 30 mL 12   Dulaglutide (TRULICITY) 3 TM/1.9QQ SOPN Inject 3 mg into the skin once  a week. 6 mL 3   insulin glargine (LANTUS SOLOSTAR) 100 UNIT/ML Solostar Pen INJECT 40 UNITS INTO THE SKIN DAILY AT 10 PM. 45 mL 3   insulin lispro (HUMALOG KWIKPEN) 100 UNIT/ML KwikPen INJECT 8-14 units  INTO THE SKIN 3x a day before meals 30 mL 3   Insulin Pen Needle (BD PEN NEEDLE NANO 2ND GEN) 32G X 4 MM MISC USE ONCE DAILY AS DIRECTED 100 each 0   metFORMIN (GLUCOPHAGE) 1000 MG tablet Take 1 tablet (1,000 mg total) by mouth 2 (two) times daily with a meal. 180 tablet 3   simvastatin (ZOCOR) 20 MG tablet TAKE 1 TABLET BY MOUTH EVERYDAY AT BEDTIME 90 tablet 3   zolpidem (AMBIEN) 10 MG tablet TAKE 1 TABLET BY MOUTH EVERY DAY AT BEDTIME AS NEEDED FOR SLEEP 30 tablet 0   No current facility-administered medications for this visit.    Allergies-reviewed and updated Allergies  Allergen Reactions   Iodine     Per the patient. Had an oral solution in the 90's that gave her a bad reaction of hallucinations.     Social History   Social History Narrative   Divorced/Single. 2 chldren. 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 Oropharynx normal Neck: no thyromegaly CV: RRR no murmurs rubs or gallops Lungs: CTAB no crackles, wheeze, rhonchi Abdomen: soft/nontender/nondistended/normal bowel sounds. No rebound or guarding.  Ext: no edema Skin: warm, dry Neuro: grossly normal, moves all extremities, PERRLA   Diabetic Foot Exam - Simple   Simple Foot Form Diabetic Foot exam was performed with the following findings: Yes 11/09/2020  9:36 AM  Visual Inspection No deformities, no ulcerations, no other skin breakdown bilaterally:  Yes Sensation Testing Intact to touch and monofilament testing bilaterally: Yes Pulse Check Posterior Tibialis and Dorsalis pulse intact bilaterally: Yes Comments       Assessment and Plan   60 y.o. female presenting for annual physical.  Health Maintenance counseling: 1. Anticipatory guidance: Patient counseled regarding regular dental exams -q6 months advised-she has dentures on the top- getting bottom worked on, eye exams -yearly advised- she will get scheduled,  avoiding smoking and second hand smoke , limiting alcohol to 1 beverage per day-no longer drinks.   2. Risk factor reduction:  Advised patient of need for regular exercise and diet rich and fruits and vegetables to reduce risk of heart attack and stroke. Exercise- last year was not exercising-encouraged to restart- some good days and bad days- walking every other day for 15 minutes.  Diet-weight largely stable over the last year. Discussed reducing calorie intake to help Wt Readings from Last 3 Encounters:  11/09/20 165 lb 6.4 oz (75 kg)  09/09/20 166 lb (75.3 kg)  08/29/20 165 lb 3.2 oz (74.9 kg)  3. Immunizations/screenings/ancillary studies-discussed Prevnar 20 today- wants to hold off for now, discussed Shingrix, discussed COVID-19 vaccine #4 at the pharmacy- wants to hold off until the fall  Immunization History  Administered Date(s) Administered   Influenza Whole 02/19/2011   Influenza,inj,Quad PF,6+ Mos 05/24/2017, 04/05/2018, 04/23/2019   Influenza-Unspecified 02/09/2015, 01/24/2016, 04/30/2018   Moderna SARS-COV2 Booster Vaccination 03/15/2020   Moderna Sars-Covid-2 Vaccination 07/30/2019, 09/01/2019   Pneumococcal Conjugate-13 11/10/2014   Pneumococcal Polysaccharide-23 10/23/2019   Td 09/02/2007   Tdap 10/17/2018  4. Cervical cancer screening- February 19, 2018 on file-will be due later this year 5. Breast cancer screening-  breast exam with GYN and mammogram 04/09/2019 on file-she believes scheduled for august-  asked her to have them send [place] a copy 6. Colon cancer screening -02/17/2015 with 10-year follow-up planned.  Denies rectal bleeding or melena  7. Skin cancer screening-lower risk due to melanin content.  We did refer her due to family history of skin cancer last year to dermatology- she opted to hold off. advised regular sunscreen use. Denies worrisome, changing, or new skin lesions.  8. Birth control/STD check- postmenopause and not dating/last active 2008 9. Osteoporosis screening at 65- has done this with gynecology last year- was normal -Former smoker-smoked 12 years less than a pack per day.  Quit 15 years ago-no regular screening required  Status of chronic or acute concerns   #Social update-lost brother in law suddenly april 2021- was also her pastor- had kids 44 and 8- he was only 46-today she reports everyone doing reasonably well- she is  still grieving some   % Diabetes-managed by Dr. Cruzita Lederer S: Medication: Trulicity 3 mg, Lantus, insulin lispro, metformin 1000 mg twice daily Lab Results  Component Value Date   HGBA1C 7.8 (A) 09/09/2020  A/P: has been improving -worhing with Dr. Cruzita Lederer to bring down (had bene higher -Eye exam advised  -Foot exam today   #hyperlipidemia S: Medication: Simvastatin 20 mg Lab Results  Component Value Date   CHOL 143 10/23/2019   HDL 40.20 10/23/2019   LDLCALC 82 10/23/2019   LDLDIRECT 179.6 07/17/2013   TRIG 104.0 10/23/2019   CHOLHDL 4 10/23/2019   A/P: hopefully stable- continue simvastatin as long as LDL under 100  # Asthma S: Maintenance Medication: off of symbicort recently and doing well As needed medication:  once a mongth albuterol  A/P: controlled without controller- continue to monitor   #Insomnia S: Ambien 10 mg A/P: good control- when she gets to 67 she knows we need to reduce to 5 mg    #Allergic rhinitis S: Medication: Astelin nasal spray 2 sprays twice daily as needed A/P: reasonable control- continue current meds       Recommended follow up: Return in about 1 year (around 11/09/2021) for physical or sooner if needed. Future Appointments  Date Time Provider [place]  01/13/2021  2:40 PM Philemon Kingdom, MD [facility] None    Lab/Order associations: fasting   ICD-10-CM   1. Preventative health care  Z00.00     2. Type II diabetes mellitus, well controlled ([place])  E11.9     3. Hyperlipidemia associated with type 2 diabetes mellitus ([place])  E11.69    E78.5     4. Insomnia, unspecified type  G47.00     5. Moderate persistent asthma without complication  R91.63     6. Former smoker  Z87.891       No orders of the defined types were placed in this encounter.   Return precautions advised.  [name], MD

## 2020-11-24 ENCOUNTER — Other Ambulatory Visit: Payer: Self-pay | Admitting: Family Medicine

## 2021-01-13 ENCOUNTER — Ambulatory Visit: Payer: Commercial Managed Care - PPO | Admitting: Internal Medicine

## 2021-02-15 ENCOUNTER — Encounter: Payer: Self-pay | Admitting: Family Medicine

## 2021-02-15 ENCOUNTER — Ambulatory Visit: Payer: Self-pay

## 2021-02-15 ENCOUNTER — Other Ambulatory Visit: Payer: Self-pay

## 2021-02-15 ENCOUNTER — Ambulatory Visit: Payer: Commercial Managed Care - PPO | Admitting: Family Medicine

## 2021-02-15 VITALS — BP 122/80 | HR 78 | Ht 65.0 in | Wt 166.0 lb

## 2021-02-15 DIAGNOSIS — M5010 Cervical disc disorder with radiculopathy, unspecified cervical region: Secondary | ICD-10-CM | POA: Diagnosis not present

## 2021-02-15 DIAGNOSIS — M25511 Pain in right shoulder: Secondary | ICD-10-CM

## 2021-02-15 DIAGNOSIS — M7501 Adhesive capsulitis of right shoulder: Secondary | ICD-10-CM | POA: Diagnosis not present

## 2021-02-15 MED ORDER — GABAPENTIN 300 MG PO CAPS
300.0000 mg | ORAL_CAPSULE | Freq: Three times a day (TID) | ORAL | 3 refills | Status: DC | PRN
Start: 2021-02-15 — End: 2021-11-13

## 2021-02-15 NOTE — Patient Instructions (Addendum)
Thank you for coming in today.   You received a steroid injection today. Seek immediate medical attention if the joint becomes red, extremely painful, or is oozing fluid.    Recheck back in 1 month.   I've referred you to Physical Therapy.  Let us know if you don't hear from them in one week.   Take gabapentin mostly at bedtime for nerve pain.   Try a heating pad.

## 2021-02-15 NOTE — Progress Notes (Signed)
I, Peterson Lombard, LAT, ATC acting as a scribe for Lynne Leader, MD.  Denise Beard is a 60 y.o. female who presents to [place] [facility] today for cont R shoulder pain. Pt was last seen by Dr. Georgina Snell on 08/28/19 and was advised to proceed to MRI, but it was never obtained. Today, pt reports R shoulder starting hurting about 4 weeks ago, worsening. No MOI. Pt locates pain to the superior aspect of her R shoulder.  Pain radiates to the right thumb.  Radiating: yes- into R wrist and scapular area Aggravates: any AROM Treatments tried: creams, tylenol  Dx imaging: 07/17/19 R shoulder XR   Pertinent review of systems: No fevers or chills  Relevant historical information: Diabetes A1c 7.8 in April   Exam:  BP 122/80   Pulse 78   Ht 5\' 5"  (1.651 m)   Wt 166 lb (75.3 kg)   SpO2 98%   BMI 27.62 kg/m  General: Well Developed, well nourished, and in no acute distress.   MSK: C-spine: Normal. Nontender midline.  Tender palpation right cervical paraspinal musculature. Decreased cervical motion. Positive right-sided Spurling's test. Upper extremity strength intact within limits of shoulder motion.  Right shoulder normal. Shoulder is nontender. Tender to palpation right trapezius. Range of motion abduction 70 degrees external rotation 30 degrees beyond neutral position internal rotation posterior iliac crest. Strength intact within limits of motion.  Pulses capillary fill and sensation are intact distally.  Lab and Radiology Results  Procedure: Real-time Ultrasound Guided Injection of right shoulder glenohumeral joint posterior approach Device: Philips Affiniti 50G Images permanently stored and available for review in PACS Verbal informed consent obtained.  Discussed risks and benefits of procedure. Warned about infection bleeding damage to structures skin hypopigmentation and fat atrophy among others. Patient expresses understanding and  agreement Time-out conducted.   Noted no overlying erythema, induration, or other signs of local infection.   Skin prepped in a sterile fashion.   Local anesthesia: Topical Ethyl chloride.   With sterile technique and under real time ultrasound guidance: 40 mg of Kenalog and 2 mL of lidocaine injected into shoulder joint. Fluid seen entering the joint capsule.   Completed without difficulty   Pain immediately resolved suggesting accurate placement of the medication.   Advised to call if fevers/chills, erythema, induration, drainage, or persistent bleeding.   Images permanently stored and available for review in the ultrasound unit.  Impression: Technically successful ultrasound guided injection.         Assessment and Plan: 60 y.o. female with right shoulder pain with lack of range of motion developing over 4 weeks without clear explanation in the setting of diabetes is concerning for adhesive capsulitis.  This was the same concern a year and a half ago.  However patient resolved after injection and physical therapy somewhat spontaneously.  Additionally she has neck pain thought to be periscapular and trapezius dysfunction.  Lastly she has what I think is C6 cervical radiculopathy as well.  Plan for glenohumeral injection and referral to physical therapy.  Additionally treat with gabapentin for radicular pain.  Recheck in 1 month.   PDMP not reviewed this encounter. Orders Placed This Encounter  Procedures   [place] LIMITED JOINT SPACE STRUCTURES UP RIGHT(NO LINKED CHARGES)    Standing Status:   Future    Number of Occurrences:   1    Standing Expiration Date:   08/15/2021    Order Specific Question:   Reason for Exam (SYMPTOM  OR  DIAGNOSIS REQUIRED)    Answer:   right shoulder pain    Order Specific Question:   Preferred imaging location?    Answer:   [place]   Ambulatory referral to Physical Therapy    Referral Priority:   Routine    Referral Type:    Physical Medicine    Referral Reason:   Specialty Services Required    Requested Specialty:   Physical Therapy    Number of Visits Requested:   1   Meds ordered this encounter  Medications   gabapentin (NEURONTIN) 300 MG capsule    Sig: Take 1 capsule (300 mg total) by mouth 3 (three) times daily as needed.    Dispense:  90 capsule    Refill:  3     Discussed warning signs or symptoms. Please see discharge instructions. Patient expresses understanding.   The above documentation has been reviewed and is accurate and complete Lynne Leader, M.D.

## 2021-03-02 ENCOUNTER — Encounter (HOSPITAL_BASED_OUTPATIENT_CLINIC_OR_DEPARTMENT_OTHER): Payer: Self-pay | Admitting: Physical Therapy

## 2021-03-02 ENCOUNTER — Ambulatory Visit (HOSPITAL_BASED_OUTPATIENT_CLINIC_OR_DEPARTMENT_OTHER): Payer: Commercial Managed Care - PPO | Attending: Family Medicine | Admitting: Physical Therapy

## 2021-03-02 ENCOUNTER — Other Ambulatory Visit: Payer: Self-pay

## 2021-03-02 DIAGNOSIS — M25511 Pain in right shoulder: Secondary | ICD-10-CM | POA: Diagnosis present

## 2021-03-02 DIAGNOSIS — M6281 Muscle weakness (generalized): Secondary | ICD-10-CM | POA: Diagnosis present

## 2021-03-02 DIAGNOSIS — M25611 Stiffness of right shoulder, not elsewhere classified: Secondary | ICD-10-CM | POA: Insufficient documentation

## 2021-03-02 DIAGNOSIS — G8929 Other chronic pain: Secondary | ICD-10-CM | POA: Diagnosis present

## 2021-03-02 NOTE — Therapy (Signed)
OUTPATIENT PHYSICAL THERAPY SHOULDER EVALUATION   Patient Name: Denise Beard MRN: [id number] DOB:1961/02/07, 60 y.o., female Today's Date: 03/02/2021   PT End of Session - 03/02/21 1514     Visit Number 1    Number of Visits 13    Date for PT Re-Evaluation 05/31/21    Authorization Type UHC    PT Start Time 8182    PT Stop Time 1600    PT Time Calculation (min) 45 min    Activity Tolerance Patient tolerated treatment well;Patient limited by pain    Behavior During Therapy [facility] for tasks assessed/performed             Past Medical History:  Diagnosis Date   ANXIETY 03/17/2007   in past   ASTHMA 06/26/2007   DIAB W/O COMP TYPE II/UNS NOT STATED UNCNTRL 04/06/2009   GERD 06/26/2007   Irritable bowel syndrome 02/27/2010   constipation   OA (osteoarthritis) 12/13/2009   SEIZURE DISORDER 04/17/2010   in past Mariana Arn 4 years ago/no meds. actually vasovagal per neuro notes   Past Surgical History:  Procedure Laterality Date   ABDOMINAL HYSTERECTOMY     CESAREAN SECTION     TUBAL LIGATION     Patient Active Problem List   Diagnosis Date Noted   Osteoarthritis 02/04/2015   Vasovagal syncope 02/04/2015   Hyperlipidemia associated with type 2 diabetes mellitus ([place]) 02/04/2015   Allergic rhinitis 02/04/2015   Insomnia 02/04/2015   Irritable bowel syndrome 02/27/2010   Type II diabetes mellitus, well controlled ([place]) 04/06/2009   Asthma 06/26/2007   GERD 06/26/2007    PCP: Marin Olp, MD  REFERRING PROVIDER: Gregor Hams, MD  REFERRING DIAG: M25.511 (ICD-10-CM) - Pain in joint of right shoulder  THERAPY DIAG:  Decreased right shoulder range of motion  Chronic right shoulder pain  Muscle weakness (generalized)   ONSET DATE: 01/2021  SUBJECTIVE:                                                                                                                                                                                      SUBJECTIVE STATEMENT: Pt  states the pain feels the same as last time. It feels more intense this time around. She does feel some NT into the fingers. Pt states "strength issues in the R hand and it feels weak." Pt states she is a R sided sleeper. Pt states she does have pain with sleeping on that side. The R side feels weak. Pt states She does have neck pain that will travel down the arm and into the shoulder blades. Pt states the R shoulder hurts to move and feels like the  arm just "hangs" by the end of the day. Pt states the pain feels deep.Pt states the pain feels like a toothache. Pt denies cancer red flags. Pt denies systemic symptoms. Pt states the injection this time around only helped for a day or two. Pt states she has stopped previous PT exercises.   PERTINENT HISTORY: DM2, previous history of R shoulder injury  PAIN:  Are you having pain? Yes VAS scale: 6/10, Worst 8/10, Best 1/20 Pain location: R shoulder/neck PAIN TYPE: throbbing Pain description: constant  Aggravating factors: R SB, sitting at desk for too long, sleeping on R, reaching Relieving factors: Tylenol  PRECAUTIONS: None  FALLS:  Has patient fallen in last 6 months? 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    PLOF: Independent  PATIENT GOALS : Pt states she would like get back to exercise/movement in general.   OBJECTIVE:   DIAGNOSTIC FINDINGS:  IMPRESSION: Mild acromioclavicular osteoarthritis. Otherwise negative.  PATIENT SURVEYS:  SPADI;  57 / 80 = 71.3 %  COGNITION:  Overall cognitive status: Within functional limits for tasks assessed     SENSATION:  Light touch: Deficits C6 Distribution   PALPATION: TTP and hypersensitivity of R UT, infra, levator, and C/S paraspinals  POSTURE: Fwd head posture    C/S ROM: flexion WNL p! On R, ext WFL, L rot 75% with radiating, R rot 75% with radiating into C6 distribution, R SB 50%  L SB 50%     UPPER EXTREMITY AROM/PROM:  A/PROM Right 03/02/2021 Left 03/02/2021  Shoulder flexion 85 WFL  Shoulder ER C3   Shoulder abduction 82   Shoulder adduction    Shoulder IR L5   (Blank rows = not tested)  UPPER EXTREMITY MMT:  MMT Right 03/02/2021 Left 03/02/2021  Shoulder flexion Untested due to signficant increase in pain after AROM   Shoulder extension    Shoulder abduction    Shoulder adduction    Shoulder extension    Grip strength 20, 25,  22 lbs 30, 35, 30 lbs  (Blank rows = not tested)  C/S Special Tests: (+) Spurling (-) Distraction  JOINT MOBILITY TESTING:  C2-6 stiffness and pain with extension glide  TODAY'S TREATMENT:   Exercises Seated Scapular Retraction - 1 x daily - 7 x weekly - 3 sets - 10 reps Seated Cervical Retraction - 1 x daily - 7 x weekly - 3 sets - 10 reps Gentle Levator Scapulae Stretch - 2 x daily - 7 x weekly - 1 sets - 3 reps - 30 hold   PATIENT EDUCATION: Education details: MOI, diagnosis, prognosis, anatomy, posture, work ergonomics, exercise progression, DOMS expectations, muscle firing,  envelope of function, HEP, POC  Person educated: Patient Education method: Explanation, Demonstration, Tactile cues, Verbal cues, and Handouts Education comprehension: verbalized understanding, returned demonstration, verbal cues required, and tactile cues required   HOME EXERCISE PROGRAM: Access Code: [email or web address] URL: [email or web address] Date: 03/02/2021 Prepared by: Daleen Bo    ASSESSMENT:  CLINICAL IMPRESSION: Patient is a 60 y.o. female who was seen today for physical therapy evaluation and treatment for CC of R shoulder pain. Pt's s/s appear consistent with a cervical radiculopathy affecting R shoulder ROM and strength. Pt does demonstrates decreased grip strength on R as compared to L and paresthesia. Pt's ROM is restricted mostly by pain and does not present with capsular pattern restriction at present. Objective  impairments include decreased ROM, decreased strength, hypomobility,  increased fascial restrictions, increased muscle spasms, impaired flexibility, impaired UE functional use, improper body mechanics, postural dysfunction, and pain. These impairments are limiting patient from cleaning, community activity, driving, occupation, laundry, and dressing/grooming . Personal factors including Age, Behavior pattern, Fitness, Sex, and 1-2 comorbidities:    are also affecting patient's functional outcome. Patient will benefit from skilled PT to address above impairments and improve overall function.  REHAB POTENTIAL: Good  CLINICAL DECISION MAKING: Stable/uncomplicated  EVALUATION COMPLEXITY: Low   GOALS:   SHORT TERM GOALS:  STG Name Target Date Goal status  1 Pt will become independent with HEP in order to demonstrate synthesis of PT education.  Baseline:  03/16/2021 INITIAL  2 Pt will be able to demonstrate  full C/S ROM without pain in order to demonstrate functional improvement in UE/LE function for self-care and house hold duties.  Baseline:  03/23/2021 INITIAL  3 Pt will demonstrate at least a 15 pt improvement in SPADI in order to demonstrate a clinically significant change in shoulder pain and function  Baseline: 03/23/2021 INITIAL  4 Pt will report at least 2 pt reduction on VAS scale for pain in order to demonstrate functional improvement with household activity, self care, and ADL.  Baseline: 03/23/2021 INITIAL   LONG TERM GOALS:   LTG Name Target Date Goal status  1 Pt  will become independent with final HEP in order to demonstrate synthesis of PT education.  04/13/2021 INITIAL  2 Pt will be able to demonstrate symmetrical grip strength in order to demonstrate functional improvement in R UE function for self-care and house hold duties.  04/13/2021 INITIAL  3 Pt will be able to reach [facility] and carry/hold >/= 5 lbs in order to demonstrate functional improvement in R UE strength for  return to PLOF and exercise.   04/13/2021 INITIAL  4 Pt will be able to demonstrate/report ability to sleep for extended periods of time without pain in order to demonstrate functional improvement and tolerance to static positioning of UE.   04/13/2021 INITIAL   PLAN: PT FREQUENCY: 1-2x/week  PT DURATION: 8 weeks  PLANNED INTERVENTIONS: Therapeutic exercises, Therapeutic activity, Neuro Muscular re-education, Patient/Family education, Joint mobilization, Aquatic Therapy, Dry Needling, Electrical stimulation, Spinal mobilization, Cryotherapy, Moist heat, scar mobilization, Taping, Vasopneumatic device, Traction, Ultrasound, Ionotophoresis 4mg /ml Dexamethasone, and Manual therapy  PLAN FOR NEXT SESSION: review HEP, gentle manual traction, C/S joint mobs, rowing, shoulder isometrics  Daleen Bo PT, DPT 03/02/21 5:29 PM

## 2021-03-10 ENCOUNTER — Other Ambulatory Visit: Payer: Self-pay

## 2021-03-10 ENCOUNTER — Ambulatory Visit: Payer: Commercial Managed Care - PPO | Admitting: Internal Medicine

## 2021-03-10 ENCOUNTER — Encounter: Payer: Self-pay | Admitting: Internal Medicine

## 2021-03-10 VITALS — BP 140/88 | HR 82 | Ht 65.0 in | Wt 168.2 lb

## 2021-03-10 DIAGNOSIS — Z23 Encounter for immunization: Secondary | ICD-10-CM | POA: Diagnosis not present

## 2021-03-10 DIAGNOSIS — E119 Type 2 diabetes mellitus without complications: Secondary | ICD-10-CM | POA: Diagnosis not present

## 2021-03-10 DIAGNOSIS — E785 Hyperlipidemia, unspecified: Secondary | ICD-10-CM

## 2021-03-10 DIAGNOSIS — E663 Overweight: Secondary | ICD-10-CM

## 2021-03-10 LAB — POCT GLYCOSYLATED HEMOGLOBIN (HGB A1C): Hemoglobin A1C: 7.6 % — AB (ref 4.0–5.6)

## 2021-03-10 MED ORDER — OZEMPIC (1 MG/DOSE) 4 MG/3ML ~~LOC~~ SOPN
1.0000 mg | PEN_INJECTOR | SUBCUTANEOUS | 3 refills | Status: DC
Start: 2021-03-10 — End: 2021-05-23

## 2021-03-10 MED ORDER — INSULIN LISPRO (1 UNIT DIAL) 100 UNIT/ML (KWIKPEN): PEN_INJECTOR | SUBCUTANEOUS | 3 refills | Status: DC

## 2021-03-10 MED ORDER — LANTUS SOLOSTAR 100 UNIT/ML ~~LOC~~ SOPN: PEN_INJECTOR | SUBCUTANEOUS | 3 refills | Status: DC

## 2021-03-10 NOTE — Progress Notes (Signed)
Subjective:     Patient ID: Denise Beard, female   DOB: 04-22-1961, 60 y.o.   MRN: [id number]  This visit occurred during the SARS-CoV-2 public health emergency.  Safety protocols were in place, including screening questions prior to the visit, additional usage of staff PPE, and extensive cleaning of exam room while observing appropriate contact time as indicated for disinfecting solutions.   Diabetes Denise Beard is a pleasant 59 y.o. woman, returning for f/u for DM2, dx 2012, insulin-dependent, uncontrolled, without long-term complications. Last visit 6 months ago.  Interim history: Her sugars increased at the end of last year due to steroid injections for frozen shoulder. She has the last inj 01/2021 >> 400-500s. Now in PT. Before last visit, she developed back pain with sciatica and she also relaxed her diet, eating potato chips every night.  Her sugars started to increase again. She has some blurry vision. No increased urination, nausea, chest pain.  Reviewed HbA1c levels: Lab Results  Component Value Date   HGBA1C 7.8 (A) 09/09/2020   HGBA1C 10.2 (A) 02/26/2020   HGBA1C 10.2 (A) 08/28/2019   She is on: - Metformin 1000 mg 2x a day with meals >> 2000 mg with dinner - Trulicity 1.5 >> 3 mg weekly  - Lantus 30 >> 40 units after dinner  - Humalog 10  units 15 minutes before dinner >> (10-)14 units before the 3 meals >> 8 to 10 units before breakfast and lunch and 12 to 14 units before dinner >> mostly taking this with L and D: 14 We stopped glipizide due to dizziness. She was on saxagliptin/metformin XR 09/998 mg (Kombyglyze) in the past. She was on JanuMet 50/1000 bid in 2015 >> lightheaded, HA, blurry vision.  She is checking sugars 1-3 times a day: - am:  134, 163, 222-303 >> 197-243 (chips at night) >> 73-263, 405 - 2h after b'fast: n/c >> 306, 377 >> 59, 77, 96, 122 >> 111-262, 382 - before lunch:  182-370 >> 90-172 >> 55, 104, 148 >> 99-321 - 2h after lunch: 180 >> 159-224 >>  78, 100-179 >> 160-300 - before dinner: 759, 140-206 >> 100-145, 211, 224 (snack) >> n/c - 2h after dinner:  /c >> 181-336 >> 138 >> 160-190 >> 227, 259 - bedtime:  n/c >> 132-196 >> n/c >> 216-381 >> 199  >> n/c - nighttime: 109-188 >> n/c Lowest: 69 >> 125 >> 154 >> 56 (oral surgery) >> 55 >> 73. Highest: 254 - [employer] >> 492 >> 331 >> 243 >> 500s.  Meter:  One Touch ultra 2  She saw nutrition in the past.  No CKD; latest BUN/creatinine: Lab Results  Component Value Date   BUN 6 11/09/2020   CREATININE 0.65 11/09/2020   No MAU: Lab Results  Component Value Date   MICRALBCREAT 0.8 11/09/2020   MICRALBCREAT 1.1 10/23/2019   MICRALBCREAT 8 05/27/2018   MICRALBCREAT 0.9 05/24/2017   MICRALBCREAT 0.8 04/06/2016   MICRALBCREAT 2.4 02/04/2015   MICRALBCREAT 0.4 10/30/2013   MICRALBCREAT 18.2 07/17/2013   MICRALBCREAT 1.9 10/06/2012   MICRALBCREAT 1.5 07/15/2012   + HL; lipids:  Lab Results  Component Value Date   CHOL 133 11/09/2020   HDL 39.00 (L) 11/09/2020   LDLCALC 77 11/09/2020   LDLDIRECT 179.6 07/17/2013   TRIG 81.0 11/09/2020   CHOLHDL 3 11/09/2020  On Zocor 20.  - Last eye exam 04/2019: No Denise reportedly (Denise Beard - [facility]).  Coming up 05/2021.  -No numbness or  tingling in her feet.  She also has a history of anxiety, asthma, GERD, constipation; polyarthropathy; seizure disorder.   Review of Systems + See HPI  I reviewed pt's medications, allergies, PMH, social hx, family hx, and changes were documented in the history of present illness. Otherwise, unchanged from my initial visit note.  Past Medical History:  Diagnosis Date   ANXIETY 03/17/2007   in past   ASTHMA 06/26/2007   DIAB W/O COMP TYPE II/UNS NOT STATED UNCNTRL 04/06/2009   GERD 06/26/2007   Irritable bowel syndrome 02/27/2010   constipation   OA (osteoarthritis) 12/13/2009   SEIZURE DISORDER 04/17/2010   in past /over 4 years ago/no meds. actually vasovagal per neuro notes    Past Surgical History:  Procedure Laterality Date   ABDOMINAL HYSTERECTOMY     CESAREAN SECTION     TUBAL LIGATION     Social History   Socioeconomic History   Marital status: Divorced    Spouse name: Not on file   Number of children: Not on file   Years of education: Not on file   Highest education level: Not on file  Occupational History   Not on file  Tobacco Use   Smoking status: Never   Smokeless tobacco: Never  Substance and Sexual Activity   Alcohol use: No   Drug use: No   Sexual activity: Not on file  Other Topics Concern   Not on file  Social History Narrative   Divorced/Single. 2 chldren. 2 grandkids   Moved in with mother to help her      Works; Nurse, mental health for background checks [place]      Hobbies: church very active      Social Determinants of Radio broadcast assistant Strain: Not on [employer] Insecurity: Not on file  Transportation Needs: Not on file  Physical Activity: Not on file  Stress: Not on file  Social Connections: Not on file  Intimate Partner Violence: Not on file   Current Outpatient Medications on File Prior to Visit  Medication Sig Dispense Refill   albuterol (VENTOLIN HFA) 108 (90 Base) MCG/ACT inhaler Inhale 2 puffs into the lungs every 6 (six) hours as needed for wheezing or shortness of breath. 1 each 2   azelastine (ASTELIN) 0.1 % nasal spray Place 2 sprays into both nostrils 2 (two) times daily. 30 mL 12   Dulaglutide (TRULICITY) 3 IW/5.8KD SOPN Inject 3 mg into the skin once a week. 6 mL 3   gabapentin (NEURONTIN) 300 MG capsule Take 1 capsule (300 mg total) by mouth 3 (three) times daily as needed. 90 capsule 3   insulin glargine (LANTUS SOLOSTAR) 100 UNIT/ML Solostar Pen INJECT 40 UNITS INTO THE SKIN DAILY AT 10 PM. 45 mL 3   insulin lispro (HUMALOG KWIKPEN) 100 UNIT/ML KwikPen INJECT 8-14 units  INTO THE SKIN 3x a day before meals 30 mL 3   Insulin Pen Needle (BD PEN NEEDLE NANO 2ND GEN) 32G  X 4 MM MISC USE ONCE DAILY AS DIRECTED 100 each 0   metFORMIN (GLUCOPHAGE) 1000 MG tablet Take 1 tablet (1,000 mg total) by mouth 2 (two) times daily with a meal. 180 tablet 3   simvastatin (ZOCOR) 20 MG tablet TAKE 1 TABLET BY MOUTH EVERYDAY AT BEDTIME 90 tablet 3   zolpidem (AMBIEN) 10 MG tablet TAKE 1 TABLET BY MOUTH EVERY DAY AT BEDTIME AS NEEDED FOR SLEEP 30 tablet 5   No current facility-administered medications on file  prior to visit.   Allergies  Allergen Reactions   Iodine     Per the patient. Had an oral solution in the 90's that gave her a bad reaction of hallucinations.    Family History  Problem Relation Age of Onset   Thyroid disease Mother    Hyperlipidemia Mother    Breast cancer Mother 65   Skin cancer Father        not melanoma- passed at 86   Colon cancer Maternal Uncle    Stomach cancer Maternal Uncle    Lung cancer Maternal Uncle    Cancer Maternal Grandfather        unknown    Objective:   Physical Exam  BP 140/88 (BP Location: Right Arm, Patient Position: Sitting, Cuff Size: Normal)   Pulse 82   Ht 5\' 5"  (1.651 m)   Wt 168 lb 3.2 oz (76.3 kg)   SpO2 98%   BMI 27.99 kg/m  There is no height or weight on file to calculate BMI.  Wt Readings from Last 3 Encounters:  03/10/21 168 lb 3.2 oz (76.3 kg)  02/15/21 166 lb (75.3 kg)  11/09/20 165 lb 6.4 oz (75 kg)   Constitutional: overweight, in NAD Eyes: PERRLA, EOMI, no exophthalmos ENT: moist mucous membranes, no thyromegaly, no cervical lymphadenopathy Cardiovascular: RRR, No MRG Respiratory: CTA B Gastrointestinal: abdomen soft, NT, ND, BS+ Musculoskeletal: no deformities, strength intact in all 4 Skin: moist, warm, no rashes Neurological: no tremor with outstretched hands, DTR normal in all 4  Assessment:     1. DM2, insulin-dependent, uncontrolled, without long term complications, but with hyperglycemia  Type 1 diabetes investigation was negative: Component     Latest Ref Rng & Units  01/24/2018  ZNT8 Antibodies     U/mL <15  Islet Cell Ab     Neg:<1:1 Negative  Glutamic Acid Decarb Ab     <5 IU/mL <5  C-Peptide     0.80 - 3.85 ng/mL 2.07  Glucose, Plasma     65 - 99 mg/dL 134 (H)   2. HL  3.  Overweight    Plan:     Pt with longstanding, uncontrolled, type 2 diabetes, on basal/bolus insulin regimen, metformin, and also weekly GLP-1 receptor agonist, which much worse control last year after starting steroid injections for frozen shoulder.  She was also not taking her Humalog consistently, not checking blood sugars as she relaxed her diet.  HbA1c increased to 10.2%, however, at last visit, this decreased to 7.8%.  We again discussed about improving diet at last visit but, as sugars were improving after breakfast and lunch, we decreased the Humalog doses with these meals.  Since sugars were high in the morning I advised her to move the entire metformin dose with dinner.  We could not increase Trulicity at that time as she had several 3 mg pens at home.   -At this visit, sugars remain quite high, with many values in the 200s and 300s, as she continues to get steroid injections since last visit and also continued with dietary indiscretions.  She continues to eat chips after dinner every night.  Also, she misunderstood instructions at last visit and actually stopped her morning Humalog dose, which she now only uses occasionally, for higher blood sugars in the morning. -For now, we discussed about using Humalog consistently before meals, and she may even need higher doses with larger meals.  Also, she is now off the Trulicity and I suggested to change from this  to Ozempic, for a stronger effect on her blood sugars.  It would be mandatory for her to stop eating after dinner for better blood sugars in the morning. - I advised her to: Patient Instructions  Please continue: - Metformin 2000 mg with dinner - Lantus 40 units after dinner   Use: - Humalog  8-12 units 15 minutes  before b'fast 14-16 units 15 minutes before lunch 14-16 units 15 minutes before dinner  Change Trulicity to Ozempic 1 mg weekly.  STOP eating after dinner.   Please return in 3-4 months with your sugar log.   - we checked her HbA1c: 7.6% (slightly lower) - advised to check sugars at different times of the day - 4x a day, rotating check times - advised for yearly eye exams >> she is not UTD - return to clinic in 3-4 months  2. HL  -Reviewed latest lipid panel from 10/2020: LDL at goal, HDL slightly low: Lab Results  Component Value Date   CHOL 133 11/09/2020   HDL 39.00 (L) 11/09/2020   LDLCALC 77 11/09/2020   LDLDIRECT 179.6 07/17/2013   TRIG 81.0 11/09/2020   CHOLHDL 3 11/09/2020  -Continue Zocor 20 mg daily without side effects  3.  Overweight -We will continue the GLP-1 receptor agonist (but will switch from Trulicity to Ozempic) which should also help with weight loss -She lost 3 pounds before last visit and gained 2 pounds since then -At last visit, I advised her to stop chips and eating after dinner  Philemon Kingdom, MD PhD Gerty End visitocrinology

## 2021-03-10 NOTE — Patient Instructions (Addendum)
Please continue: - Metformin 2000 mg with dinner - Lantus 40 units after dinner   Use: - Humalog  8-12 units 15 minutes before b'fast 14-16 units 15 minutes before lunch 14-16 units 15 minutes before dinner  Change Trulicity to Ozempic 1 mg weekly.  STOP eating after dinner.   Please return in 3-4 months with your sugar log.

## 2021-03-16 ENCOUNTER — Encounter (HOSPITAL_BASED_OUTPATIENT_CLINIC_OR_DEPARTMENT_OTHER): Payer: Commercial Managed Care - PPO | Admitting: Physical Therapy

## 2021-03-17 ENCOUNTER — Ambulatory Visit: Payer: Commercial Managed Care - PPO | Admitting: Family Medicine

## 2021-03-30 ENCOUNTER — Other Ambulatory Visit: Payer: Self-pay

## 2021-03-30 ENCOUNTER — Encounter (HOSPITAL_BASED_OUTPATIENT_CLINIC_OR_DEPARTMENT_OTHER): Payer: Self-pay | Admitting: Physical Therapy

## 2021-03-30 ENCOUNTER — Ambulatory Visit (HOSPITAL_BASED_OUTPATIENT_CLINIC_OR_DEPARTMENT_OTHER): Payer: Commercial Managed Care - PPO | Attending: Family Medicine | Admitting: Physical Therapy

## 2021-03-30 DIAGNOSIS — M6281 Muscle weakness (generalized): Secondary | ICD-10-CM | POA: Insufficient documentation

## 2021-03-30 DIAGNOSIS — G8929 Other chronic pain: Secondary | ICD-10-CM | POA: Insufficient documentation

## 2021-03-30 DIAGNOSIS — M25611 Stiffness of right shoulder, not elsewhere classified: Secondary | ICD-10-CM | POA: Insufficient documentation

## 2021-03-30 DIAGNOSIS — M25511 Pain in right shoulder: Secondary | ICD-10-CM | POA: Diagnosis present

## 2021-03-30 NOTE — Therapy (Signed)
OUTPATIENT PHYSICAL THERAPY TREATMENT NOTE   Patient Name: Denise Beard MRN: [id number] DOB:December 07, 1960, 60 y.o., female Today's Date: 03/30/2021  PCP: Marin Olp, MD REFERRING PROVIDER: Marin Olp, MD   PT End of Session - 03/30/21 1517     Visit Number 2    Number of Visits 13    Date for PT Re-Evaluation 05/31/21    Authorization Type UHC    PT Start Time 6578    PT Stop Time 4696    PT Time Calculation (min) 31 min    Activity Tolerance Patient tolerated treatment well;Patient limited by pain    Behavior During Therapy [facility] for tasks assessed/performed             Past Medical History:  Diagnosis Date   ANXIETY 03/17/2007   in past   ASTHMA 06/26/2007   DIAB W/O COMP TYPE II/UNS NOT STATED UNCNTRL 04/06/2009   GERD 06/26/2007   Irritable bowel syndrome 02/27/2010   constipation   OA (osteoarthritis) 12/13/2009   SEIZURE DISORDER 04/17/2010   in past Mariana Arn 4 years ago/no meds. actually vasovagal per neuro notes   Past Surgical History:  Procedure Laterality Date   ABDOMINAL HYSTERECTOMY     CESAREAN SECTION     TUBAL LIGATION     Patient Active Problem List   Diagnosis Date Noted   Osteoarthritis 02/04/2015   Vasovagal syncope 02/04/2015   Hyperlipidemia associated with type 2 diabetes mellitus ([place]) 02/04/2015   Allergic rhinitis 02/04/2015   Insomnia 02/04/2015   Irritable bowel syndrome 02/27/2010   Type II diabetes mellitus, well controlled ([place]) 04/06/2009   Asthma 06/26/2007   GERD 06/26/2007    REFERRING DIAG: M25.511 (ICD-10-CM) - Pain in joint of right shoulder  THERAPY DIAG:  Decreased right shoulder range of motion  Chronic right shoulder pain  Muscle weakness (generalized)  PERTINENT HISTORY: PERTINENT HISTORY: DM2, previous history of R shoulder injury    SUBJECTIVE: Pt states the pain feels much better but will still have pain at night. She will feel that "throbbing." The neck feels much better. She states the NT is  gone at this point.   PAIN:  Are you having pain? Yes VAS scale: 3/10 Pain location: R shoulder, lateral shoulder Pain orientation: Right  PAIN TYPE: dull  PLOF: Independent   PATIENT GOALS : Pt states she would like get back to exercise/movement in general.     OBJECTIVE:   TODAY'S TREATMENT: TODAY'S TREATMENT:   STM: R UT, levator Joint mob: R GHJ inf grade III (causes thumb NT, d/c) no report of pain change following   Exercises Seated Scapular Retraction - 20x Seated Cervical Retraction - 15x 3s Gentle Levator Scapulae Stretch - 30s 2x Bilat shoulder ER RTB 2x10 Rowing RTB 2x10      PATIENT EDUCATION: Education details: MOI, diagnosis, prognosis, anatomy, posture, work ergonomics, exercise progression, DOMS expectations, muscle firing,  envelope of function, HEP, POC   Person educated: Patient Education method: Explanation, Demonstration, Tactile cues, Verbal cues, and Handouts Education comprehension: verbalized understanding, returned demonstration, verbal cues required, and tactile cues required     HOME EXERCISE PROGRAM: Access Code: [email or web address] URL: [email or web address] Date: 03/02/2021 Prepared by: Daleen Bo       ASSESSMENT:   CLINICAL IMPRESSION: Pt presents with signficant improved R shoulder ROM as well as decrease in pain as compared to initial evaluation. Pt able to incorporate new postural strengthening activity without increased pain. Pt did not respond to manual therapy suggesting more cervical involvement  still. Given report of no NT, cervical distraction was deferred until next session. Plan to trial cervical distraction and/or joint mobs, if needed. Pt would benefit from continued skilled therapy in order to reach goals and maximize functional postural strength and ROM for full return to PLOF.    Objective impairments include decreased ROM, decreased strength, hypomobility, increased fascial restrictions, increased muscle spasms,  impaired flexibility, impaired UE functional use, improper body mechanics, postural dysfunction, and pain. These impairments are limiting patient from cleaning, community activity, driving, occupation, laundry, and dressing/grooming . Personal factors including Age, Behavior pattern, Fitness, Sex, and 1-2 comorbidities:    are also affecting patient's functional outcome.   REHAB POTENTIAL: Good   CLINICAL DECISION MAKING: Stable/uncomplicated   EVALUATION COMPLEXITY: Low     GOALS:     SHORT TERM GOALS:   STG Name Target Date Goal status  1 Pt will become independent with HEP in order to demonstrate synthesis of PT education.   Baseline:  03/16/2021 INITIAL  2 Pt will be able to demonstrate  full C/S ROM without pain in order to demonstrate functional improvement in UE/LE function for self-care and house hold duties.   Baseline:  03/23/2021 INITIAL  3 Pt will demonstrate at least a 15 pt improvement in SPADI in order to demonstrate a clinically significant change in shoulder pain and function   Baseline: 03/23/2021 INITIAL  4 Pt will report at least 2 pt reduction on VAS scale for pain in order to demonstrate functional improvement with household activity, self care, and ADL.   Baseline: 03/23/2021 INITIAL    LONG TERM GOALS:    LTG Name Target Date Goal status  1 Pt  will become independent with final HEP in order to demonstrate synthesis of PT education.   04/13/2021 INITIAL  2 Pt will be able to demonstrate symmetrical grip strength in order to demonstrate functional improvement in R UE function for self-care and house hold duties.   04/13/2021 INITIAL  3 Pt will be able to reach [facility] and carry/hold >/= 5 lbs in order to demonstrate functional improvement in R UE strength for return to PLOF and exercise.    04/13/2021 INITIAL  4 Pt will be able to demonstrate/report ability to sleep for extended periods of time without pain in order to demonstrate functional improvement and  tolerance to static positioning of UE.    04/13/2021 INITIAL    PLAN: PT FREQUENCY: 1-2x/week   PT DURATION: 8 weeks   PLANNED INTERVENTIONS: Therapeutic exercises, Therapeutic activity, Neuro Muscular re-education, Patient/Family education, Joint mobilization, Aquatic Therapy, Dry Needling, Electrical stimulation, Spinal mobilization, Cryotherapy, Moist heat, scar mobilization, Taping, Vasopneumatic device, Traction, Ultrasound, Ionotophoresis 4mg /ml Dexamethasone, and Manual therapy   PLAN FOR NEXT SESSION: review HEP, gentle manual traction, C/S joint mobs, self thoracic mobilization, open book, wall push up  Daleen Bo PT, DPT 03/30/21 3:49 PM

## 2021-05-02 ENCOUNTER — Encounter: Payer: Self-pay | Admitting: Family

## 2021-05-02 ENCOUNTER — Other Ambulatory Visit: Payer: Self-pay

## 2021-05-02 ENCOUNTER — Ambulatory Visit: Payer: Commercial Managed Care - PPO | Admitting: Family

## 2021-05-02 VITALS — BP 133/90 | HR 108 | Temp 98.0°F | Ht 65.0 in | Wt 166.2 lb

## 2021-05-02 DIAGNOSIS — J069 Acute upper respiratory infection, unspecified: Secondary | ICD-10-CM | POA: Insufficient documentation

## 2021-05-02 DIAGNOSIS — J4540 Moderate persistent asthma, uncomplicated: Secondary | ICD-10-CM | POA: Diagnosis not present

## 2021-05-02 MED ORDER — ALBUTEROL SULFATE HFA 108 (90 BASE) MCG/ACT IN AERS: 2.0000 | INHALATION_SPRAY | Freq: Four times a day (QID) | RESPIRATORY_TRACT | 2 refills | Status: DC | PRN

## 2021-05-02 NOTE — Progress Notes (Signed)
Subjective:     Patient ID: Denise Beard, female    DOB: [birth year], 60 y.o.   MRN: [id number]  Chief Complaint  Patient presents with   Nasal Congestion    Symptoms started last Wednesday. She has taken Mucinex, and nasal spray and OTC cough medicine. She has taken 2 Covid tests which were negative. She denies fever.    Sore Throat   Generalized Body Aches   Cough   Headache     HPI: Upper Respiratory Infection: Symptoms include achiness, congestion, no  fever, non productive cough, post nasal drip, and sore throat.  Onset of symptoms was 2 days ago, unchanged since that time. She is drinking moderate amounts of fluids. Evaluation to date: none.  Treatment to date: cough suppressants.    Health Maintenance Due  Topic Date Due   Zoster Vaccines- Shingrix (1 of 2) Never done   OPHTHALMOLOGY EXAM  04/22/2020   PAP SMEAR-Modifier  02/19/2021   MAMMOGRAM  04/08/2021    Past Medical History:  Diagnosis Date   ANXIETY 03/17/2007   in past   ASTHMA 06/26/2007   DIAB W/O COMP TYPE II/UNS NOT STATED UNCNTRL 04/06/2009   GERD 06/26/2007   Irritable bowel syndrome 02/27/2010   constipation   OA (osteoarthritis) 12/13/2009   SEIZURE DISORDER 04/17/2010   in past /over 4 years ago/no meds. actually vasovagal per neuro notes    Past Surgical History:  Procedure Laterality Date   ABDOMINAL HYSTERECTOMY     CESAREAN SECTION     TUBAL LIGATION      Outpatient Medications Prior to Visit  Medication Sig Dispense Refill   azelastine (ASTELIN) 0.1 % nasal spray Place 2 sprays into both nostrils 2 (two) times daily. 30 mL 12   gabapentin (NEURONTIN) 300 MG capsule Take 1 capsule (300 mg total) by mouth 3 (three) times daily as needed. 90 capsule 3   insulin glargine (LANTUS SOLOSTAR) 100 UNIT/ML Solostar Pen INJECT 40 UNITS INTO THE SKIN DAILY AT 10 PM. 45 mL 3   insulin lispro (HUMALOG KWIKPEN) 100 UNIT/ML KwikPen INJECT 10-16 units  INTO THE SKIN 3x a day before meals 30 mL 3    Insulin Pen Needle (BD PEN NEEDLE NANO 2ND GEN) 32G X 4 MM MISC USE ONCE DAILY AS DIRECTED 100 each 0   metFORMIN (GLUCOPHAGE) 1000 MG tablet Take 1 tablet (1,000 mg total) by mouth 2 (two) times daily with a meal. 180 tablet 3   Semaglutide, 1 MG/DOSE, (OZEMPIC, 1 MG/DOSE,) 4 MG/3ML SOPN Inject 1 mg into the skin once a week. 9 mL 3   simvastatin (ZOCOR) 20 MG tablet TAKE 1 TABLET BY MOUTH EVERYDAY AT BEDTIME 90 tablet 3   zolpidem (AMBIEN) 10 MG tablet TAKE 1 TABLET BY MOUTH EVERY DAY AT BEDTIME AS NEEDED FOR SLEEP 30 tablet 5   albuterol (VENTOLIN HFA) 108 (90 Base) MCG/ACT inhaler Inhale 2 puffs into the lungs every 6 (six) hours as needed for wheezing or shortness of breath. 1 each 2   No facility-administered medications prior to visit.    Allergies  Allergen Reactions   Iodine     Per the patient. Had an oral solution in the 90's that gave her a bad reaction of hallucinations.         Objective:    Physical Exam Vitals and nursing note reviewed.  Constitutional:      Appearance: Normal appearance.  Cardiovascular:     Rate and Rhythm: Normal rate and regular rhythm.  Pulmonary:     Effort: Pulmonary effort is normal.     Breath sounds: Normal breath sounds.  Musculoskeletal:        General: Normal range of motion.  Skin:    General: Skin is warm and dry.  Neurological:     Mental Status: She is alert.  Psychiatric:        Mood and Affect: Mood normal.        Behavior: Behavior normal.    BP 133/90    Pulse (!) 108    Temp 98 F (36.7 C) (Temporal)    Ht 5\' 5"  (1.651 m)    Wt 166 lb 3.2 oz (75.4 kg)    SpO2 97%    BMI 27.66 kg/m  Wt Readings from Last 3 Encounters:  05/02/21 166 lb 3.2 oz (75.4 kg)  03/10/21 168 lb 3.2 oz (76.3 kg)  02/15/21 166 lb (75.3 kg)       Assessment & Plan:   Problem List Items Addressed This Visit       Respiratory   Asthma   Relevant Medications   albuterol (VENTOLIN HFA) 108 (90 Base) MCG/ACT inhaler   Viral upper  respiratory tract infection - Primary    Hx of Asthma, rescue inhaler expired. Reports having a virus a week ago, felt better for 2 days, now restarting symptoms, cough being the worst sx, covid neg at home. Will refill inhaler, advised to use at least 2-3x/day, reports using Symbicort about a year ago. Former smoker many years ago. Samples of Norel AD given, advised on use & SE, tylenol or Ibuprofen for aches/pain/fever.       Meds ordered this encounter  Medications   albuterol (VENTOLIN HFA) 108 (90 Base) MCG/ACT inhaler    Sig: Inhale 2 puffs into the lungs every 6 (six) hours as needed for wheezing or shortness of breath.    Dispense:  1 each    Refill:  2    Order Specific Question:   Supervising Provider    Answer:   ANDY, CAMILLE L [[id number]]

## 2021-05-02 NOTE — Assessment & Plan Note (Addendum)
Hx of Asthma, rescue inhaler expired. Reports having a virus a week ago, felt better for 2 days, now restarting symptoms, cough being the worst sx, covid neg at home. Will refill inhaler, advised to use at least 2-3x/day, reports using Symbicort about a year ago. Former smoker many years ago. Samples of Norel AD given, advised on use & SE, tylenol or Ibuprofen for aches/pain/fever.

## 2021-05-02 NOTE — Patient Instructions (Signed)
It was very nice to see you today!  As discussed, pick up the Albuterol inhaler and use this at least twice a day, can use 4 times/day for the next 5-7 days, then as needed. I have also given you samples of Norel AD, take 1 pill twice a day to help decrease your sinus drainage and help with nasal congestion. You can continue to take Tylenol 1,000mg  or Ibuprofen up to 600mg  3 times per day to help with any aches/fever/sore throat pain.  PLEASE NOTE:  If you had any lab tests please let us know if you have not heard back within a few days. You may see your results on MyChart before we have a chance to review them but we will give you a call once they are reviewed by [place]. If we ordered any referrals today, please let us know if you have not heard from their office within the next week.   Please try these tips to maintain a healthy lifestyle:  Eat most of your calories during the day when you are active. Eliminate processed foods including packaged sweets (pies, cakes, cookies), reduce intake of potatoes, white bread, white pasta, and white rice. Look for whole grain options, oat flour or almond flour.  Each meal should contain half fruits/vegetables, one quarter protein, and one quarter carbs (no bigger than a computer mouse).  Cut down on sweet beverages. This includes juice, soda, and sweet tea. Also watch fruit intake, though this is a healthier sweet option, it still contains natural sugar! Limit to 3 servings daily.  Drink at least 1 glass of water with each meal and aim for at least 8 glasses per day  Exercise at least 150 minutes every week.

## 2021-05-04 ENCOUNTER — Telehealth: Payer: Self-pay

## 2021-05-04 DIAGNOSIS — U071 COVID-19: Secondary | ICD-10-CM | POA: Insufficient documentation

## 2021-05-04 NOTE — Telephone Encounter (Signed)
Tested positive for Covid yesterday-FYI

## 2021-05-04 NOTE — Addendum Note (Signed)
Addended byJeanie Sewer on: 05/04/2021 01:22 PM   Modules accepted: Orders

## 2021-05-04 NOTE — Telephone Encounter (Signed)
Denise Beard saw her for this illness earlier this week- sent her a message for this on teams

## 2021-05-05 ENCOUNTER — Other Ambulatory Visit: Payer: Self-pay | Admitting: Family

## 2021-05-05 DIAGNOSIS — U071 COVID-19: Secondary | ICD-10-CM

## 2021-05-05 MED ORDER — MOLNUPIRAVIR EUA 200MG CAPSULE: 4.0000 | ORAL_CAPSULE | Freq: Two times a day (BID) | ORAL | 0 refills | Status: AC

## 2021-05-05 NOTE — Telephone Encounter (Signed)
Pt agreed with the plan to start antiviral. She voiced understanding.

## 2021-05-05 NOTE — Telephone Encounter (Signed)
Lvm for the pt to call the office back. 

## 2021-05-11 MED ORDER — MOLNUPIRAVIR EUA 200MG CAPSULE: 4.0000 | ORAL_CAPSULE | Freq: Two times a day (BID) | ORAL | 0 refills | Status: AC

## 2021-05-11 NOTE — Addendum Note (Signed)
Addended byJeanie Sewer on: 05/11/2021 01:15 PM   Modules accepted: Orders

## 2021-05-15 ENCOUNTER — Other Ambulatory Visit: Payer: Self-pay | Admitting: Family Medicine

## 2021-05-18 NOTE — Telephone Encounter (Signed)
Patient calling to check on refill for medication

## 2021-05-19 ENCOUNTER — Other Ambulatory Visit: Payer: Self-pay | Admitting: Family Medicine

## 2021-05-19 NOTE — Telephone Encounter (Signed)
Patient has called back in regard.  I informed patient that this looks like it needs to be approved by Dr. Yong Channel.  Advised patient not to wait until day of to request refill request to avoid going without medication.

## 2021-05-23 ENCOUNTER — Other Ambulatory Visit: Payer: Self-pay | Admitting: Internal Medicine

## 2021-05-23 ENCOUNTER — Telehealth: Payer: Self-pay | Admitting: Internal Medicine

## 2021-05-23 DIAGNOSIS — E119 Type 2 diabetes mellitus without complications: Secondary | ICD-10-CM

## 2021-05-23 MED ORDER — TRULICITY 1.5 MG/0.5ML ~~LOC~~ SOAJ: 3.0000 mg | SUBCUTANEOUS | 1 refills | Status: DC

## 2021-05-23 NOTE — Telephone Encounter (Signed)
If the price cannot be decreased with an Ozempic coupon, will need to go back to Trulicity 3 mg weekly.

## 2021-05-23 NOTE — Telephone Encounter (Signed)
Patient called re: Denise Beard is too expensive. Patient requests to go back to using Trulicity instead of Ozempic (Patient states unless there is a way of Ozempic becoming more affordable).  Patient states that if Dr. Lilia Argue is okay with Patient going back on Trulicity, Patient requests a new RX with refills for Trulicity be sent to: [id number] - [place], [place] Phone:  [phone]  Fax:  [phone]

## 2021-05-23 NOTE — Telephone Encounter (Signed)
Trulicity sent to preferred pharmacy.

## 2021-05-24 NOTE — Telephone Encounter (Signed)
We will send a 1.5 mg for just 1 month, hopefully afterwards we can switch back to the 3 mg weekly

## 2021-05-26 LAB — HM DIABETES EYE EXAM

## 2021-06-23 ENCOUNTER — Other Ambulatory Visit: Payer: Self-pay | Admitting: Obstetrics and Gynecology

## 2021-06-23 DIAGNOSIS — N6320 Unspecified lump in the left breast, unspecified quadrant: Secondary | ICD-10-CM

## 2021-06-26 ENCOUNTER — Ambulatory Visit
Admission: RE | Admit: 2021-06-26 | Discharge: 2021-06-26 | Disposition: A | Discharge status: Home / Self Care | Payer: Commercial Managed Care - PPO | Source: Ambulatory Visit | Attending: Obstetrics and Gynecology | Admitting: Obstetrics and Gynecology

## 2021-06-26 DIAGNOSIS — N6320 Unspecified lump in the left breast, unspecified quadrant: Secondary | ICD-10-CM

## 2021-07-14 ENCOUNTER — Ambulatory Visit (INDEPENDENT_AMBULATORY_CARE_PROVIDER_SITE_OTHER): Payer: Commercial Managed Care - PPO | Admitting: Internal Medicine

## 2021-07-14 ENCOUNTER — Other Ambulatory Visit: Payer: Self-pay

## 2021-07-14 ENCOUNTER — Encounter: Payer: Self-pay | Admitting: Internal Medicine

## 2021-07-14 VITALS — BP 118/78 | HR 91 | Ht 65.0 in | Wt 159.2 lb

## 2021-07-14 DIAGNOSIS — E119 Type 2 diabetes mellitus without complications: Secondary | ICD-10-CM | POA: Diagnosis not present

## 2021-07-14 DIAGNOSIS — E663 Overweight: Secondary | ICD-10-CM

## 2021-07-14 DIAGNOSIS — E785 Hyperlipidemia, unspecified: Secondary | ICD-10-CM

## 2021-07-14 LAB — POCT GLYCOSYLATED HEMOGLOBIN (HGB A1C): Hemoglobin A1C: 7 % — AB (ref 4.0–5.6)

## 2021-07-14 MED ORDER — LANTUS SOLOSTAR 100 UNIT/ML ~~LOC~~ SOPN: PEN_INJECTOR | SUBCUTANEOUS | 3 refills | Status: DC

## 2021-07-14 NOTE — Patient Instructions (Addendum)
Please continue: - Metformin 2000 mg with dinner - Ozempic 1 mg weekly  Decrease: - Lantus 35 units after dinner   Please take: - Humalog  8-12 units 15 minutes before b'fast 14-16 units 15 minutes before lunch 14-16 units 15 minutes before dinner  STOP eating after dinner.   Please return in 3-4 months with your sugar log.

## 2021-07-14 NOTE — Progress Notes (Signed)
Subjective:     Patient ID: Denise Beard, female   DOB: [birth year], 61 y.o.   MRN: [id number]  This visit occurred during the SARS-CoV-2 public health emergency.  Safety protocols were in place, including screening questions prior to the visit, additional usage of staff PPE, and extensive cleaning of exam room while observing appropriate contact time as indicated for disinfecting solutions.   Diabetes [name] is a pleasant 61 y.o. woman, returning for f/u for DM2, dx 2012, insulin-dependent, uncontrolled, without long-term complications. Last visit 4 months ago.  Interim history: Her sugars increased at the end of last year due to steroid injections for frozen shoulder >> 400-500s.  They improved afterwards. No increased urination, nausea, chest pain.  She does have some blurry vision. She had COVID-19 in 04/2021.  She recovered well, but still has some fatigue, SOB. She was being investigated for a mass in the left breast >> felt on palpation, but not found on imaging  Reviewed HbA1c levels: Lab Results  Component Value Date   HGBA1C 7.6 (A) 03/10/2021   HGBA1C 7.8 (A) 09/09/2020   HGBA1C 10.2 (A) 02/26/2020   She is on: - Metformin 1000 mg 2x a day with meals >> 2000 mg with dinner - Trulicity 1.5 >> 3 mg weekly  >> Ozempic 1 mg weekly - Lantus 30 >> 40 units after dinner  - Humalog 10  units 15 minutes before dinner >> ... mostly taking this with L and D: 14 We stopped glipizide due to dizziness. She was on saxagliptin/metformin XR 09/998 mg (Kombyglyze) in the past. She was on JanuMet 50/1000 bid in 2015 >> lightheaded, HA, blurry vision.  She is checking sugars  times a day: - am:  134, 163, 222-303 >> 197-243 (chips at night) >> 73-263, 405 >> 95-184, 225 - 2h after b'fast: n/c >> 306, 377 >> 59, 77, 96, 122 >> 111-262, 382 >> 148-228, 244 - before lunch:  182-370 >> 90-172 >> 55, 104, 148 >> 99-321 >> 93-198 - 2h after lunch: 180 >> 159-224 >> 78, 100-179 >> 160-300  >>  84-182, 232 - before dinner: 759, 140-206 >> 100-145, 211, 224 (snack) >> n/c >> 80-109, 184 - 2h after dinner:  /c >> 181-336 >> 138 >> 160-190 >> 227, 259 >> 89-114 - bedtime:  n/c >> 132-196 >> n/c >> 216-381 >> 199  >> n/c >> 98 - nighttime: 109-188 >> n/c >> 125-170 Lowest: 69 >> 125 >> 154 >> 56 (oral surgery) >> 55 >> 73>> 80. Highest: 254 - [employer] >> 492 >> 331 >> 243 >> 500s>> 244.  Meter:  One Touch ultra 2  She saw nutrition in the past.  No CKD; latest BUN/creatinine: Lab Results  Component Value Date   BUN 6 11/09/2020   CREATININE 0.65 11/09/2020   No MAU: Lab Results  Component Value Date   MICRALBCREAT 0.8 11/09/2020   MICRALBCREAT 1.1 10/23/2019   MICRALBCREAT 8 05/27/2018   MICRALBCREAT 0.9 05/24/2017   MICRALBCREAT 0.8 04/06/2016   MICRALBCREAT 2.4 02/04/2015   MICRALBCREAT 0.4 10/30/2013   MICRALBCREAT 18.2 07/17/2013   MICRALBCREAT 1.9 10/06/2012   MICRALBCREAT 1.5 07/15/2012   + HL; lipids:  Lab Results  Component Value Date   CHOL 133 11/09/2020   HDL 39.00 (L) 11/09/2020   LDLCALC 77 11/09/2020   LDLDIRECT 179.6 07/17/2013   TRIG 81.0 11/09/2020   CHOLHDL 3 11/09/2020  On Zocor 20.  - Last eye exam 05/2021: No DR reportedly (Dr Macarthur Critchley -  [facility]).    -No numbness or tingling in her feet.  She is up-to-date with foot exams.  She also has a history of anxiety, asthma, GERD, constipation; polyarthropathy; seizure disorder.   Review of Systems + See HPI  I reviewed pt's medications, allergies, PMH, social hx, family hx, and changes were documented in the history of present illness. Otherwise, unchanged from my initial visit note.  Past Medical History:  Diagnosis Date   ANXIETY 03/17/2007   in past   ASTHMA 06/26/2007   DIAB W/O COMP TYPE II/UNS NOT STATED UNCNTRL 04/06/2009   GERD 06/26/2007   Irritable bowel syndrome 02/27/2010   constipation   OA (osteoarthritis) 12/13/2009   SEIZURE DISORDER 04/17/2010   in past  /over 4 years ago/no meds. actually vasovagal per neuro notes   Past Surgical History:  Procedure Laterality Date   ABDOMINAL HYSTERECTOMY     CESAREAN SECTION     TUBAL LIGATION     Social History   Socioeconomic History   Marital status: Divorced    Spouse name: Not on file   Number of children: Not on file   Years of education: Not on file   Highest education level: Not on file  Occupational History   Not on file  Tobacco Use   Smoking status: Never   Smokeless tobacco: Never  Substance and Sexual Activity   Alcohol use: No   Drug use: No   Sexual activity: Not on file  Other Topics Concern   Not on file  Social History Narrative   Divorced/Single. 2 chldren. 2 grandkids   Moved in with mother to help her      Works; Nurse, mental health for background checks [place]      Hobbies: church very active      Social Determinants of Radio broadcast assistant Strain: Not on [employer] Insecurity: Not on file  Transportation Needs: Not on file  Physical Activity: Not on file  Stress: Not on file  Social Connections: Not on file  Intimate Partner Violence: Not on file   Current Outpatient Medications on File Prior to Visit  Medication Sig Dispense Refill   albuterol (VENTOLIN HFA) 108 (90 Base) MCG/ACT inhaler Inhale 2 puffs into the lungs every 6 (six) hours as needed for wheezing or shortness of breath. 1 each 2   azelastine (ASTELIN) 0.1 % nasal spray Place 2 sprays into both nostrils 2 (two) times daily. 30 mL 12   gabapentin (NEURONTIN) 300 MG capsule Take 1 capsule (300 mg total) by mouth 3 (three) times daily as needed. 90 capsule 3   insulin glargine (LANTUS SOLOSTAR) 100 UNIT/ML Solostar Pen INJECT 40 UNITS INTO THE SKIN DAILY AT 10 PM. 45 mL 3   insulin lispro (HUMALOG KWIKPEN) 100 UNIT/ML KwikPen INJECT 10-16 units  INTO THE SKIN 3x a day before meals 30 mL 3   Insulin Pen Needle (BD PEN NEEDLE NANO 2ND GEN) 32G X 4 MM MISC USE ONCE DAILY  AS DIRECTED 100 each 0   metFORMIN (GLUCOPHAGE) 1000 MG tablet Take 1 tablet (1,000 mg total) by mouth 2 (two) times daily with a meal. 180 tablet 3   simvastatin (ZOCOR) 20 MG tablet TAKE 1 TABLET BY MOUTH EVERYDAY AT BEDTIME 90 tablet 3   TRULICITY 1.5 NU/2.7OZ SOPN INJECT 3 MG INTO THE SKIN ONCE A WEEK. 2 mL 1   zolpidem (AMBIEN) 10 MG tablet TAKE 1 TABLET BY MOUTH AT BEDTIME AS NEEDED  FOR SLEEP 30 tablet 5   No current facility-administered medications on file prior to visit.   Allergies  Allergen Reactions   Iodine     Per the patient. Had an oral solution in the 90's that gave her a bad reaction of hallucinations.    Family History  Problem Relation Age of Onset   Thyroid disease Mother    Hyperlipidemia Mother    Breast cancer Mother 62   Skin cancer Father        not melanoma- passed at 22   Colon cancer Maternal Uncle    Stomach cancer Maternal Uncle    Lung cancer Maternal Uncle    Cancer Maternal Grandfather        unknown    Objective:   Physical Exam  BP 118/78 (BP Location: Right Arm, Patient Position: Sitting, Cuff Size: Normal)    Pulse 91    Ht 5\' 5"  (1.651 m)    Wt 159 lb 3.2 oz (72.2 kg)    SpO2 96%    BMI 26.49 kg/m    Wt Readings from Last 3 Encounters:  07/14/21 159 lb 3.2 oz (72.2 kg)  05/02/21 166 lb 3.2 oz (75.4 kg)  03/10/21 168 lb 3.2 oz (76.3 kg)   Constitutional: overweight, in NAD Eyes: PERRLA, EOMI, no exophthalmos ENT: moist mucous membranes, no thyromegaly, no cervical lymphadenopathy Cardiovascular: RRR, No MRG Respiratory: CTA B Musculoskeletal: no deformities, strength intact in all 4 Skin: moist, warm, no rashes Neurological: no tremor with outstretched hands, DTR normal in all 4  Assessment:     1. DM2, insulin-dependent, uncontrolled, without long term complications, but with hyperglycemia  Type 1 diabetes investigation was negative: Component     Latest Ref Rng & Units 01/24/2018  ZNT8 Antibodies     U/mL <15  Islet Cell  Ab     Neg:<1:1 Negative  Glutamic Acid Decarb Ab     <5 IU/mL <5  C-Peptide     0.80 - 3.85 ng/mL 2.07  Glucose, Plasma     65 - 99 mg/dL 134 (H)   2. HL  3.  Overweight    Plan:     Pt with longstanding, uncontrolled, type 2 diabetes, on basal bolus insulin regimen and also metformin and weekly GLP-1 receptor agonist, which much worse control last year after starting steroid injections for frozen shoulders.  She was also not taking Humalog consistently, not checking blood sugars and she relaxed her diet.  HbA1c increased to 10.2%, but afterwards it started to improve.  At last visit this was 7.6%.  At that time, we discussed about using Humalog consistently, before meals, and we also discussed about starting Ozempic.  At that time, she was off Trulicity. -At today's visit, she mentions that sugars improved after starting Ozempic.  Reviewing her blood sugar logs, CBGs still appear to be quite variable, mostly higher in the morning and after breakfast, but also occasionally higher later in the day.  Upon questioning, she is not taking consistently the Humalog before breakfast.  I advised her to do so.  She also describes low blood sugars at night, however, she did not document these but usually wakes up and eats something sweet.  Some of the blood sugars in the morning may be high because of this.  I strongly advised her to document low blood sugars whenever they happen.  In fact, I highly suggested a CGM, but she would like to think about it.  Discussed about many advantages of the  device including alarms and eliminating the need to stick her fingers.  She does feel overwhelmed with the intensity of her diabetes regimen, but I did advise her that the CGM was actually make her diabetes care easier.  She will let me know if she decides to start this. - I advised her to: Patient Instructions  Please continue: - Metformin 2000 mg with dinner - Ozempic 1 mg weekly  Decrease: - Lantus 35 units  after dinner   Please take: - Humalog  8-12 units 15 minutes before b'fast 14-16 units 15 minutes before lunch 14-16 units 15 minutes before dinner  STOP eating after dinner.   Please return in 3-4 months with your sugar log.  - we checked her HbA1c: 7.0% (lower) - advised to check sugars at different times of the day - 4x a day, rotating check times - advised for yearly eye exams >> she is UTD - return to clinic in 3-4 months  2. HL  -Reviewed latest lipid panel from 10/2020: LDL slightly above target, triglycerides at goal, HDL slightly low: Lab Results  Component Value Date   CHOL 133 11/09/2020   HDL 39.00 (L) 11/09/2020   LDLCALC 77 11/09/2020   LDLDIRECT 179.6 07/17/2013   TRIG 81.0 11/09/2020   CHOLHDL 3 11/09/2020  -She continues on Zocor 20 mg daily without side effects  3.  Overweight -We will continue her GLP-1 receptor agonist which should also help with weight loss -We discussed about stopping chips and eating after dinner at previous visits -At this visit, she lost 9 pounds after starting Ozempic  Philemon Kingdom, MD PhD [facility]

## 2021-07-17 ENCOUNTER — Other Ambulatory Visit: Payer: Self-pay | Admitting: Internal Medicine

## 2021-09-19 ENCOUNTER — Other Ambulatory Visit: Payer: Self-pay | Admitting: Family Medicine

## 2021-10-18 ENCOUNTER — Other Ambulatory Visit: Payer: Self-pay | Admitting: Internal Medicine

## 2021-11-13 ENCOUNTER — Encounter: Payer: Self-pay | Admitting: Family Medicine

## 2021-11-13 ENCOUNTER — Other Ambulatory Visit: Payer: Self-pay | Admitting: Family Medicine

## 2021-11-13 ENCOUNTER — Ambulatory Visit (INDEPENDENT_AMBULATORY_CARE_PROVIDER_SITE_OTHER): Payer: Commercial Managed Care - PPO | Admitting: Family Medicine

## 2021-11-13 VITALS — BP 130/80 | HR 80 | Temp 97.7°F | Ht 65.0 in | Wt 162.6 lb

## 2021-11-13 DIAGNOSIS — Z23 Encounter for immunization: Secondary | ICD-10-CM | POA: Diagnosis not present

## 2021-11-13 DIAGNOSIS — L989 Disorder of the skin and subcutaneous tissue, unspecified: Secondary | ICD-10-CM

## 2021-11-13 DIAGNOSIS — E785 Hyperlipidemia, unspecified: Secondary | ICD-10-CM | POA: Diagnosis not present

## 2021-11-13 DIAGNOSIS — E119 Type 2 diabetes mellitus without complications: Secondary | ICD-10-CM | POA: Diagnosis not present

## 2021-11-13 DIAGNOSIS — Z0000 Encounter for general adult medical examination without abnormal findings: Secondary | ICD-10-CM | POA: Diagnosis not present

## 2021-11-13 DIAGNOSIS — J4540 Moderate persistent asthma, uncomplicated: Secondary | ICD-10-CM

## 2021-11-13 DIAGNOSIS — E1169 Type 2 diabetes mellitus with other specified complication: Secondary | ICD-10-CM | POA: Diagnosis not present

## 2021-11-13 LAB — COMPREHENSIVE METABOLIC PANEL
ALT: 18 U/L (ref 0–35)
AST: 21 U/L (ref 0–37)
Albumin: 4 g/dL (ref 3.5–5.2)
Alkaline Phosphatase: 79 U/L (ref 39–117)
BUN: 9 mg/dL (ref 6–23)
CO2: 24 mEq/L (ref 19–32)
Calcium: 9.5 mg/dL (ref 8.4–10.5)
Chloride: 103 mEq/L (ref 96–112)
Creatinine, Ser: 0.71 mg/dL (ref 0.40–1.20)
GFR: 93.04 mL/min (ref >60.00)
Glucose, Bld: 279 mg/dL — ABNORMAL HIGH (ref 70–99)
Potassium: 4 mEq/L (ref 3.5–5.1)
Sodium: 137 mEq/L (ref 135–145)
Total Bilirubin: 0.3 mg/dL (ref 0.2–1.2)
Total Protein: 6.9 g/dL (ref 6.0–8.3)

## 2021-11-13 LAB — CBC WITH DIFFERENTIAL/PLATELET
Basophils Absolute: 0 10*3/uL (ref 0.0–0.1)
Basophils Relative: 0.5 % (ref 0.0–3.0)
Eosinophils Absolute: 0.2 10*3/uL (ref 0.0–0.7)
Eosinophils Relative: 2 % (ref 0.0–5.0)
HCT: 36.9 % (ref 36.0–46.0)
Hemoglobin: 12.1 g/dL (ref 12.0–15.0)
Lymphocytes Relative: 45.4 % (ref 12.0–46.0)
Lymphs Abs: 3.5 10*3/uL (ref 0.7–4.0)
MCHC: 32.7 g/dL (ref 30.0–36.0)
MCV: 89.6 fl (ref 78.0–100.0)
Monocytes Absolute: 0.5 10*3/uL (ref 0.1–1.0)
Monocytes Relative: 7.1 % (ref 3.0–12.0)
Neutro Abs: 3.4 10*3/uL (ref 1.4–7.7)
Neutrophils Relative %: 45 % (ref 43.0–77.0)
Platelets: 272 10*3/uL (ref 150.0–400.0)
RBC: 4.12 Mil/uL (ref 3.87–5.11)
RDW: 13.1 % (ref 11.5–15.5)
WBC: 7.6 10*3/uL (ref 4.0–10.5)

## 2021-11-13 LAB — MICROALBUMIN / CREATININE URINE RATIO
Creatinine,U: 125.4 mg/dL
Microalb Creat Ratio: 1.1 mg/g (ref 0.0–30.0)
Microalb, Ur: 1.4 mg/dL (ref 0.0–1.9)

## 2021-11-13 LAB — LIPID PANEL
Cholesterol: 148 mg/dL (ref 0–200)
HDL: 36.2 mg/dL — ABNORMAL LOW (ref >39.00)
NonHDL: 111.5
Total CHOL/HDL Ratio: 4
Triglycerides: 226 mg/dL — ABNORMAL HIGH (ref 0.0–149.0)
VLDL: 45.2 mg/dL — ABNORMAL HIGH (ref 0.0–40.0)

## 2021-11-13 LAB — HM DIABETES FOOT EXAM

## 2021-11-13 LAB — LDL CHOLESTEROL, DIRECT: Direct LDL: 93 mg/dL

## 2021-11-13 LAB — HEMOGLOBIN A1C: Hgb A1c MFr Bld: 7.5 % — ABNORMAL HIGH (ref 4.6–6.5)

## 2021-11-13 MED ORDER — ZOLPIDEM TARTRATE 10 MG PO TABS: ORAL_TABLET | ORAL | 5 refills | Status: DC

## 2021-11-13 MED ORDER — BUDESONIDE-FORMOTEROL FUMARATE 160-4.5 MCG/ACT IN AERO: 2.0000 | INHALATION_SPRAY | Freq: Two times a day (BID) | RESPIRATORY_TRACT | 11 refills | Status: AC

## 2021-12-01 ENCOUNTER — Ambulatory Visit (INDEPENDENT_AMBULATORY_CARE_PROVIDER_SITE_OTHER): Payer: Commercial Managed Care - PPO | Admitting: Family Medicine

## 2021-12-01 ENCOUNTER — Encounter: Payer: Self-pay | Admitting: Family Medicine

## 2021-12-01 VITALS — BP 138/82 | HR 77 | Temp 97.6°F | Ht 65.0 in | Wt 160.4 lb

## 2021-12-01 DIAGNOSIS — R051 Acute cough: Secondary | ICD-10-CM | POA: Diagnosis not present

## 2021-12-01 DIAGNOSIS — E119 Type 2 diabetes mellitus without complications: Secondary | ICD-10-CM | POA: Diagnosis not present

## 2021-12-01 DIAGNOSIS — J208 Acute bronchitis due to other specified organisms: Secondary | ICD-10-CM | POA: Diagnosis not present

## 2021-12-01 DIAGNOSIS — B9689 Other specified bacterial agents as the cause of diseases classified elsewhere: Secondary | ICD-10-CM

## 2021-12-01 DIAGNOSIS — J4541 Moderate persistent asthma with (acute) exacerbation: Secondary | ICD-10-CM

## 2021-12-01 LAB — POC COVID19 BINAXNOW: SARS Coronavirus 2 Ag: NEGATIVE

## 2021-12-01 MED ORDER — AZITHROMYCIN 250 MG PO TABS: ORAL_TABLET | ORAL | 0 refills | Status: DC

## 2021-12-01 MED ORDER — PREDNISONE 10 MG PO TABS: ORAL_TABLET | ORAL | 0 refills | Status: DC

## 2021-12-01 MED ORDER — GUAIFENESIN-CODEINE 100-10 MG/5ML PO SOLN: 5.0000 mL | Freq: Four times a day (QID) | ORAL | 0 refills | Status: DC | PRN

## 2021-12-01 NOTE — Patient Instructions (Signed)
Please follow up if symptoms do not improve or as needed.    Start your inhaler every 4-6 hours to help with the tightness in your chest. Take the prednisone and antibiotics as prescribed. You may use the cough medications at night.  Monitor your blood sugars as they may run high with the infection and the prednisone.   Acute Bronchitis, Adult  Acute bronchitis is sudden inflammation of the main airways (bronchi) that come off the windpipe (trachea) in the lungs. The swelling causes the airways to get smaller and make more mucus than normal. This can make it hard to breathe and can cause coughing or noisy breathing (wheezing). Acute bronchitis may last several weeks. The cough may last longer. Allergies, asthma, and exposure to smoke may make the condition worse. What are the causes? This condition can be caused by germs and by substances that irritate the lungs, including: Cold and flu viruses. The most common cause of this condition is the virus that causes the common cold. Bacteria. This is less common. Breathing in substances that irritate the lungs, including: Smoke from cigarettes and other forms of tobacco. Dust and pollen. Fumes from household cleaning products, gases, or burned fuel. Indoor or outdoor air pollution. What increases the risk? The following factors may make you more likely to develop this condition: A weak body's defense system, also called the immune system. A condition that affects your lungs and breathing, such as asthma. What are the signs or symptoms? Common symptoms of this condition include: Coughing. This may bring up clear, yellow, or green mucus from your lungs (sputum). Wheezing. Runny or stuffy nose. Having too much mucus in your lungs (chest congestion). Shortness of breath. Aches and pains, including sore throat or chest. How is this diagnosed? This condition is usually diagnosed based on: Your symptoms and medical history. A physical exam. You  may also have other tests, including tests to rule out other conditions, such as pneumonia. These tests include: A test of lung function. Test of a mucus sample to look for the presence of bacteria. Tests to check the oxygen level in your blood. Blood tests. Chest X-ray. How is this treated? Most cases of acute bronchitis clear up over time without treatment. Your health care provider may recommend: Drinking more fluids to help thin your mucus so it is easier to cough up. Taking inhaled medicine (inhaler) to improve air flow in and out of your lungs. Using a vaporizer or a humidifier. These are machines that add water to the air to help you breathe better. Taking a medicine that thins mucus and clears congestion (expectorant). Taking a medicine that prevents or stops coughing (cough suppressant). It is notcommon to take an antibiotic medicine for this condition. Follow these instructions at home:  Take over-the-counter and prescription medicines only as told by your health care provider. Use an inhaler, vaporizer, or humidifier as told by your health care provider. Take two teaspoons (10 mL) of honey at bedtime to lessen coughing at night. Drink enough fluid to keep your urine pale yellow. Do not use any products that contain nicotine or tobacco. These products include cigarettes, chewing tobacco, and vaping devices, such as e-cigarettes. If you need help quitting, ask your health care provider. Get plenty of rest. Return to your normal activities as told by your health care provider. Ask your health care provider what activities are safe for you. Keep all follow-up visits. This is important. How is this prevented? To lower your risk of getting  this condition again: Wash your hands often with soap and water for at least 20 seconds. If soap and water are not available, use hand sanitizer. Avoid contact with people who have cold symptoms. Try not to touch your mouth, nose, or eyes with your  hands. Avoid breathing in smoke or chemical fumes. Breathing smoke or chemical fumes will make your condition worse. Get the flu shot every year. Contact a health care provider if: Your symptoms do not improve after 2 weeks. You have trouble coughing up the mucus. Your cough keeps you awake at night. You have a fever. Get help right away if you: Cough up blood. Feel pain in your chest. Have severe shortness of breath. Faint or keep feeling like you are going to faint. Have a severe headache. Have a fever or chills that get worse. These symptoms may represent a serious problem that is an emergency. Do not wait to see if the symptoms will go away. Get medical help right away. Call your local emergency services (911 in the [place] Do not drive yourself to the hospital. Summary Acute bronchitis is inflammation of the main airways (bronchi) that come off the windpipe (trachea) in the lungs. The swelling causes the airways to get smaller and make more mucus than normal. Drinking more fluids can help thin your mucus so it is easier to cough up. Take over-the-counter and prescription medicines only as told by your health care provider. Do not use any products that contain nicotine or tobacco. These products include cigarettes, chewing tobacco, and vaping devices, such as e-cigarettes. If you need help quitting, ask your health care provider. Contact a health care provider if your symptoms do not improve after 2 weeks. This information is not intended to replace advice given to you by your health care provider. Make sure you discuss any questions you have with your health care provider. Document Revised: 09/07/2020 Document Reviewed: 09/07/2020 Elsevier Patient Education  [place].

## 2021-12-01 NOTE — Progress Notes (Signed)
Subjective  CC:  Chief Complaint  Patient presents with   Cough    Pt stated that she has been coughing for the past week and has gotten worse since 11/27/2021 and now it is all in her chest   Same day acute visit; PCP not available. New pt to me. Chart reviewed.   HPI: SUBJECTIVE:  Denise Beard is a 61 y.o. female who complains of congestion, nasal blockage, post nasal drip, cough described as productive and denies sinus, high fevers, significant GI symptoms. She has asthma and c/o wheezing and sob. Used albuterol a few days ago with some mild relief but hasn't used it since. Was to restart symbicort last month but did not start it due to cost. Symptoms have been present for 4 days. She denies a history of anorexia, dizziness, vomiting Patient does not smoke cigarettes. Pt has diabetes that is controlled on multiple meds. She denies hyperglycemia now. Sob has been mildly limiting. No sick contacts. She did not self test for covid.   Assessment  1. Acute bacterial bronchitis   2. Acute cough   3. Moderate persistent asthma with acute exacerbation   4. Type II diabetes mellitus, well controlled ([place])      Plan  Discussion:  Treat for bacterial bronchitis due to symptoms course and worsening symptoms. Education regarding differences between viral and bacterial infections and treatment options are discussed.  Supportive care measures are recommended.  We discussed the use of mucolytic's, decongestants, antihistamines and antitussives as needed.  Tylenol or Advil are recommended if needed. Zpak and rob AC.   Asthma exacerbation with only fair air mvt. Recommend starting albuterol q 4-6 hours until improved. Pred 7 day taper. Return if not improving in 48 hours. Can f/u with PCP to discuss other maintenance medications.  Diabetes: education on hyperglycemia with infection and prednisone.    Follow up: as needed   Orders Placed This Encounter  Procedures   POC COVID-19   Meds ordered  this encounter  Medications   predniSONE (DELTASONE) 10 MG tablet    Sig: Take 4 tabs qd x 2 days, 3 qd x 2 days, 2 qd x 2d, 1qd x 3 days    Dispense:  21 tablet    Refill:  0   azithromycin (ZITHROMAX) 250 MG tablet    Sig: Take 2 tabs today, then 1 tab daily for 4 days    Dispense:  1 each    Refill:  0   guaiFENesin-codeine 100-10 MG/5ML syrup    Sig: Take 5 mLs by mouth every 6 (six) hours as needed for cough.    Dispense:  120 mL    Refill:  0      I reviewed the patients updated PMH, FH, and SocHx.  Social History: Patient  reports that she has never smoked. She has never used smokeless tobacco. She reports that she does not drink alcohol and does not use drugs.  Patient Active Problem List   Diagnosis Date Noted   COVID-19 05/04/2021   Viral upper respiratory tract infection 05/02/2021   Osteoarthritis 02/04/2015   Vasovagal syncope 02/04/2015   Hyperlipidemia associated with type 2 diabetes mellitus ([place]) 02/04/2015   Allergic rhinitis 02/04/2015   Insomnia 02/04/2015   Irritable bowel syndrome 02/27/2010   Type II diabetes mellitus, well controlled ([place]) 04/06/2009   Asthma 06/26/2007   GERD 06/26/2007    Review of Systems: Cardiovascular: negative for chest pain Respiratory: negative for SOB or hemoptysis Gastrointestinal: negative for  abdominal pain Genitourinary: negative for dysuria or gross hematuria Current Meds  Medication Sig   albuterol (VENTOLIN HFA) 108 (90 Base) MCG/ACT inhaler Inhale 2 puffs into the lungs every 6 (six) hours as needed for wheezing or shortness of breath.   azelastine (ASTELIN) 0.1 % nasal spray Place 2 sprays into both nostrils 2 (two) times daily.   azithromycin (ZITHROMAX) 250 MG tablet Take 2 tabs today, then 1 tab daily for 4 days   budesonide-formoterol (SYMBICORT) 160-4.5 MCG/ACT inhaler Inhale 2 puffs into the lungs 2 (two) times daily.   guaiFENesin-codeine 100-10 MG/5ML syrup Take 5 mLs by mouth every 6 (six) hours as  needed for cough.   insulin glargine (LANTUS SOLOSTAR) 100 UNIT/ML Solostar Pen INJECT 35 UNITS INTO THE SKIN DAILY AT 10 PM.   insulin lispro (HUMALOG KWIKPEN) 100 UNIT/ML KwikPen INJECT 8-14 UNITS INTO THE SKIN 3 TIMES A DAY BEFORE MEALS   Insulin Pen Needle (BD PEN NEEDLE NANO 2ND GEN) 32G X 4 MM MISC USE ONCE DAILY AS DIRECTED   metFORMIN (GLUCOPHAGE) 1000 MG tablet Take 2 tablets (2,000 mg total) by mouth daily with supper.   OZEMPIC, 1 MG/DOSE, 4 MG/3ML SOPN Inject 1 mg into the skin once a week.   predniSONE (DELTASONE) 10 MG tablet Take 4 tabs qd x 2 days, 3 qd x 2 days, 2 qd x 2d, 1qd x 3 days   simvastatin (ZOCOR) 20 MG tablet TAKE 1 TABLET BY MOUTH EVERYDAY AT BEDTIME   zolpidem (AMBIEN) 10 MG tablet TAKE 1 TABLET BY MOUTH AT BEDTIME AS NEEDED FOR SLEEP    Objective  Vitals: BP 138/82   Pulse 77   Temp 97.6 F (36.4 C)   Ht '5\' 5"'$  (1.651 m)   Wt 160 lb 6.4 oz (72.8 kg)   SpO2 98%   BMI 26.69 kg/m  General: no acute distress but coughing Psych:  Alert and oriented, normal mood and affect HEENT:  Normocephalic, atraumatic, supple neck, moist mucous membranes, supple neck Cardiovascular:  RRR without murmur. no edema Respiratory:  tight with clear breath sounds bilaterally, no wheezing or rales Skin:  Warm, no rashes Neurologic:   Mental status is normal. normal gait  Office Visit on 12/01/2021  Component Date Value Ref Range Status   SARS Coronavirus 2 Ag 12/01/2021 Negative  Negative Final    Commons side effects, risks, benefits, and alternatives for medications and treatment plan prescribed today were discussed, and the patient expressed understanding of the given instructions. Patient is instructed to call or message via MyChart if he/she has any questions or concerns regarding our treatment plan. No barriers to understanding were identified. We discussed Red Flag symptoms and signs in detail. Patient expressed understanding regarding what to do in case of urgent or  emergency type symptoms.  Medication list was reconciled, printed and provided to the patient in AVS. Patient instructions and summary information was reviewed with the patient as documented in the AVS. This note was prepared with assistance of Dragon voice recognition software. Occasional wrong-word or sound-a-like substitutions may have occurred due to the inherent limitations of voice recognition software

## 2021-12-08 ENCOUNTER — Ambulatory Visit: Payer: Commercial Managed Care - PPO | Admitting: Internal Medicine

## 2021-12-08 ENCOUNTER — Telehealth: Payer: Self-pay | Admitting: Family Medicine

## 2021-12-08 NOTE — Telephone Encounter (Signed)
See below

## 2021-12-08 NOTE — Telephone Encounter (Signed)
Triage determined patient would need to see PCP within 24 hours. Following discussion with PCP, patient was recommended to visit UC/ED if worsening symptoms. Patient states ED out of pocket cost is $700, but she is going to visit the UC if symptoms worsen.

## 2021-12-08 NOTE — Telephone Encounter (Signed)
Patient states she is still experiencing fatigue,shortness of breath, pressure in chest, and rattling noise following visit with Dr. Jonni Sanger on 07/14. States she has continued medications prescribed, but they are not improving symptoms overall. Patient requests recommendations of what the next steps would be.   There are no open OV slots at [facility]. Will patient need to go to ED or can she see another provider at different office? Please Advise.

## 2021-12-08 NOTE — Telephone Encounter (Signed)
Chest pressure portion is worrisome- please triage- she may need ED -I would be willing to work her in this afternoon if needed

## 2021-12-08 NOTE — Telephone Encounter (Signed)
Patient has been sent to triage. Awaiting note.

## 2021-12-11 ENCOUNTER — Ambulatory Visit: Payer: Commercial Managed Care - PPO | Admitting: Family

## 2021-12-11 ENCOUNTER — Encounter: Payer: Self-pay | Admitting: Family

## 2021-12-11 VITALS — BP 125/77 | HR 72 | Temp 97.7°F | Ht 65.0 in | Wt 163.0 lb

## 2021-12-11 DIAGNOSIS — D225 Melanocytic nevi of trunk: Secondary | ICD-10-CM | POA: Insufficient documentation

## 2021-12-11 DIAGNOSIS — J4541 Moderate persistent asthma with (acute) exacerbation: Secondary | ICD-10-CM

## 2021-12-11 NOTE — Patient Instructions (Signed)
It was very nice to see you today!   Restart your Symbicort inhaler - 2 puffs in am and 2 puffs in the evening. Rinse your mouth with water after using.  Use your rescue (Albuterol) inhaler in the morning when you wake up, then start the Symbicort 1-2 hours later. And use this again at bedtime to open your lungs up over night.  If you are not ANY better by Friday morning, send a message or call and I will send an order for a chest xray.    PLEASE NOTE:  If you had any lab tests please let us know if you have not heard back within a few days. You may see your results on MyChart before we have a chance to review them but we will give you a call once they are reviewed by [place]. If we ordered any referrals today, please let us know if you have not heard from their office within the next week.

## 2021-12-11 NOTE — Telephone Encounter (Signed)
FYI- I offered patient a last minute opening with Dr. Yong Channel at 1pm on 12/11/21 but patient declined. States she wanted to get in as soon as possible.  Patient was able to be scheduled  at 9:20 am with Jeanie Sewer on 07/24.

## 2021-12-11 NOTE — Progress Notes (Signed)
Patient ID: Deatra Ina, female    DOB: 1960/12/01, 61 y.o.   MRN: [id number]  Chief Complaint  Patient presents with   Cough    Pt had an appointment with Dr. Jonni Sanger on 7/14 for a cough. Pt states sometimes it is dry, Has tried cough medications she was given for bronchitis. Pt is currently taking cough syrup. Pt states her cough is worsening, chest pressure and congestion and wheezing.     HPI: Persistent cough:  SHEILAH RAYOS is a 61 y.o. female who is here for continued complaints of chest congestion, cough described as non-productive and denies sinus, high fevers, significant GI symptoms. She has hx of asthma and c/o wheezing and sob. Used albuterol a few days ago with some mild relief but hasn't used it since. Was to restart symbicort last month but did not start it due to cost.  She denies a history of anorexia, dizziness, vomiting. Patient does not smoke cigarettes. Pt has diabetes that is controlled on multiple meds. She denies hyperglycemia now.    Assessment & Plan:  1. Moderate persistent asthma with acute exacerbation pt not using rescue inhaler. No sinus sx, continued dry cough with wheezing, SOB, and night time awakenings d/t cough. Advised d/t hx of Asthma she needs to be on maintenance inhaler. She reports she has Symbicort inhaler at home, but her copay is $40 and she can't afford this. Advised to use 2 puffs bid for next 5-7 days, then 1 puff bid and this should last her 2 months. Pt said her inhaler may be old, advised if in last 2 years it is still usable, she will call the office if it is older.     Subjective:    Outpatient Medications Prior to Visit  Medication Sig Dispense Refill   albuterol (VENTOLIN HFA) 108 (90 Base) MCG/ACT inhaler Inhale 2 puffs into the lungs every 6 (six) hours as needed for wheezing or shortness of breath. 1 each 2   azelastine (ASTELIN) 0.1 % nasal spray Place 2 sprays into both nostrils 2 (two) times daily. (Patient taking differently:  Place 2 sprays into both nostrils as needed.) 30 mL 12   guaiFENesin-codeine 100-10 MG/5ML syrup Take 5 mLs by mouth every 6 (six) hours as needed for cough. 120 mL 0   insulin glargine (LANTUS SOLOSTAR) 100 UNIT/ML Solostar Pen INJECT 35 UNITS INTO THE SKIN DAILY AT 10 PM. 45 mL 3   insulin lispro (HUMALOG KWIKPEN) 100 UNIT/ML KwikPen INJECT 8-14 UNITS INTO THE SKIN 3 TIMES A DAY BEFORE MEALS 30 mL 1   Insulin Pen Needle (BD PEN NEEDLE NANO 2ND GEN) 32G X 4 MM MISC USE ONCE DAILY AS DIRECTED 100 each 0   metFORMIN (GLUCOPHAGE) 1000 MG tablet Take 2 tablets (2,000 mg total) by mouth daily with supper. 180 tablet 3   OZEMPIC, 1 MG/DOSE, 4 MG/3ML SOPN Inject 1 mg into the skin once a week.     simvastatin (ZOCOR) 20 MG tablet TAKE 1 TABLET BY MOUTH EVERYDAY AT BEDTIME 90 tablet 3   zolpidem (AMBIEN) 10 MG tablet TAKE 1 TABLET BY MOUTH AT BEDTIME AS NEEDED FOR SLEEP 30 tablet 5   azithromycin (ZITHROMAX) 250 MG tablet Take 2 tabs today, then 1 tab daily for 4 days 1 each 0   predniSONE (DELTASONE) 10 MG tablet Take 4 tabs qd x 2 days, 3 qd x 2 days, 2 qd x 2d, 1qd x 3 days 21 tablet 0   budesonide-formoterol (SYMBICORT) 160-4.5  MCG/ACT inhaler Inhale 2 puffs into the lungs 2 (two) times daily. (Patient not taking: Reported on 12/11/2021) 1 each 11   No facility-administered medications prior to visit.   Past Medical History:  Diagnosis Date   ANXIETY 03/17/2007   in past   ASTHMA 06/26/2007   DIAB W/O COMP TYPE II/UNS NOT STATED UNCNTRL 04/06/2009   GERD 06/26/2007   Irritable bowel syndrome 02/27/2010   constipation   OA (osteoarthritis) 12/13/2009   SEIZURE DISORDER 04/17/2010   in past /over 4 years ago/no meds. actually vasovagal per neuro notes   Past Surgical History:  Procedure Laterality Date   ABDOMINAL HYSTERECTOMY     CESAREAN SECTION     TUBAL LIGATION     Allergies  Allergen Reactions   Iodine     Per the patient. Had an oral solution in the 90's that gave her a bad reaction  of hallucinations.       Objective:    Physical Exam Vitals and nursing note reviewed.  Constitutional:      Appearance: Normal appearance.  Cardiovascular:     Rate and Rhythm: Normal rate and regular rhythm.  Pulmonary:     Effort: Pulmonary effort is normal.     Breath sounds: Normal breath sounds.  Musculoskeletal:        General: Normal range of motion.  Skin:    General: Skin is warm and dry.  Neurological:     Mental Status: She is alert.  Psychiatric:        Mood and Affect: Mood normal.        Behavior: Behavior normal.    BP 125/77 (BP Location: Left Arm, Patient Position: Sitting, Cuff Size: Large)   Pulse 72   Temp 97.7 F (36.5 C) (Temporal)   Ht '5\' 5"'$  (1.651 m)   Wt 163 lb (73.9 kg)   SpO2 100%   BMI 27.12 kg/m  Wt Readings from Last 3 Encounters:  12/11/21 163 lb (73.9 kg)  12/01/21 160 lb 6.4 oz (72.8 kg)  11/13/21 162 lb 9.6 oz (73.8 kg)      Jeanie Sewer, NP

## 2021-12-11 NOTE — Telephone Encounter (Signed)
Patient Name: Denise Beard Gender: Female DOB: January 31, 1961 Age: 61 Y 30 M 11 D Return Phone Number: [phone] (Primary) Address: City/ State/ Zip: [place]  [address] Client [place] [facility] [place] Client Site [place] [facility] [place] [facility] Provider [name]- MD Contact Type Call Who Is Calling Patient / Member / Family / Caregiver Call Type Triage / Clinical Relationship To Patient Self Return Phone Number [phone] (Primary) Chief Complaint CHEST PAIN - pain, pressure, heaviness or tightness Reason for Call Symptomatic / Request for [place] states pt was seen on Friday (12/01/21) and dx with acute bronchitis. She is still experiencing fatigue, chest pressure, shortness of breath, and rattling noise. Translation No Nurse Assessment Nurse: Roselyn Reef, RN, [place] Date/Time (Eastern Time): 12/08/2021 4:08:53 PM Confirm and document reason for call. If symptomatic, describe symptoms. ---Caller states she was seen last week and diagnosed with bronchitis. Completed abx yesterday and symptoms persist. Still taking the albuterol and cough medication. Completes prednisone tomorrow. States she has some mild shortness of breath and chest pressure. Does the patient have any new or worsening symptoms? ---Yes Will a triage be completed? ---Yes Related visit to physician within the last 2 weeks? ---Yes Does the PT have any chronic conditions? (i.e. diabetes, asthma, this includes High risk factors for pregnancy, etc.) ---Yes List chronic conditions. ---Dm type 2, asthma Is this a behavioral health or substance abuse call? ---No Guidelines Guideline Title Affirmed Question Affirmed Notes Nurse Date/Time (Eastern Time) Infection on Antibiotic Follow-up Call [1] Taking antibiotic > 72 hours (3 days) AND [2] symptoms Rennie Natter, [facility] 12/08/2021 4:13:15 PM PLEASE NOTE: All timestamps contained  within this report are represented as [place] Standard Time. CONFIDENTIALTY NOTICE: This fax transmission is intended only for the addressee. It contains information that is legally privileged, confidential or otherwise protected from use or disclosure. If you are not the intended recipient, you are strictly prohibited from reviewing, disclosing, copying using or disseminating any of this information or taking any action in reliance on or regarding this information. If you have received this fax in error, please notify us immediately by telephone so that we can arrange for its return to [place]. Phone: [phone], Toll-Free: [phone], Fax: [phone] Page: 2 of 2 Call Id: [id number] Guidelines Guideline Title Affirmed Question Affirmed Notes Nurse Date/Time Eilene Ghazi Time) (other than fever) not improved Disp. Time Eilene Ghazi Time) Disposition Final User 12/08/2021 4:07:07 PM Send to Urgent [place] 12/08/2021 4:17:23 PM Call PCP within 24 Hours Yes Roselyn Reef [place], [facility] Final Disposition 12/08/2021 4:17:23 PM Call PCP within 24 Hours Yes Roselyn Reef, RN, [place] Disagree/Comply Comply Caller Understands Yes PreDisposition Go to Urgent Care/Walk-In Clinic Care Advice Given Per Guideline CALL PCP WITHIN 24 HOURS: CALL BACK IF: * You become worse CARE ADVICE given per Infection on Antibiotics Follow-Up Call (Adult) guideline. Comments User: Cristi Loron, RN Date/Time Eilene Ghazi Time): 12/08/2021 4:20:20 PM caller states she spoke with office before speaking with me and was told to get triager's advice. Informed patient that if she is not seeing any improvement with symptoms she should go to UC if doctor cannot see her.

## 2022-01-30 ENCOUNTER — Ambulatory Visit (INDEPENDENT_AMBULATORY_CARE_PROVIDER_SITE_OTHER): Payer: Commercial Managed Care - PPO | Admitting: *Deleted

## 2022-01-30 DIAGNOSIS — Z23 Encounter for immunization: Secondary | ICD-10-CM | POA: Diagnosis not present

## 2022-01-30 NOTE — Progress Notes (Signed)
Pt here for  Shingrix and flu vaccines,allregies reviewed. Vaccines given, tolerated well.

## 2022-03-09 ENCOUNTER — Ambulatory Visit: Payer: Commercial Managed Care - PPO | Admitting: Internal Medicine

## 2022-03-09 ENCOUNTER — Encounter: Payer: Self-pay | Admitting: Internal Medicine

## 2022-03-09 VITALS — BP 120/76 | HR 81 | Ht 65.0 in | Wt 164.6 lb

## 2022-03-09 DIAGNOSIS — E119 Type 2 diabetes mellitus without complications: Secondary | ICD-10-CM | POA: Diagnosis not present

## 2022-03-09 DIAGNOSIS — E663 Overweight: Secondary | ICD-10-CM

## 2022-03-09 DIAGNOSIS — E785 Hyperlipidemia, unspecified: Secondary | ICD-10-CM | POA: Diagnosis not present

## 2022-03-09 LAB — POCT GLYCOSYLATED HEMOGLOBIN (HGB A1C): Hemoglobin A1C: 7.7 % — AB (ref 4.0–5.6)

## 2022-03-09 MED ORDER — LANTUS SOLOSTAR 100 UNIT/ML ~~LOC~~ SOPN: PEN_INJECTOR | SUBCUTANEOUS | 1 refills | Status: DC

## 2022-03-09 MED ORDER — METFORMIN HCL 1000 MG PO TABS: 2000.0000 mg | ORAL_TABLET | Freq: Every day | ORAL | 3 refills | Status: DC

## 2022-03-09 MED ORDER — INSULIN LISPRO (1 UNIT DIAL) 100 UNIT/ML (KWIKPEN): PEN_INJECTOR | SUBCUTANEOUS | 1 refills | Status: DC

## 2022-03-09 NOTE — Patient Instructions (Addendum)
Please continue: - Metformin 2000 mg with dinner - Ozempic 1 mg weekly  Change: - Lantus 35 units after dinner  - Humalog  8-12 units 15 minutes before b'fast 8-12 units 15 minutes before lunch 8-12 units 15 minutes before dinner  STOP eating after dinner.   Please return in 3-4 months with your sugar log.

## 2022-03-09 NOTE — Progress Notes (Signed)
Subjective:     Patient ID: Denise Beard, female   DOB: [birth year], 61 y.o.   MRN: [id number]  Diabetes  Ms. Radich is a pleasant 61 y.o. woman, returning for f/u for DM2, dx 2012, insulin-dependent, uncontrolled, without long-term complications. Last visit 8 months ago.  Interim history: No increased urination, nausea, chest pain.   She has constipation from [place]. She has not tried any medication for this.  Reviewed HbA1c levels: Lab Results  Component Value Date   HGBA1C 7.5 (H) 11/13/2021   HGBA1C 7.0 (A) 07/14/2021   HGBA1C 7.6 (A) 03/10/2021   She is on: - Metformin 1000 mg 2x a day with meals >> 2000 mg with dinner - Trulicity 1.5 >> 3 mg weekly  >> Ozempic 1 mg weekly - Lantus 30 >> 40 >> 35 >> 40 units after dinner  - Humalog 10  units 15 minutes before dinner >> ... mostly taking this with L and D: 14 >> 8-12 units 15 minutes before b'fast 14-16 units 15 minutes before lunch 14-16 units 15 minutes before dinner We stopped glipizide due to dizziness. She was on saxagliptin/metformin XR 09/998 mg (Kombyglyze) in the past. She was on JanuMet 50/1000 bid in 2015 >> lightheaded, HA, blurry vision.  She is checking sugars 1-2 times a day: - am:  197-243 >> 73-263, 405 >> 95-184, 225 >> 89-142, 272 - 2h after b'fast:   111-262, 382 >> 148-228, 244 >> n/c - before lunch:  90-172 >> 55, 104, 148 >> 99-321 >> 93-198 >> 63 - 2h after lunch:  78, 100-179 >> 160-300  >> 84-182, 232 >> 77 - before dinner: 100-145, 211, 224 >> n/c >> 80-109, 184 >> 103 - 2h after dinner:138 >> 160-190 >> 227, 259 >> 89-114 >> n/c - bedtime:  n/c >> 216-381 >> 199  >> n/c >> 98 >> n/c - nighttime: 109-188 >> n/c >> 125-170 >> n/c Lowest: 55 >> 73 >> 80 >> 63 Highest: 243 >> 500s >> 244 >> 272  Meter:  One Touch ultra 2  She saw nutrition in the past.  No CKD; latest BUN/creatinine: Lab Results  Component Value Date   BUN 9 11/13/2021   CREATININE 0.71 11/13/2021   No MAU: Lab Results   Component Value Date   MICRALBCREAT 1.1 11/13/2021   MICRALBCREAT 0.8 11/09/2020   MICRALBCREAT 1.1 10/23/2019   MICRALBCREAT 8 05/27/2018   MICRALBCREAT 0.9 05/24/2017   MICRALBCREAT 0.8 04/06/2016   MICRALBCREAT 2.4 02/04/2015   MICRALBCREAT 0.4 10/30/2013   MICRALBCREAT 18.2 07/17/2013   MICRALBCREAT 1.9 10/06/2012   + HL; lipids:  Lab Results  Component Value Date   CHOL 148 11/13/2021   HDL 36.20 (L) 11/13/2021   LDLCALC 77 11/09/2020   LDLDIRECT 93.0 11/13/2021   TRIG 226.0 (H) 11/13/2021   CHOLHDL 4 11/13/2021  On Zocor 20.  - Last eye exam 05/2021: No DR reportedly (Dr Macarthur Critchley - [facility]).    - No numbness or tingling in her feet.  Last foot exam: Dr. Yong Channel 11/13/2021.  She also has a history of anxiety, asthma, GERD, constipation; polyarthropathy; seizure disorder.   Review of Systems + See HPI  I reviewed pt's medications, allergies, PMH, social hx, family hx, and changes were documented in the history of present illness. Otherwise, unchanged from my initial visit note.  Past Medical History:  Diagnosis Date   ANXIETY 03/17/2007   in past   ASTHMA 06/26/2007   DIAB W/O COMP TYPE II/UNS NOT  STATED UNCNTRL 04/06/2009   GERD 06/26/2007   Irritable bowel syndrome 02/27/2010   constipation   OA (osteoarthritis) 12/13/2009   SEIZURE DISORDER 04/17/2010   in past /over 4 years ago/no meds. actually vasovagal per neuro notes   Past Surgical History:  Procedure Laterality Date   ABDOMINAL HYSTERECTOMY     CESAREAN SECTION     TUBAL LIGATION     Social History   Socioeconomic History   Marital status: Divorced    Spouse name: Not on file   Number of children: Not on file   Years of education: Not on file   Highest education level: Not on file  Occupational History   Not on file  Tobacco Use   Smoking status: Never   Smokeless tobacco: Never  Substance and Sexual Activity   Alcohol use: No   Drug use: No   Sexual activity: Not on file   Other Topics Concern   Not on file  Social History Narrative   Divorced/Single. 2 chldren. 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 1 each 2   azelastine (ASTELIN) 0.1 % nasal spray Place 2 sprays into both nostrils 2 (two) times daily. (Patient taking differently: Place 2 sprays into both nostrils as needed.) 30 mL 12   budesonide-formoterol (SYMBICORT) 160-4.5 MCG/ACT inhaler Inhale 2 puffs into the lungs 2 (two) times daily. (Patient not taking: Reported on 12/11/2021) 1 each 11   guaiFENesin-codeine 100-10 MG/5ML syrup Take 5 mLs by mouth every 6 (six) hours as needed for cough. 120 mL 0   insulin glargine (LANTUS SOLOSTAR) 100 UNIT/ML Solostar Pen INJECT 35 UNITS INTO THE SKIN DAILY AT 10 PM. 45 mL 3   insulin lispro (HUMALOG KWIKPEN) 100 UNIT/ML KwikPen INJECT 8-14 UNITS INTO THE SKIN 3 TIMES A DAY BEFORE MEALS 30 mL 1   Insulin Pen Needle (BD PEN NEEDLE NANO 2ND GEN) 32G X 4 MM MISC USE ONCE DAILY AS DIRECTED 100 each 0   metFORMIN (GLUCOPHAGE) 1000 MG tablet Take 2 tablets (2,000 mg total) by mouth daily with supper. 180 tablet 3   OZEMPIC, 1 MG/DOSE, 4 MG/3ML SOPN Inject 1 mg into the skin once a week.     simvastatin (ZOCOR) 20 MG tablet TAKE 1 TABLET BY MOUTH EVERYDAY AT BEDTIME 90 tablet 3   zolpidem (AMBIEN) 10  MG tablet TAKE 1 TABLET BY MOUTH AT BEDTIME AS NEEDED FOR SLEEP 30 tablet 5   No current facility-administered medications on file prior to visit.   Allergies  Allergen Reactions   Iodine     Per the patient. Had an oral solution in the 90's that gave her a bad reaction of hallucinations.    Family History  Problem Relation Age of Onset  Thyroid disease Mother    Hyperlipidemia Mother    Breast cancer Mother 27   Skin cancer Father        not melanoma- passed at 11   Colon cancer Maternal Uncle    Stomach cancer Maternal Uncle    Lung cancer Maternal Uncle    Cancer Maternal Grandfather        unknown    Objective:   Physical Exam  BP 120/76 (BP Location: Left Arm, Patient Position: Sitting, Cuff Size: Normal)   Pulse 81   Ht '5\' 5"'$  (1.651 m)   Wt 164 lb 9.6 oz (74.7 kg)   SpO2 97%   BMI 27.39 kg/m    Wt Readings from Last 3 Encounters:  03/09/22 164 lb 9.6 oz (74.7 kg)  12/11/21 163 lb (73.9 kg)  12/01/21 160 lb 6.4 oz (72.8 kg)   Constitutional: overweight, in NAD Eyes:  EOMI, no exophthalmos ENT: no neck masses, no cervical lymphadenopathy Cardiovascular: RRR, No MRG Respiratory: CTA B Musculoskeletal: no deformities Skin:no rashes Neurological: no tremor with outstretched hands  Assessment:     1. DM2, insulin-dependent, uncontrolled, without long term complications, but with hyperglycemia  Type 1 diabetes investigation was negative: Component     Latest Ref Rng & Units 01/24/2018  ZNT8 Antibodies     U/mL <15  Islet Cell Ab     Neg:<1:1 Negative  Glutamic Acid Decarb Ab     <5 IU/mL <5  C-Peptide     0.80 - 3.85 ng/mL 2.07  Glucose, Plasma     65 - 99 mg/dL 134 (H)   2. HL  3.  Overweight    Plan:     Pt with longstanding, uncontrolled, type 2 diabetes, on basal/bolus insulin regimen and weekly GLP-1 receptor agonist along with metformin.  Her diabetes control worsened due to steroid injections in the past.  Also, she was not taking Humalog  consistently, including at last visit.  Also, she was not checking sugars consistently in the past.  HbA1c increased to 10.2%, but it did decrease afterwards.  At last visit, he was 7.0%, improved.  However, she now returns after a longer absence, at 8 months.  4 months ago she had another HbA1c which was higher, at 7.5%. -At last visit, I suggested a CGM, but she did not want to try the device.  At that time, we discussed about cutting out snacks at night. At this visit she continues to decline a CGM, but upon questioning, this is because of the price. She also continues to have snacks at night and also later dinners. -Reviewing her blood sugars in the last 2 weeks per her meter download, they appear to be either at goal or above in the morning, up to 170s, but with 1 value of 272.  She is not usually checking later in the day, but whenever she checks, this may be quite low, in the 60s.  Upon questioning, she is not taking Humalog before breakfast if sugars are low in the morning, but she takes it before the rest of the meals, especially since she is not usually checking sugars before these.  At today's visit, he mentions that she took her insulin before lunch and sugars dropped to 77.  During the visit, we rechecked her blood sugar and it was 123. -Due to the low blood sugars, I advised her to decrease the Lantus and Humalog doses, but for now we will continue the same metformin and Ozempic doses.   -We  discussed about possible remedies for constipation including MiraLAX (advised her to titrate the dose up for effect), Senna, Magnesium, before trying stronger medication like Dulcolax.  She agrees to try these. - I advised her to: Patient Instructions  Please continue: - Metformin 2000 mg with dinner - Ozempic 1 mg weekly  Change: - Lantus 35 units after dinner  - Humalog  8-12 units 15 minutes before b'fast 8-12 units 15 minutes before lunch 8-12 units 15 minutes before dinner  STOP eating after  dinner.   Please return in 3-4 months with your sugar log.  - we checked her HbA1c: 7.7% (higher) - advised to check sugars at different times of the day - 4x a day, rotating check times - advised for yearly eye exams >> she is UTD - return to clinic in 3-4 months  2. HL  -Reviewed latest lipid panel from 10/2021: LDL increased, but still lower than 100, triglycerides high, HDL low: Lab Results  Component Value Date   CHOL 148 11/13/2021   HDL 36.20 (L) 11/13/2021   LDLCALC 77 11/09/2020   LDLDIRECT 93.0 11/13/2021   TRIG 226.0 (H) 11/13/2021   CHOLHDL 4 11/13/2021  -She is on Zocor 20 mg daily without side effects  3.  Overweight -We will continue her GLP-1 receptor agonist which should also help with weight loss. -We discussed about stopping chips and eating after dinner at previous visits >> she is still snacking after dinner-again advised to stop -At last visit, she was -9 pounds after starting Ozempic -weight at last visit: 159 pounds >> gained 5 lbs  Philemon Kingdom, MD PhD [facility]

## 2022-04-11 ENCOUNTER — Other Ambulatory Visit: Payer: Self-pay | Admitting: Internal Medicine

## 2022-05-10 ENCOUNTER — Other Ambulatory Visit: Payer: Self-pay | Admitting: Family Medicine

## 2022-05-10 DIAGNOSIS — Z0000 Encounter for general adult medical examination without abnormal findings: Secondary | ICD-10-CM

## 2022-05-16 ENCOUNTER — Other Ambulatory Visit: Payer: Self-pay | Admitting: Internal Medicine

## 2022-05-16 DIAGNOSIS — E119 Type 2 diabetes mellitus without complications: Secondary | ICD-10-CM

## 2022-05-30 ENCOUNTER — Encounter: Payer: Self-pay | Admitting: Family

## 2022-05-30 ENCOUNTER — Ambulatory Visit: Payer: Commercial Managed Care - PPO | Admitting: Family

## 2022-05-30 VITALS — BP 162/95 | HR 94 | Temp 98.0°F | Ht 65.0 in | Wt 160.1 lb

## 2022-05-30 DIAGNOSIS — M5432 Sciatica, left side: Secondary | ICD-10-CM

## 2022-05-30 MED ORDER — METHYLPREDNISOLONE ACETATE 80 MG/ML IJ SUSP
80.0000 mg | Freq: Once | INTRAMUSCULAR | Status: AC
  Administered 2022-05-30: 80 mg via INTRAMUSCULAR

## 2022-05-30 MED ORDER — METHOCARBAMOL 500 MG PO TABS: 500.0000 mg | ORAL_TABLET | Freq: Four times a day (QID) | ORAL | 0 refills | Status: AC

## 2022-05-30 MED ORDER — NAPROXEN 500 MG PO TABS: 500.0000 mg | ORAL_TABLET | Freq: Two times a day (BID) | ORAL | 0 refills | Status: DC

## 2022-05-30 NOTE — Progress Notes (Signed)
Patient ID: Denise Beard, female    DOB: [birth year], 62 y.o.   MRN: [id number]  Chief Complaint  Patient presents with   Hip Pain    Pt c/o left sided hip down to leg pain. Present for 2 weeks. Has tried cold pack, heating pad, ibuprofen which did not help. Pt states it is a burning sensation.     HPI:      Hip/leg pain:   describes as a burning pain, with some numbness & tingling that  starts in her lower/sacral area on left side and radiates down her buttock, hip and top and lateral side of upper leg. Has had a previous episode in 08/2020, and states she has tried exercises and stretches to prevent new episodes, but this one is worse than last time, but she thinks she also waited longer before coming in. She took old Robaxin she had left over, Ibuprofen, applied ice with little relief.  Assessment & Plan:  1. Sciatica of left side - given steroid injection, refilled Naproxen & Robaxin, reminded pt of use & SE of meds. Advised to alternate ice with heat for up to 82mn tid, when pain has eased go back to doing stretches & exercises daily, also remember to stand from sitting when working about every hour and walk around & stretch. Also needs to try and start exercising as able to keep joints from stiffening. If another episode reoccurs soon should see Ortho for further workup.  - methylPREDNISolone acetate (DEPO-MEDROL) injection 80 mg - naproxen (NAPROSYN) 500 MG tablet; Take 1 tablet (500 mg total) by mouth 2 (two) times daily with a meal.  Dispense: 20 tablet; Refill: 0 - methocarbamol (ROBAXIN) 500 MG tablet; Take 1-2 tablets (500-1,000 mg total) by mouth 4 (four) times daily for 5 days.  Dispense: 40 tablet; Refill: 0  Subjective:    Outpatient Medications Prior to Visit  Medication Sig Dispense Refill   albuterol (VENTOLIN HFA) 108 (90 Base) MCG/ACT inhaler Inhale 2 puffs into the lungs every 6 (six) hours as needed for wheezing or shortness of breath. 1 each 2   azelastine  (ASTELIN) 0.1 % nasal spray Place 2 sprays into both nostrils 2 (two) times daily. (Patient taking differently: Place 2 sprays into both nostrils as needed.) 30 mL 12   BD PEN NEEDLE NANO 2ND GEN 32G X 4 MM MISC USE ONCE DAILY AS DIRECTED 100 each 0   budesonide-formoterol (SYMBICORT) 160-4.5 MCG/ACT inhaler Inhale 2 puffs into the lungs 2 (two) times daily. 1 each 11   guaiFENesin-codeine 100-10 MG/5ML syrup Take 5 mLs by mouth every 6 (six) hours as needed for cough. 120 mL 0   insulin glargine (LANTUS SOLOSTAR) 100 UNIT/ML Solostar Pen INJECT 35 UNITS INTO THE SKIN DAILY AT 10 PM. 30 mL 1   insulin lispro (HUMALOG KWIKPEN) 100 UNIT/ML KwikPen INJECT 8-14 UNITS INTO THE SKIN 3 TIMES A DAY BEFORE MEALS 30 mL 1   metFORMIN (GLUCOPHAGE) 1000 MG tablet Take 2 tablets (2,000 mg total) by mouth daily with supper. 180 tablet 3   Semaglutide, 1 MG/DOSE, (OZEMPIC, 1 MG/DOSE,) 4 MG/3ML SOPN INJECT '1MG'$  INTO THE SKIN ONCE A WEEK 9 mL 1   simvastatin (ZOCOR) 20 MG tablet TAKE 1 TABLET BY MOUTH EVERYDAY AT BEDTIME 90 tablet 3   zolpidem (AMBIEN) 10 MG tablet TAKE 1 TABLET BY MOUTH EVERY DAY AT BEDTIME AS NEEDED FOR SLEEP 30 tablet 5   No facility-administered medications prior to visit.   Past Medical History:  Diagnosis Date   ANXIETY 03/17/2007   in past   ASTHMA 06/26/2007   DIAB W/O COMP TYPE II/UNS NOT STATED UNCNTRL 04/06/2009   GERD 06/26/2007   Irritable bowel syndrome 02/27/2010   constipation   OA (osteoarthritis) 12/13/2009   SEIZURE DISORDER 04/17/2010   in past /over 4 years ago/no meds. actually vasovagal per neuro notes   Past Surgical History:  Procedure Laterality Date   ABDOMINAL HYSTERECTOMY     CESAREAN SECTION     TUBAL LIGATION     Allergies  Allergen Reactions   Iodine     Per the patient. Had an oral solution in the 90's that gave her a bad reaction of hallucinations.       Objective:    Physical Exam Vitals and nursing note reviewed.  Constitutional:       Appearance: Normal appearance.  Cardiovascular:     Rate and Rhythm: Normal rate and regular rhythm.  Pulmonary:     Effort: Pulmonary effort is normal.     Breath sounds: Normal breath sounds.  Musculoskeletal:     Lumbar back: Tenderness (left side) present. Decreased range of motion (left side).     Left hip: Tenderness present. No bony tenderness. Decreased range of motion.  Skin:    General: Skin is warm and dry.  Neurological:     Mental Status: She is alert.  Psychiatric:        Mood and Affect: Mood normal.        Behavior: Behavior normal.    BP (!) 162/95 (BP Location: Left Arm, Patient Position: Sitting, Cuff Size: Large)   Pulse 94   Temp 98 F (36.7 C) (Temporal)   Ht '5\' 5"'$  (1.651 m)   Wt 160 lb 2 oz (72.6 kg)   SpO2 100%   BMI 26.65 kg/m  Wt Readings from Last 3 Encounters:  05/30/22 160 lb 2 oz (72.6 kg)  03/09/22 164 lb 9.6 oz (74.7 kg)  12/11/21 163 lb (73.9 kg)      Jeanie Sewer, NP

## 2022-06-01 LAB — HM DIABETES EYE EXAM

## 2022-06-04 ENCOUNTER — Encounter: Payer: Self-pay | Admitting: Internal Medicine

## 2022-06-22 ENCOUNTER — Encounter: Payer: Self-pay | Admitting: Family Medicine

## 2022-06-22 ENCOUNTER — Ambulatory Visit: Payer: Commercial Managed Care - PPO | Admitting: Family Medicine

## 2022-06-22 VITALS — BP 110/80 | HR 79 | Temp 97.0°F | Ht 65.0 in | Wt 152.6 lb

## 2022-06-22 DIAGNOSIS — M5442 Lumbago with sciatica, left side: Secondary | ICD-10-CM | POA: Diagnosis not present

## 2022-06-22 DIAGNOSIS — E119 Type 2 diabetes mellitus without complications: Secondary | ICD-10-CM | POA: Diagnosis not present

## 2022-06-22 MED ORDER — PREDNISONE 20 MG PO TABS: ORAL_TABLET | ORAL | 0 refills | Status: DC

## 2022-06-22 NOTE — Progress Notes (Signed)
Phone [phone] In person visit   Subjective:   Denise Beard is a 62 y.o. year old very pleasant female patient who presents for/with See problem oriented charting Chief Complaint  Patient presents with   sciatic pain    Pt c/o having a sciatic pain flare up on the left leg that has been going on since 12/31.    Past Medical History-  Patient Active Problem List   Diagnosis Date Noted   Type II diabetes mellitus, well controlled ([place]) 04/06/2009    Priority: High   Hyperlipidemia associated with type 2 diabetes mellitus ([place]) 02/04/2015    Priority: Medium    Insomnia 02/04/2015    Priority: Medium    Asthma 06/26/2007    Priority: Medium    Osteoarthritis 02/04/2015    Priority: Low   Vasovagal syncope 02/04/2015    Priority: Low   Allergic rhinitis 02/04/2015    Priority: Low   Irritable bowel syndrome 02/27/2010    Priority: Low   GERD 06/26/2007    Priority: Low   Melanocytic nevi of trunk 12/11/2021   COVID-19 05/04/2021   Viral upper respiratory tract infection 05/02/2021   Smoker 04/09/2019    Medications- reviewed and updated Current Outpatient Medications  Medication Sig Dispense Refill   albuterol (VENTOLIN HFA) 108 (90 Base) MCG/ACT inhaler Inhale 2 puffs into the lungs every 6 (six) hours as needed for wheezing or shortness of breath. 1 each 2   azelastine (ASTELIN) 0.1 % nasal spray Place 2 sprays into both nostrils 2 (two) times daily. (Patient taking differently: Place 2 sprays into both nostrils as needed.) 30 mL 12   BD PEN NEEDLE NANO 2ND GEN 32G X 4 MM MISC USE ONCE DAILY AS DIRECTED 100 each 0   budesonide-formoterol (SYMBICORT) 160-4.5 MCG/ACT inhaler Inhale 2 puffs into the lungs 2 (two) times daily. 1 each 11   insulin glargine (LANTUS SOLOSTAR) 100 UNIT/ML Solostar Pen INJECT 35 UNITS INTO THE SKIN DAILY AT 10 PM. 30 mL 1   insulin lispro (HUMALOG KWIKPEN) 100 UNIT/ML KwikPen INJECT 8-14 UNITS INTO THE SKIN 3 TIMES A DAY BEFORE MEALS 30  mL 1   metFORMIN (GLUCOPHAGE) 1000 MG tablet Take 2 tablets (2,000 mg total) by mouth daily with supper. 180 tablet 3   predniSONE (DELTASONE) 20 MG tablet Take 2 pills for 3 days, 1 pill for 4 days 10 tablet 0   Semaglutide, 1 MG/DOSE, (OZEMPIC, 1 MG/DOSE,) 4 MG/3ML SOPN INJECT '1MG'$  INTO THE SKIN ONCE A WEEK 9 mL 1   simvastatin (ZOCOR) 20 MG tablet TAKE 1 TABLET BY MOUTH EVERYDAY AT BEDTIME 90 tablet 3   zolpidem (AMBIEN) 10 MG tablet TAKE 1 TABLET BY MOUTH EVERY DAY AT BEDTIME AS NEEDED FOR SLEEP 30 tablet 5   No current facility-administered medications for this visit.     Objective:  BP 110/80   Pulse 79   Temp (!) 97 F (36.1 C)   Ht '5\' 5"'$  (1.651 m)   Wt 152 lb 9.6 oz (69.2 kg)   SpO2 97%   BMI 25.39 kg/m  Gen: NAD, resting comfortably CV: RRR no murmurs rubs or gallops Lungs: CTAB no crackles, wheeze, rhonchi Ext: no edema Skin: warm, dry Back - Normal skin, Spine with normal alignment and no deformity.  No tenderness to vertebral process palpation.  Paraspinous muscles are not tender and without spasm.  She is tender over SI joint and positive FABER for pain.  Range of motion is full at neck and  lumbar sacral regions. Negative Straight leg raise for numbness/tingling but does have significant pain on the left.  Neuro- no saddle anesthesia, 5/5 strength lower extremities     Assessment and Plan    # Left low back pain with sciatica S: Patient reports symptoms started on 05/20/2022.  She was seen by Cherylann Parr, NP and given methylprednisolone 80 mg injection as well as methocarbamol for sciatica of the left side and also recommended to take a course of naproxen for 10 days.  At that time she described a burning pain with some numbness and tingling starting in her left low back radiating into her buttocks, hip, top and lateral sides of leg.  Had similar episode in April 2022 (appears she was seen by Yetta Flock Allwardt, PA and given naproxen, Robaxin and home exercises). When  she took naproxen did get some relief- but has used as needed.   Today, reports majority of pain in left lateral hip and radiating into left anterior thigh- still with some left low back pain. Has been doing home exercises and stretches that she had been given in the past without relief (plus some she saw online). Does keep her up some at night. Pain 6/10 at rest today. Stumbles some with pain. Does have numbness/tingling. No particular position makes it better or worse  ROS-No saddle anesthesia, bladder incontinence, fecal incontinence, weakness in extremity.  History negative for trauma, history of cancer, fever, chills, unintentional weight loss, recent bacterial infection, recent IV drug use, HIV, pain worse at night or while supine.  A/P: ongoing pain in left low back going into left lateral hip area and into anterior thigh- I wonder about irritation of L2 or L3 nerve root- pain with straight leg raise test but no numbness/tingling. She also is tender over SI joint and with FABER test.  She has some old gabapentin that I want her to try at least before bed to help with sleep- if really helps could also try a dose in the morning or even 3x day (be careful about sedation).  - also want to trial prednisone- may raise a1c but has bene under 8 and feels her sugars have improved -I want her to let me know if she is not at least 50% better within a week and will refer to Dr. Georgina Snell who has seen her in the past for shoulder issues  # Diabetes S: Medication:lantus 40 units, lispro 12 units with meals and on ozempic 1 mg, metformin 1g BID CBGs- feels have improved from last visit. Morning sugars 80-103 Lab Results  Component Value Date   HGBA1C 7.7 (A) 03/09/2022   HGBA1C 7.5 (H) 11/13/2021   HGBA1C 7.0 (A) 07/14/2021   A/P: I think diabetes has space for prednisone- we will trial as above- continue current medications and discussed mild adjustments in insulin she can make but ultimately managed by Dr.  Cruzita Lederer   Recommended follow up: Return for as needed for new, worsening, persistent symptoms. Future Appointments  Date Time Provider [place]  07/13/2022  3:40 PM Philemon Kingdom, MD [facility] None  11/15/2022  9:00 AM Marin Olp, MD [facility]    Lab/Order associations:   ICD-10-CM   1. Acute left-sided low back pain with left-sided sciatica  M54.42     2. Type II diabetes mellitus, well controlled ([place])  E11.9       Meds ordered this encounter  Medications   predniSONE (DELTASONE) 20 MG tablet    Sig: Take 2 pills for  3 days, 1 pill for 4 days    Dispense:  10 tablet    Refill:  0    Return precautions advised.  [name], MD

## 2022-06-22 NOTE — Patient Instructions (Addendum)
ongoing pain in left low back going into left lateral hip area and into anterior thigh- I wonder about irritation of L2 or L3 nerve root- pain with straight leg raise test but no numbness/tingling. She also is tender over SI joint and with FABER test.  She has some old gabapentin that I want her to try at least before bed to help with sleep- if really helps could also try a dose in the morning or even 3x day (be careful about sedation).  - also want to trial prednisone- may raise a1c but has bene under 8 and feels her sugars have improved -I want her to let me know if she is not at least 50% better within a week and will refer to Dr. Georgina Snell  of sports medicinewho has seen her in the past for shoulder issues  Recommended follow up: Return for as needed for new, worsening, persistent symptoms. -seek care if drastic worsening in symptoms including leg weakness or new incontinence or numbness/tingling in groin

## 2022-06-25 ENCOUNTER — Other Ambulatory Visit: Payer: Self-pay | Admitting: Family Medicine

## 2022-06-26 ENCOUNTER — Telehealth: Payer: Self-pay | Admitting: Family Medicine

## 2022-06-26 ENCOUNTER — Other Ambulatory Visit: Payer: Self-pay

## 2022-06-26 MED ORDER — GABAPENTIN 300 MG PO CAPS: 300.0000 mg | ORAL_CAPSULE | Freq: Three times a day (TID) | ORAL | 3 refills | Status: DC | PRN

## 2022-06-26 NOTE — Telephone Encounter (Signed)
Rx sent to pharmacy   

## 2022-06-26 NOTE — Telephone Encounter (Signed)
I called and left a message asking why she requested a refill of Gabapentin.

## 2022-06-26 NOTE — Telephone Encounter (Signed)
Pt having trouble with sciatica and SI joint, saw Dr. Yong Channel. She thought Dr. Yong Channel prescribed the gabapentin, so this was an error. Pt will reach out to Dr. Yong Channel for this med.

## 2022-06-26 NOTE — Telephone Encounter (Signed)
Pt states when she was here at her last appt, Dr Yong Channel wanted her to get back on  gabapentin (NEURONTIN) 300 MG capsule   But there is no Rx for medication. Please advise.

## 2022-07-13 ENCOUNTER — Ambulatory Visit: Payer: Commercial Managed Care - PPO | Admitting: Internal Medicine

## 2022-07-13 ENCOUNTER — Encounter: Payer: Self-pay | Admitting: Internal Medicine

## 2022-07-13 VITALS — BP 120/68 | HR 81 | Ht 65.0 in | Wt 155.2 lb

## 2022-07-13 DIAGNOSIS — E119 Type 2 diabetes mellitus without complications: Secondary | ICD-10-CM

## 2022-07-13 DIAGNOSIS — E785 Hyperlipidemia, unspecified: Secondary | ICD-10-CM | POA: Diagnosis not present

## 2022-07-13 DIAGNOSIS — E663 Overweight: Secondary | ICD-10-CM | POA: Diagnosis not present

## 2022-07-13 LAB — POCT GLYCOSYLATED HEMOGLOBIN (HGB A1C): Hemoglobin A1C: 7.1 % — AB (ref 4.0–5.6)

## 2022-07-13 NOTE — Progress Notes (Signed)
Subjective:     Patient ID: Denise Beard, female   DOB: 1961/01/10, 62 y.o.   MRN: [id number]  Diabetes  Ms. Veleta is a pleasant 62 y.o. woman, returning for f/u for DM2, dx 2012, insulin-dependent, uncontrolled, without long-term complications. Last visit 4 months ago.  Interim history: No increased urination, nausea, chest pain.   She has constipation from Ozempic >> improved. She has been doing the [employer] x 1 mo and now just finished a 3-day fast.  She lost 9 pounds since last visit and feels that her sugars are better.  Reviewed HbA1c levels: Lab Results  Component Value Date   HGBA1C 7.7 (A) 03/09/2022   HGBA1C 7.5 (H) 11/13/2021   HGBA1C 7.0 (A) 07/14/2021   She is on: - Metformin 1000 mg 2x a day with meals >> 2000 mg with dinner - Trulicity 1.5 >> 3 mg weekly  >> Ozempic 1 mg weekly (off x 3 weeks) - Lantus 30 >> 40 >> 35 >> 40 >> 35 units after dinner  - Humalog: 8-12 units 15 minutes before b'fast 8-12 units 15 minutes before lunch 8-12 units 15 minutes before dinner We stopped glipizide due to dizziness. She was on saxagliptin/metformin XR 09/998 mg (Kombyglyze) in the past. She was on JanuMet 50/1000 bid in 2015 >> lightheaded, HA, blurry vision.  She is checking sugars 1-2 times a day: - am:  73-263, 405 >> 95-184, 225 >> 89-142, 272 >> 80s, 94-174 - 2h after b'fast:   111-262, 382 >> 148-228, 244 >> n/c - before lunch:  55, 104, 148 >> 99-321 >> 93-198 >> 63 >> 106-143 - 2h after lunch:  78, 100-179 >> 160-300  >> 84-182, 232 >> 77 >> n/c - before dinner: 100-145, 211, 224 >> n/c >> 80-109, 184 >> 103 >> n/c - 2h after dinner:138 >> 160-190 >> 227, 259 >> 89-114 >> n/c - bedtime:  n/c >> 216-381 >> 199  >> n/c >> 98 >> n/c - nighttime: 109-188 >> n/c >> 125-170 >> n/c Lowest: 55 >> 73 >> 80 >> 63 >> 80s Highest: 243 >> 500s >> 244 >> 272 >> 174  Meter:  One Touch ultra 2  She saw nutrition in the past.  No CKD; latest BUN/creatinine: Lab Results   Component Value Date   BUN 9 11/13/2021   CREATININE 0.71 11/13/2021   No MAU: Lab Results  Component Value Date   MICRALBCREAT 1.1 11/13/2021   MICRALBCREAT 0.8 11/09/2020   MICRALBCREAT 1.1 10/23/2019   MICRALBCREAT 8 05/27/2018   MICRALBCREAT 0.9 05/24/2017   MICRALBCREAT 0.8 04/06/2016   MICRALBCREAT 2.4 02/04/2015   MICRALBCREAT 0.4 10/30/2013   MICRALBCREAT 18.2 07/17/2013   MICRALBCREAT 1.9 10/06/2012   + HL; lipids:  Lab Results  Component Value Date   CHOL 148 11/13/2021   HDL 36.20 (L) 11/13/2021   LDLCALC 77 11/09/2020   LDLDIRECT 93.0 11/13/2021   TRIG 226.0 (H) 11/13/2021   CHOLHDL 4 11/13/2021  On Zocor 20.  - Last eye exam 06/01/2022: No DR  (Dr Macarthur Critchley - [facility]).    - No numbness or tingling in her feet.  Last foot exam: Dr. Yong Channel 11/13/2021.  She also has a history of anxiety, asthma, GERD, constipation; polyarthropathy; seizure disorder.   Review of Systems + See HPI  I reviewed pt's medications, allergies, PMH, social hx, family hx, and changes were documented in the history of present illness. Otherwise, unchanged from my initial visit note.  Past Medical History:  Diagnosis Date   ANXIETY 03/17/2007   in past   ASTHMA 06/26/2007   DIAB W/O COMP TYPE II/UNS NOT STATED UNCNTRL 04/06/2009   GERD 06/26/2007   Irritable bowel syndrome 02/27/2010   constipation   OA (osteoarthritis) 12/13/2009   SEIZURE DISORDER 04/17/2010   in past /over 4 years ago/no meds. actually vasovagal per neuro notes   Past Surgical History:  Procedure Laterality Date   ABDOMINAL HYSTERECTOMY     CESAREAN SECTION     TUBAL LIGATION     Social History   Socioeconomic History   Marital status: Divorced    Spouse name: Not on file   Number of children: Not on file   Years of education: Not on file   Highest education level: Not on file  Occupational History   Not on file  Tobacco Use   Smoking status: Never   Smokeless tobacco: Never   Substance and Sexual Activity   Alcohol use: No   Drug use: No   Sexual activity: Not on file  Other Topics Concern   Not on file  Social History Narrative   Divorced/Single. 2 chldren. 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 1 each 2   azelastine (ASTELIN) 0.1 % nasal spray Place 2 sprays into both nostrils 2 (two) times daily. (Patient taking differently: Place 2 sprays into both nostrils as needed.) 30 mL 12   BD PEN NEEDLE NANO 2ND GEN 32G X 4 MM MISC USE ONCE DAILY AS DIRECTED 100 each 0   budesonide-formoterol (SYMBICORT) 160-4.5 MCG/ACT inhaler Inhale 2 puffs into the lungs 2 (two) times daily. 1 each 11   gabapentin (NEURONTIN) 300 MG capsule Take 1 capsule (300 mg total) by mouth 3 (three) times daily as needed. 90 capsule 3   insulin glargine (LANTUS SOLOSTAR) 100 UNIT/ML Solostar Pen INJECT 35 UNITS INTO THE SKIN DAILY AT 10 PM. 30 mL 1   insulin lispro (HUMALOG KWIKPEN) 100 UNIT/ML KwikPen INJECT 8-14 UNITS INTO THE SKIN 3 TIMES A DAY BEFORE MEALS 30 mL 1   metFORMIN (GLUCOPHAGE) 1000 MG tablet Take 2 tablets (2,000 mg total) by mouth daily with supper. 180 tablet 3   predniSONE (DELTASONE) 20 MG tablet Take 2 pills for 3 days, 1 pill for 4 days 10 tablet 0   Semaglutide, 1 MG/DOSE, (OZEMPIC, 1 MG/DOSE,) 4  MG/3ML SOPN INJECT '1MG'$  INTO THE SKIN ONCE A WEEK 9 mL 1   simvastatin (ZOCOR) 20 MG tablet TAKE 1 TABLET BY MOUTH EVERYDAY AT BEDTIME 90 tablet 3   zolpidem (AMBIEN) 10 MG tablet TAKE 1 TABLET BY MOUTH EVERY DAY AT BEDTIME AS NEEDED FOR SLEEP 30 tablet 5   No current facility-administered medications on file prior to visit.  Allergies  Allergen Reactions   Iodine     Per the patient. Had an oral solution in the 90's that gave her a bad reaction of hallucinations.    Family History  Problem Relation Age of Onset   Thyroid disease Mother    Hyperlipidemia Mother    Breast cancer Mother 21   Skin cancer Father        not melanoma- passed at 13   Colon cancer Maternal Uncle    Stomach cancer Maternal Uncle    Lung cancer Maternal Uncle    Cancer Maternal Grandfather        unknown    Objective:   Physical Exam  BP 120/68 (BP Location: Left Arm, Patient Position: Sitting, Cuff Size: Normal)   Pulse 81   Ht '5\' 5"'$  (1.651 m)   Wt 155 lb 3.2 oz (70.4 kg)   SpO2 97%   BMI 25.83 kg/m    Wt Readings from Last 3 Encounters:  07/13/22 155 lb 3.2 oz (70.4 kg)  06/22/22 152 lb 9.6 oz (69.2 kg)  05/30/22 160 lb 2 oz (72.6 kg)   Constitutional: overweight, in NAD Eyes:  EOMI, no exophthalmos ENT: no neck masses, no cervical lymphadenopathy Cardiovascular: RRR, No MRG Respiratory: CTA B Musculoskeletal: no deformities Skin:no rashes Neurological: no tremor with outstretched hands  Assessment:     1. DM2, insulin-dependent, uncontrolled, without long term complications, but with hyperglycemia  Type 1 diabetes investigation was negative: Component     Latest Ref Rng & Units 01/24/2018  ZNT8 Antibodies     U/mL <15  Islet Cell Ab     Neg:<1:1 Negative  Glutamic Acid Decarb Ab     <5 IU/mL <5  C-Peptide     0.80 - 3.85 ng/mL 2.07  Glucose, Plasma     65 - 99 mg/dL 134 (H)   2. HL  3.  Overweight    Plan:     Pt with longstanding, uncontrolled, type 2 diabetes, on  metformin, basal/bolus insulin regimen and weekly GLP-1 receptor agonist, with suboptimal control.  She declined a CGM in the past. -At visit, sugars were mostly at goal in the morning but she was not checking later in the day.  She only had few checks later in the day and some of these were quite low, in the 60s.  She was not taking Humalog before breakfast if the sugars were low in the morning but she was taking it before the rest of the meals.  Due to the low blood sugars, I advised her to decrease the Lantus and Humalog doses will continue the same dose of metformin and Ozempic.  Since she had constipation from [place], we discussed about possible remedies including MiraLAX, senna, magnesium, before trying stronger medications like Dulcolax. -At today's visit, she is still only checking blood sugars in the morning with an occasional check at lunchtime.  They are but also few of them above target.  We discussed about trying to check some sugars later in the day, also.  Based on the HbA1c obtained today, she does have overall improvement in her diabetes, most likely use in the past.  I strongly advised him to continue a version of the fast.  She feels that one of the major advantages of the last is not eating after dinner, which allowed her to have better sugars in the morning.  She is planning to continue with this.  Will not change her regimen for now. - I advised her to:  Patient Instructions  Please continue: - Metformin 2000 mg with dinner - Ozempic 1 mg weekly - Lantus 35 units after dinner  - Humalog  8-12 units 15 minutes before b'fast 8-12 units 15 minutes before lunch 8-12 units 15 minutes before dinner  Please return in 6 months with your sugar log.  - we checked her HbA1c: 7.1% (lower) - advised to check sugars at different times of the day - 4x a day, rotating check times - advised for yearly eye exams >> she is UTD - return to clinic in 6 months - per her preference, due to the high  co-pay for the visits  2. HL  -Reviewed latest lipid panel from 10/2021: LDL at goal, triglycerides high, HDL slightly low: Lab Results  Component Value Date   CHOL 148 11/13/2021   HDL 36.20 (L) 11/13/2021   LDLCALC 77 11/09/2020   LDLDIRECT 93.0 11/13/2021   TRIG 226.0 (H) 11/13/2021   CHOLHDL 4 11/13/2021  -She continues on Zocor 20 mg daily without side effects  3.  Overweight -Will continue her GLP-1 receptor agonist which should also help with weight loss -She gained 5 pounds before last visit, previously lost 9 -she lost 9 lbs since last OV  Philemon Kingdom, MD PhD [facility]

## 2022-07-13 NOTE — Patient Instructions (Addendum)
Please continue: - Metformin 2000 mg with dinner - Ozempic 1 mg weekly - Lantus 35 units after dinner  - Humalog  8-12 units 15 minutes before b'fast 8-12 units 15 minutes before lunch 8-12 units 15 minutes before dinner  Please return in 6 months with your sugar log.

## 2022-09-22 ENCOUNTER — Other Ambulatory Visit: Payer: Self-pay | Admitting: Family Medicine

## 2022-09-28 ENCOUNTER — Other Ambulatory Visit: Payer: Self-pay | Admitting: Internal Medicine

## 2022-11-06 ENCOUNTER — Other Ambulatory Visit: Payer: Self-pay | Admitting: Family Medicine

## 2022-11-06 DIAGNOSIS — Z0000 Encounter for general adult medical examination without abnormal findings: Secondary | ICD-10-CM

## 2022-11-06 NOTE — Telephone Encounter (Signed)
Pt is requesting refill   Last rx 05/10/22   Refill 5 Last OV 06/22/22 Next OV 11/15/22

## 2022-11-15 ENCOUNTER — Encounter: Payer: Self-pay | Admitting: Family Medicine

## 2022-11-15 ENCOUNTER — Ambulatory Visit (INDEPENDENT_AMBULATORY_CARE_PROVIDER_SITE_OTHER): Payer: Commercial Managed Care - PPO | Admitting: Family Medicine

## 2022-11-15 VITALS — BP 104/70 | HR 86 | Temp 98.1°F | Ht 65.0 in | Wt 155.6 lb

## 2022-11-15 DIAGNOSIS — L989 Disorder of the skin and subcutaneous tissue, unspecified: Secondary | ICD-10-CM

## 2022-11-15 DIAGNOSIS — Z7984 Long term (current) use of oral hypoglycemic drugs: Secondary | ICD-10-CM

## 2022-11-15 DIAGNOSIS — E1169 Type 2 diabetes mellitus with other specified complication: Secondary | ICD-10-CM

## 2022-11-15 DIAGNOSIS — J4540 Moderate persistent asthma, uncomplicated: Secondary | ICD-10-CM | POA: Diagnosis not present

## 2022-11-15 DIAGNOSIS — Z0000 Encounter for general adult medical examination without abnormal findings: Secondary | ICD-10-CM | POA: Diagnosis not present

## 2022-11-15 DIAGNOSIS — E119 Type 2 diabetes mellitus without complications: Secondary | ICD-10-CM

## 2022-11-15 DIAGNOSIS — E785 Hyperlipidemia, unspecified: Secondary | ICD-10-CM

## 2022-11-15 LAB — COMPREHENSIVE METABOLIC PANEL
ALT: 13 U/L (ref 0–35)
AST: 19 U/L (ref 0–37)
Albumin: 4.3 g/dL (ref 3.5–5.2)
Alkaline Phosphatase: 88 U/L (ref 39–117)
BUN: 8 mg/dL (ref 6–23)
CO2: 25 mEq/L (ref 19–32)
Calcium: 10.2 mg/dL (ref 8.4–10.5)
Chloride: 105 mEq/L (ref 96–112)
Creatinine, Ser: 0.71 mg/dL (ref 0.40–1.20)
GFR: 92.39 mL/min (ref >60.00)
Glucose, Bld: 165 mg/dL — ABNORMAL HIGH (ref 70–99)
Potassium: 5.1 mEq/L (ref 3.5–5.1)
Sodium: 140 mEq/L (ref 135–145)
Total Bilirubin: 0.5 mg/dL (ref 0.2–1.2)
Total Protein: 7.4 g/dL (ref 6.0–8.3)

## 2022-11-15 LAB — CBC WITH DIFFERENTIAL/PLATELET
Basophils Absolute: 0 10*3/uL (ref 0.0–0.1)
Basophils Relative: 0.3 % (ref 0.0–3.0)
Eosinophils Absolute: 0.1 10*3/uL (ref 0.0–0.7)
Eosinophils Relative: 1.5 % (ref 0.0–5.0)
HCT: 39.7 % (ref 36.0–46.0)
Hemoglobin: 12.7 g/dL (ref 12.0–15.0)
Lymphocytes Relative: 44 % (ref 12.0–46.0)
Lymphs Abs: 3.3 10*3/uL (ref 0.7–4.0)
MCHC: 32 g/dL (ref 30.0–36.0)
MCV: 92.1 fl (ref 78.0–100.0)
Monocytes Absolute: 0.6 10*3/uL (ref 0.1–1.0)
Monocytes Relative: 7.6 % (ref 3.0–12.0)
Neutro Abs: 3.5 10*3/uL (ref 1.4–7.7)
Neutrophils Relative %: 46.6 % (ref 43.0–77.0)
Platelets: 282 10*3/uL (ref 150.0–400.0)
RBC: 4.31 Mil/uL (ref 3.87–5.11)
RDW: 12.8 % (ref 11.5–15.5)
WBC: 7.6 10*3/uL (ref 4.0–10.5)

## 2022-11-15 LAB — LIPID PANEL
Cholesterol: 178 mg/dL (ref 0–200)
HDL: 41.3 mg/dL (ref >39.00)
NonHDL: 136.2
Total CHOL/HDL Ratio: 4
Triglycerides: 228 mg/dL — ABNORMAL HIGH (ref 0.0–149.0)
VLDL: 45.6 mg/dL — ABNORMAL HIGH (ref 0.0–40.0)

## 2022-11-15 LAB — MICROALBUMIN / CREATININE URINE RATIO
Creatinine,U: 109.2 mg/dL
Microalb Creat Ratio: 0.6 mg/g (ref 0.0–30.0)
Microalb, Ur: <0.7 mg/dL (ref 0.0–1.9)

## 2022-11-15 LAB — LDL CHOLESTEROL, DIRECT: Direct LDL: 107 mg/dL

## 2022-11-15 NOTE — Patient Instructions (Addendum)
Team she had updated pap last year she thinks- please request records just for pap  Please stop by lab before you go If you have mychart- we will send your results within 3 business days of [place] receiving them.  If you do not have mychart- we will call you about results within 5 business days of [place] receiving them.  *please also note that you will see labs on mychart as soon as they post. I will later go in and write notes on them- will say "notes from Dr. Durene Cal"   We have placed a referral for you today to dermatology specialists-  you can call them in a week if you have not heard. We are your backup if you have any issues Phone: [phone]  Recommended follow up: Return in about 1 year (around 11/15/2023) for physical or sooner if needed.Schedule b4 you leave.

## 2022-11-15 NOTE — Progress Notes (Signed)
Phone [phone]   Subjective:  Patient presents today for their annual physical. Chief complaint-noted.   See problem oriented charting- ROS- full  review of systems was completed and negative Per full ROS sheet completed by patient  The following were reviewed and entered/updated in epic: Past Medical History:  Diagnosis Date   ANXIETY 03/17/2007   in past   ASTHMA 06/26/2007   DIAB W/O COMP TYPE II/UNS NOT STATED UNCNTRL 04/06/2009   GERD 06/26/2007   Irritable bowel syndrome 02/27/2010   constipation   OA (osteoarthritis) 12/13/2009   SEIZURE DISORDER 04/17/2010   in past /over 4 years ago/no meds. actually vasovagal per neuro notes   Patient Active Problem List   Diagnosis Date Noted   Type II diabetes mellitus, well controlled (HCC) 04/06/2009    Priority: High   Hyperlipidemia associated with type 2 diabetes mellitus (HCC) 02/04/2015    Priority: Medium    Insomnia 02/04/2015    Priority: Medium    Asthma 06/26/2007    Priority: Medium    Osteoarthritis 02/04/2015    Priority: Low   Vasovagal syncope 02/04/2015    Priority: Low   Allergic rhinitis 02/04/2015    Priority: Low   Irritable bowel syndrome 02/27/2010    Priority: Low   GERD 06/26/2007    Priority: Low   Melanocytic nevi of trunk 12/11/2021   Past Surgical History:  Procedure Laterality Date   ABDOMINAL HYSTERECTOMY     CESAREAN SECTION     TUBAL LIGATION      Family History  Problem Relation Age of Onset   Thyroid disease Mother    Hyperlipidemia Mother    Breast cancer Mother 67   Arthritis Mother    Cancer Mother    Hypertension Mother    Skin cancer Father        not melanoma- passed at 47   Colon cancer Maternal Uncle    Stomach cancer Maternal Uncle    Lung cancer Maternal Uncle    Cancer Maternal Grandfather        unknown   Diabetes Maternal Grandfather     Medications- reviewed and updated Current Outpatient Medications  Medication Sig Dispense Refill   albuterol  (VENTOLIN HFA) 108 (90 Base) MCG/ACT inhaler Inhale 2 puffs into the lungs every 6 (six) hours as needed for wheezing or shortness of breath. 1 each 2   azelastine (ASTELIN) 0.1 % nasal spray Place 2 sprays into both nostrils 2 (two) times daily. (Patient taking differently: Place 2 sprays into both nostrils as needed.) 30 mL 12   BD PEN NEEDLE NANO 2ND GEN 32G X 4 MM MISC USE ONCE DAILY AS DIRECTED 100 each 0   budesonide-formoterol (SYMBICORT) 160-4.5 MCG/ACT inhaler Inhale 2 puffs into the lungs 2 (two) times daily. 1 each 11   gabapentin (NEURONTIN) 300 MG capsule Take 1 capsule (300 mg total) by mouth 3 (three) times daily as needed. 90 capsule 3   insulin glargine (LANTUS SOLOSTAR) 100 UNIT/ML Solostar Pen INJECT 35 UNITS INTO THE SKIN DAILY AT 10 PM. 30 mL 1   insulin lispro (HUMALOG KWIKPEN) 100 UNIT/ML KwikPen INJECT 8-14 UNITS INTO THE SKIN 3 TIMES A DAY BEFORE MEALS 30 mL 1   metFORMIN (GLUCOPHAGE) 1000 MG tablet Take 2 tablets (2,000 mg total) by mouth daily with supper. 180 tablet 3   Semaglutide, 1 MG/DOSE, (OZEMPIC, 1 MG/DOSE,) 4 MG/3ML SOPN INJECT 1MG  INTO THE SKIN ONCE A WEEK 9 mL 1   simvastatin (ZOCOR) 20 MG tablet  TAKE 1 TABLET BY MOUTH EVERYDAY AT BEDTIME 90 tablet 3   zolpidem (AMBIEN) 10 MG tablet TAKE 1 TABLET BY MOUTH AT BEDTIME AS NEEDED FOR SLEEP (Patient not taking: Reported on 11/15/2022) 30 tablet 5   No current facility-administered medications for this visit.    Allergies-reviewed and updated Allergies  Allergen Reactions   Iodine     Per the patient. Had an oral solution in the 90's that gave her a bad reaction of hallucinations.     Social History   Social History Narrative   Divorced/Single. 2 chldren. 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 Oropharynx normal Neck: no thyromegaly CV: RRR no murmurs rubs or gallops Lungs: CTAB no crackles, wheeze, rhonchi Abdomen: soft/nontender/nondistended/normal bowel sounds. No rebound or guarding.  Ext: no edema Skin: warm, dry Neuro: grossly normal, moves all extremities, PERRLA Diabetic Foot Exam - Simple   Simple Foot Form Diabetic Foot exam was performed with the following findings: Yes 11/15/2022  9:29 AM  Visual Inspection No deformities, no ulcerations, no other skin breakdown bilaterally: Yes Sensation Testing Intact to touch and monofilament testing bilaterally: Yes Pulse Check Posterior Tibialis and Dorsalis pulse intact bilaterally: Yes Comments       Assessment and Plan   62 y.o. female presenting for annual physical.  Health Maintenance counseling: 1. Anticipatory guidance: Patient counseled regarding regular dental exams -q6 months, eye exams - yearly,  avoiding smoking and second hand smoke , limiting alcohol to 1 beverage per day , no illicit drugs .   2. Risk factor reduction:  Advised patient of need for regular exercise and diet rich and fruits and vegetables to reduce risk of heart attack and stroke.  Exercise- none right now- encouraged to start to help with her diabetes. Harder with ongly doing office 2 days a week and being at home Diet/weight management-down 7 lbs from last Comprehensive Physical Exam (CPE) preventive care annual visit - she feels like this is related to Ozempic and decreased appetite.  Wt Readings from Last 3 Encounters:  11/15/22 155 lb 9.6 oz (70.6 kg)  07/13/22 155 lb 3.2 oz (70.4 kg)  06/22/22 152 lb 9.6 oz (69.2 kg)  3. Immunizations/screenings/ancillary studies- up to date  Immunization History  Administered Date(s) Administered   Influenza Whole 02/19/2011    Influenza,inj,Quad PF,6+ Mos 05/24/2017, 04/05/2018, 04/23/2019, 03/10/2021, 01/30/2022   Influenza-Unspecified 02/09/2015, 01/24/2016, 04/30/2018   Moderna SARS-COV2 Booster Vaccination 03/15/2020   Moderna Sars-Covid-2 Vaccination 07/30/2019, 09/01/2019   Pneumococcal Conjugate-13 11/10/2014   Pneumococcal Polysaccharide-23 10/23/2019   Td 09/02/2007   Tdap 10/17/2018   Zoster Recombinat (Shingrix) 11/13/2021, 01/30/2022  4. Cervical cancer screening- follows with Dr. Vickey Sages with normal Pap October 2019 with negative HPV-good for 5 years - may have had one last year- request records 5. Breast cancer screening-  breast exam with GYN and mammogram 06/26/21 and planned repeat in august 6. Colon cancer screening - 02/17/15 with 10 year follow 7. Skin cancer  screening- family history skin cancer in dad- has a spot growing over left upper chest last year-  refer back to dermatology last year- also has some spots on back she wanted looked at- she reports  never seen- see below. advised regular sunscreen use.  8. Birth control/STD check- not dating/not sexually active. Also postmenopausal 9. Osteoporosis screening at 84- reports had with GYN within last few years- consider repeat at 67  10. Smoking associated screening - never smoker  Status of chronic or acute concerns   #Skin lesion- has likely seborrheic keratosis on left upper chest but refer back to dermatology for their opinion (checked years ago at dermatology specialists) - also similar on left ankle (that is newer)  % Diabetes-managed by Dr. Charlean Sanfilippo: Medication: Ozempic 1 mg, Lantus, insulin lispro, metformin 1000 mg twice daily CBGs- no lows Lab Results  Component Value Date   HGBA1C 7.1 (A) 07/13/2022   HGBA1C 7.7 (A) 03/09/2022   HGBA1C 7.5 (H) 11/13/2021  A/P: suspect stable - wants to check at next visit with Dr. Elvera Lennox and tighten a few things up before then and start exercise. Opts out of a1c today   #hyperlipidemia S:  Medication: Simvastatin 20 mg Lab Results  Component Value Date   CHOL 148 11/13/2021   HDL 36.20 (L) 11/13/2021   LDLCALC 77 11/09/2020   LDLDIRECT 93.0 11/13/2021   TRIG 226.0 (H) 11/13/2021   CHOLHDL 4 11/13/2021   A/P: close to ideal goal last year with LDL 93- preference would be under 70- working on lifestyle can help- hold steady on medicine  # Asthma S: Maintenance Medication: Symbicort helpful As needed medication: albuterol only with illness  A/P: stable- continue current medicines    #Insomnia S: Ambien 10 mg, patient aware we need to reduce this to 5 mg at age 58 -still imperfect sleep A/P: doing as well as she has- but still imperfect- at 68 will still need to reduce dose    #Allergic rhinitis S: Medication: Astelin nasal spray 2 sprays twice daily A/P: reasonable control - continue current medications   #sciatic nerve pain- intermittent issues and takes gabapentin then.  - wants to hold off on sports medicine or orthopedics . Has seen Clementeen Graham, MD in past  Recommended follow up: Return in about 1 year (around 11/15/2023) for physical or sooner if needed.Schedule b4 you leave. Future Appointments  Date Time Provider Department Center  01/11/2023  3:40 PM Carlus Pavlov, MD [id number] None   Lab/Order associations: fasting   ICD-10-CM   1. Routine general medical examination at a health care facility  Z00.00     2. Type II diabetes mellitus, well controlled (HCC)  E11.9 Urine Microalbumin w/creat. ratio    Comprehensive metabolic panel    CBC with Differential/Platelet    Lipid panel    3. Hyperlipidemia associated with type 2 diabetes mellitus (HCC)  E11.69    E78.5     4. Moderate persistent asthma without complication  J45.40     5. Skin lesion  L98.9 Ambulatory referral to Dermatology      No orders of the defined types were placed in this encounter.   Return precautions advised.  Tana Conch, MD

## 2022-11-16 ENCOUNTER — Encounter: Payer: Self-pay | Admitting: Family Medicine

## 2022-12-03 ENCOUNTER — Other Ambulatory Visit: Payer: Self-pay | Admitting: Internal Medicine

## 2022-12-11 ENCOUNTER — Telehealth (INDEPENDENT_AMBULATORY_CARE_PROVIDER_SITE_OTHER): Payer: Commercial Managed Care - PPO | Admitting: Family Medicine

## 2022-12-11 VITALS — Temp 97.2°F | Ht 65.0 in | Wt 159.0 lb

## 2022-12-11 DIAGNOSIS — U071 COVID-19: Secondary | ICD-10-CM | POA: Diagnosis not present

## 2022-12-11 MED ORDER — MOLNUPIRAVIR EUA 200MG CAPSULE: 4.0000 | ORAL_CAPSULE | Freq: Two times a day (BID) | ORAL | 0 refills | Status: AC

## 2022-12-11 MED ORDER — BENZONATATE 100 MG PO CAPS
ORAL_CAPSULE | ORAL | 0 refills | Status: DC
Start: 2022-12-11 — End: 2023-07-05

## 2022-12-11 NOTE — Patient Instructions (Addendum)
---------------------------------------------------------------------------------------------------------------------------    WORK SLIP:  Patient Denise Beard,  15-Apr-1961, was seen for a medical visit today, 12/11/22 . Please excuse from work for a COVID illness. Patient will likely be contagious for an average of 7-10 days from the onset of symptoms, PLUS 1 day of no fever and improved symptoms. Other viruses tend to be contagious for 5-10 days on average. Will defer to employer for a sooner return to work per [employer] guideline. Advise that patient remain home until no fever for 24 hour, is feeling better, and that the patient wear a high-quality, tight fitting mask such as N95 or KN95 at all times for 10 days since the onset of illness.   Sincerely: E-signature: Dr. Kriste Basque, DO [facility] Ph: [phone]   ------------------------------------------------------------------------------------------------------------------------------    HOME CARE TIPS:    -I sent the medication(s) we discussed to your pharmacy: Meds ordered this encounter  Medications   molnupiravir EUA (LAGEVRIO) 200 mg CAPS capsule    Sig: Take 4 capsules (800 mg total) by mouth 2 (two) times daily for 5 days.    Dispense:  40 capsule    Refill:  0   benzonatate (TESSALON PERLES) 100 MG capsule    Sig: 1-2 capsules up to twice daily as needed for cough.    Dispense:  30 capsule    Refill:  0     -I sent in the Covid19 treatment or referral you requested per our discussion. Please see the information provided below and discuss further with the pharmacist/treatment team.    -there is a chance of rebound illness with covid after improving. This can happen whether or not you take an antiviral treatment. If you become sick again with covid after getting better, please schedule a follow up virtual visit and isolate again.  -can use tylenol if needed for fevers, aches and pains per  instructions  -nasal saline sinus rinses twice daily  -stay hydrated, drink plenty of fluids and eat small healthy meals - avoid dairy  -can take 1000 IU ( ) Vit D3 and 100-500 mg of Vit C daily per instructions  -follow up with your doctor in 2-3 days unless improving and feeling better  -stay home while sick, except to seek medical care. If you have COVID19, you will likely be contagious for 7-10 days. Flu or Influenza is likely contagious for about 7 days. Other respiratory viral infections remain contagious for 5-10+ days depending on the virus and many other factors. Wear a good mask that fits snugly (such as N95 or KN95) if around others to reduce the risk of transmission.  It was nice to meet you today, and I really hope you are feeling better soon. I help [facility] out with telemedicine visits on Tuesdays and Thursdays and am happy to help if you need a follow up virtual visit on those days. Otherwise, if you have any concerns or questions following this visit please schedule a follow up visit with your Primary Care doctor or seek care at a local urgent care clinic to avoid delays in care.    Seek in person care or schedule a follow up video visit promptly if your symptoms worsen, new concerns arise or you are not improving with treatment. Call 911 and/or seek emergency care if your symptoms are severe or life threatening.   PLEASE SEE THE FOLLOWING LINK FOR THE MOST UPDATED INFORMATION ABOUT LAGEVRIO:  [email or web address]      Fact Sheet for Patients  And Caregivers Emergency Use Authorization (EUA) Of LAGEVRIOT (molnupiravir) capsules For Coronavirus Disease 2019 (COVID-19)  What is the most important information I should know about LAGEVRIO? LAGEVRIO may cause serious side effects, including: ? LAGEVRIO may cause harm to your unborn baby. It is not known if LAGEVRIO will harm your baby if you take LAGEVRIO during pregnancy. o LAGEVRIO is not recommended for use  in pregnancy. o LAGEVRIO has not been studied in pregnancy. LAGEVRIO was studied in pregnant animals only. When LAGEVRIO was given to pregnant animals, LAGEVRIO caused harm to their unborn babies. o You and your healthcare provider may decide that you should take LAGEVRIO during pregnancy if there are no other COVID-19 treatment options approved or authorized by the FDA that are accessible or clinically appropriate for you. o If you and your healthcare provider decide that you should take LAGEVRIO during pregnancy, you and your healthcare provider should discuss the known and potential benefits and the potential risks of taking LAGEVRIO during pregnancy. For individuals who are able to become pregnant: ? You should use a reliable method of birth control (contraception) consistently and correctly during treatment with LAGEVRIO and for 4 days after the last dose of LAGEVRIO. Talk to your healthcare provider about reliable birth control methods. ? Before starting treatment with [facility] your healthcare provider may do a pregnancy test to see if you are pregnant before starting treatment with LAGEVRIO. ? Tell your healthcare provider right away if you become pregnant or think you may be pregnant during treatment with LAGEVRIO. Pregnancy Surveillance Program: ? There is a pregnancy surveillance program for individuals who take LAGEVRIO during pregnancy. The purpose of this program is to collect information about the health of you and your baby. Talk to your healthcare provider about how to take part in this program. ? If you take LAGEVRIO during pregnancy and you agree to participate in the pregnancy surveillance program and allow your healthcare provider to share your information with [employer], then your healthcare provider will report your use of LAGEVRIO during pregnancy to [employer] by calling [phone] or RelayImage.ca. For individuals who are  sexually active with partners who are able to become pregnant: ? It is not known if LAGEVRIO can affect sperm. While the risk is regarded as low, animal studies to fully assess the potential for LAGEVRIO to affect the babies of males treated with LAGEVRIO have not been completed. A reliable method of birth control (contraception) should be used consistently and correctly during treatment with LAGEVRIO and for at least 3 months after the last dose. The risk to sperm beyond 3 months is not known. Studies to understand the risk to sperm beyond 3 months are ongoing. Talk to your healthcare provider about reliable birth control methods. Talk to your healthcare provider if you have questions or concerns about how LAGEVRIO may affect sperm. You are being given this fact sheet because your healthcare provider believes it is necessary to provide you with LAGEVRIO for the treatment of adults with mild-to-moderate coronavirus disease 2019 (COVID-19) with positive results of direct SARS-CoV-2 viral testing, and who are at high risk for progression to severe COVID-19 including hospitalization or death, and for whom other COVID-19 treatment options approved or authorized by the FDA are not accessible or clinically appropriate. The U.S. Food and Drug Administration (FDA) has issued an Emergency Use Authorization (EUA) to make LAGEVRIO available during the COVID-19 pandemic (for more details about an EUA please see "What is an  Emergency Use Authorization?" at the end of this document). LAGEVRIO is not an FDA-approved medicine in the [place]. Read this Fact Sheet for information about LAGEVRIO. Talk to your healthcare provider about your options if you have any questions. It is your choice to take LAGEVRIO.  What is COVID-19? COVID-19 is caused by a virus called a coronavirus. You can get COVID-19 through close contact with another person who has the virus. COVID-19 illnesses have ranged from very  mild-to-severe, including illness resulting in death. While information so far suggests that most COVID-19 illness is mild, serious illness can happen and may cause some of your other medical conditions to become worse. Older people and people of all ages with severe, long lasting (chronic) medical conditions like heart disease, lung disease and diabetes, for example seem to be at higher risk of being hospitalized for COVID-19.  What is LAGEVRIO? LAGEVRIO is an investigational medicine used to treat mild-to-moderate COVID-19 in adults: ? with positive results of direct SARS-CoV-2 viral testing, and ? who are at high risk for progression to severe COVID-19 including hospitalization or death, and for whom other COVID-19 treatment options approved or authorized by the FDA are not accessible or clinically appropriate. The FDA has authorized the emergency use of LAGEVRIO for the treatment of mild-tomoderate COVID-19 in adults under an EUA. For more information on EUA, see the "What is an Emergency Use Authorization (EUA)?" section at the end of this Fact Sheet. LAGEVRIO is not authorized: ? for use in people less than 54 years of age. ? for prevention of COVID-19. ? for people needing hospitalization for COVID-19. ? for use for longer than 5 consecutive days.  What should I tell my healthcare provider before I take LAGEVRIO? Tell your healthcare provider if you: ? Have any allergies ? Are breastfeeding or plan to breastfeed ? Have any serious illnesses ? Are taking any medicines (prescription, over-the-counter, vitamins, or herbal products).  How do I take LAGEVRIO? ? Take LAGEVRIO exactly as your healthcare provider tells you to take it. ? Take 4 capsules of LAGEVRIO every 12 hours (for example, at 8 am and at 8 pm) ? Take LAGEVRIO for 5 days. It is important that you complete the full 5 days of treatment with LAGEVRIO. Do not stop taking LAGEVRIO before you complete the full 5 days of  treatment, even if you feel better. ? Take LAGEVRIO with or without food. ? You should stay in isolation for as long as your healthcare provider tells you to. Talk to your healthcare provider if you are not sure about how to properly isolate while you have COVID-19. ? Swallow LAGEVRIO capsules whole. Do not open, break, or crush the capsules. If you cannot swallow capsules whole, tell your healthcare provider. ? What to do if you miss a dose: o If it has been less than 10 hours since the missed dose, take it as soon as you remember o If it has been more than 10 hours since the missed dose, skip the missed dose and take your dose at the next scheduled time. ? Do not double the dose of LAGEVRIO to make up for a missed dose.  What are the important possible side effects of LAGEVRIO? ? See, "What is the most important information I should know about LAGEVRIO?" ? Allergic Reactions. Allergic reactions can happen in people taking LAGEVRIO, even after only 1 dose. Stop taking LAGEVRIO and call your healthcare provider right away if you get any of the following symptoms  of an allergic reaction: o hives o rapid heartbeat o trouble swallowing or breathing o swelling of the mouth, lips, or face o throat tightness o hoarseness o skin rash The most common side effects of LAGEVRIO are: ? diarrhea ? nausea ? dizziness These are not all the possible side effects of LAGEVRIO. Not many people have taken LAGEVRIO. Serious and unexpected side effects may happen. This medicine is still being studied, so it is possible that all of the risks are not known at this time.  What other treatment choices are there?  Veklury (remdesivir) is FDA-approved as an intravenous (IV) infusion for the treatment of mildto-moderate COVID-19 in certain adults and children. Talk with your doctor to see if Elias Else is appropriate for you. Like LAGEVRIO, FDA may also allow for the emergency use of other medicines to treat  people with COVID-19. Go to http://www.austin-beck.biz/ for more information. It is your choice to be treated or not to be treated with LAGEVRIO. Should you decide not to take it, it will not change your standard medical care.  What if I am breastfeeding? Breastfeeding is not recommended during treatment with LAGEVRIO and for 4 days after the last dose of LAGEVRIO. If you are breastfeeding or plan to breastfeed, talk to your healthcare provider about your options and specific situation before taking LAGEVRIO.  How do I report side effects with LAGEVRIO? Contact your healthcare provider if you have any side effects that bother you or do not go away. Report side effects to FDA MedWatch at MacRetreat.be or call 1-800-FDA-1088 (410-089-5792).  How should I store LAGEVRIO? ? Store LAGEVRIO capsules at room temperature between 28F to 74F (20C to 25C). ? Keep LAGEVRIO and all medicines out of the reach of children and pets. How can I learn more about COVID-19? ? Ask your healthcare provider. ? Visit IndexCrawler.co.za ? Contact your local or state public health department. ? Call [employer] at [phone] (toll free in the [place] ? Visit [email or web address].com  What Is an Emergency Use Authorization (EUA)? The [place] FDA has made LAGEVRIO available under an emergency access mechanism called an Emergency Use Authorization (EUA) The EUA is supported by a Insurance account manager Health and Human Service (HHS) declaration that circumstances exist to justify emergency use of drugs and biological products during the COVID-19 pandemic. LAGEVRIO for the treatment of mild-to-moderate COVID-19 in adults with positive results of direct SARS-CoV-2 viral testing, who are at high risk for progression to severe COVID-19, including hospitalization or death, and for whom alternative COVID-19  treatment options approved or authorized by FDA are not accessible or clinically appropriate, has not undergone the same type of review as an FDA-approved product. In issuing an EUA under the COVID-19 public health emergency, the FDA has determined, among other things, that based on the total amount of scientific evidence available including data from adequate and well-controlled clinical trials, if available, it is reasonable to believe that the product may be effective for diagnosing, treating, or preventing COVID-19, or a serious or life-threatening disease or condition caused by COVID-19; that the known and potential benefits of the product, when used to diagnose, treat, or prevent such disease or condition, outweigh the known and potential risks of such product; and that there are no adequate, approved, and available alternatives.  All of these criteria must be met to allow for the product to be used in the treatment of patients during the COVID-19 pandemic. The EUA for LAGEVRIO is in effect for the duration  of the COVID-19 declaration justifying emergency use of LAGEVRIO, unless terminated or revoked (after which LAGEVRIO may no longer be used under the EUA). For patent information: LeaseGuru.tn Copyright  2021-2022 [employer] [place], [place] and its affiliates. All rights reserved. [id number] Revised: March 2022

## 2022-12-11 NOTE — Progress Notes (Signed)
Virtual Visit via Video Note  I connected with Denise Beard  on 12/11/22 at  1:20 PM EDT by a video enabled telemedicine application and verified that I am speaking with the correct person using two identifiers.  Location patient: [place] Location provider:work or home office Persons participating in the virtual visit: patient, provider  I discussed the limitations and requested verbal permission for telemedicine visit. The patient expressed understanding and agreed to proceed.   HPI:  Acute telemedicine visit for Covid: -Onset: yesterday, tested positive fore covd -Symptoms include: runny nose, cough, HA, body aches, sore throat, chest congestion -Denies:fevers, CP, SOB, NVD -Has tried: musinex -Pertinent past medical history: see below, has had mild covid in the past, GFR 92 in June -Pertinent medication allergies: Allergies  Allergen Reactions   Iodine     Per the patient. Had an oral solution in the 90's that gave her a bad reaction of hallucinations.    -COVID-19 vaccine status: has not had a covid booster Immunization History  Administered Date(s) Administered   Influenza Whole 02/19/2011   Influenza,inj,Quad PF,6+ Mos 05/24/2017, 04/05/2018, 04/23/2019, 03/10/2021, 01/30/2022   Influenza-Unspecified 02/09/2015, 01/24/2016, 04/30/2018   Moderna SARS-COV2 Booster Vaccination 03/15/2020   Moderna Sars-Covid-2 Vaccination 07/30/2019, 09/01/2019   Pneumococcal Conjugate-13 11/10/2014   Pneumococcal Polysaccharide-23 10/23/2019   Td 09/02/2007   Tdap 10/17/2018   Zoster Recombinant(Shingrix) 11/13/2021, 01/30/2022     ROS: See pertinent positives and negatives per HPI.  Past Medical History:  Diagnosis Date   ANXIETY 03/17/2007   in past   ASTHMA 06/26/2007   DIAB W/O COMP TYPE II/UNS NOT STATED UNCNTRL 04/06/2009   GERD 06/26/2007   Irritable bowel syndrome 02/27/2010   constipation   OA (osteoarthritis) 12/13/2009   SEIZURE DISORDER 04/17/2010   in past /over 4 years ago/no  meds. actually vasovagal per neuro notes    Past Surgical History:  Procedure Laterality Date   ABDOMINAL HYSTERECTOMY     CESAREAN SECTION     TUBAL LIGATION       Current Outpatient Medications:    albuterol (VENTOLIN HFA) 108 (90 Base) MCG/ACT inhaler, Inhale 2 puffs into the lungs every 6 (six) hours as needed for wheezing or shortness of breath., Disp: 1 each, Rfl: 2   azelastine (ASTELIN) 0.1 % nasal spray, Place 2 sprays into both nostrils 2 (two) times daily. (Patient taking differently: Place 2 sprays into both nostrils as needed.), Disp: 30 mL, Rfl: 12   BD PEN NEEDLE NANO 2ND GEN 32G X 4 MM MISC, USE ONCE DAILY AS DIRECTED, Disp: 100 each, Rfl: 0   benzonatate (TESSALON PERLES) 100 MG capsule, 1-2 capsules up to twice daily as needed for cough., Disp: 30 capsule, Rfl: 0   budesonide-formoterol (SYMBICORT) 160-4.5 MCG/ACT inhaler, Inhale 2 puffs into the lungs 2 (two) times daily., Disp: 1 each, Rfl: 11   gabapentin (NEURONTIN) 300 MG capsule, Take 1 capsule (300 mg total) by mouth 3 (three) times daily as needed., Disp: 90 capsule, Rfl: 3   insulin glargine (LANTUS SOLOSTAR) 100 UNIT/ML Solostar Pen, INJECT 35 UNITS INTO THE SKIN DAILY AT 10 PM., Disp: 30 mL, Rfl: 1   insulin lispro (HUMALOG KWIKPEN) 100 UNIT/ML KwikPen, INJECT 8-14 UNITS INTO THE SKIN 3 TIMES A DAY BEFORE MEALS, Disp: 30 mL, Rfl: 1   metFORMIN (GLUCOPHAGE) 1000 MG tablet, Take 2 tablets (2,000 mg total) by mouth daily with supper., Disp: 180 tablet, Rfl: 3   molnupiravir EUA (LAGEVRIO) 200 mg CAPS capsule, Take 4 capsules (800 mg total)  by mouth 2 (two) times daily for 5 days., Disp: 40 capsule, Rfl: 0   Semaglutide, 1 MG/DOSE, (OZEMPIC, 1 MG/DOSE,) 4 MG/3ML SOPN, INJECT 1 MG INTO THE SKIN ONE TIME PER WEEK, Disp: 9 mL, Rfl: 1   simvastatin (ZOCOR) 20 MG tablet, TAKE 1 TABLET BY MOUTH EVERYDAY AT BEDTIME, Disp: 90 tablet, Rfl: 3   zolpidem (AMBIEN) 10 MG tablet, TAKE 1 TABLET BY MOUTH AT BEDTIME AS NEEDED FOR  SLEEP, Disp: 30 tablet, Rfl: 5  EXAM:  VITALS per patient if applicable: Vitals:   12/11/22 1220  Temp: (!) 97.2 F (36.2 C)     GENERAL: alert, oriented, appears well and in no acute distress  HEENT: atraumatic, conjunttiva clear, no obvious abnormalities on inspection of external nose and ears  NECK: normal movements of the head and neck  LUNGS: on inspection no signs of respiratory distress, breathing rate appears normal, no obvious gross SOB, gasping or wheezing  CV: no obvious cyanosis  MS: moves all visible extremities without noticeable abnormality  PSYCH/NEURO: pleasant and cooperative, no obvious depression or anxiety, speech and thought processing grossly intact  ASSESSMENT AND PLAN:  Discussed the following assessment and plan:  COVID-19   Discussed treatment options, side effect and risk of drug interactions, ideal treatment window, potential complications, isolation and precautions for COVID-19.  Checked for/reviewed last GFR - listed in HPI if available.  After lengthy discussion, the patient opted for treatment with Legevrio due to being higher risk for complications of covid or severe disease and other factors. Discussed risks, alternatives, precautions. The patient did want a prescription for cough, Tessalon Rx sent.  Other symptomatic care measures summarized in patient instructions. Work/School slipped offered: provided in patient instructions   Advised to seek prompt virtual visit or in person care if worsening, new symptoms arise, or if is not improving with treatment as expected per our conversation of expected course. Discussed options for follow up care. Did let this patient know that I do telemedicine on Tuesdays and Thursdays for [facility] and those are the days I am logged into the system. Advised to schedule follow up visit with PCP, [place] virtual visits or [facility] if any further questions or concerns to avoid delays in care.   I discussed the  assessment and treatment plan with the patient. The patient was provided an opportunity to ask questions and all were answered. The patient agreed with the plan and demonstrated an understanding of the instructions.     Terressa Koyanagi, DO

## 2023-01-11 ENCOUNTER — Encounter: Payer: Self-pay | Admitting: Internal Medicine

## 2023-01-11 ENCOUNTER — Ambulatory Visit: Payer: Commercial Managed Care - PPO | Admitting: Internal Medicine

## 2023-01-11 VITALS — BP 128/70 | HR 88 | Ht 65.0 in | Wt 158.0 lb

## 2023-01-11 DIAGNOSIS — E785 Hyperlipidemia, unspecified: Secondary | ICD-10-CM | POA: Diagnosis not present

## 2023-01-11 DIAGNOSIS — E119 Type 2 diabetes mellitus without complications: Secondary | ICD-10-CM | POA: Diagnosis not present

## 2023-01-11 DIAGNOSIS — Z7985 Long-term (current) use of injectable non-insulin antidiabetic drugs: Secondary | ICD-10-CM | POA: Diagnosis not present

## 2023-01-11 DIAGNOSIS — Z7984 Long term (current) use of oral hypoglycemic drugs: Secondary | ICD-10-CM

## 2023-01-11 DIAGNOSIS — E663 Overweight: Secondary | ICD-10-CM | POA: Diagnosis not present

## 2023-01-11 DIAGNOSIS — Z794 Long term (current) use of insulin: Secondary | ICD-10-CM

## 2023-01-11 LAB — HEMOGLOBIN A1C: Hemoglobin A1C: 8.1

## 2023-01-11 MED ORDER — FREESTYLE LIBRE 3 SENSOR MISC: 1.0000 | 3 refills | Status: DC

## 2023-01-11 NOTE — Patient Instructions (Signed)
Please continue: - Metformin 2000 mg with dinner - Ozempic 1 mg weekly - Lantus 35 units after dinner  - Humalog  8-12 units 15 minutes before b'fast 8-12 units 15 minutes before lunch 8-12 units 15 minutes before dinner  Please return in 4-6 months with your sugar log.

## 2023-01-11 NOTE — Progress Notes (Signed)
Subjective:     Patient ID: Denise Beard, female   DOB: December 26, 1960, 62 y.o.   MRN: [id number]  Diabetes Ms. Chelton is a pleasant 62 y.o. woman, returning for f/u for DM2, dx 2012, insulin-dependent, uncontrolled, without long-term complications. Last visit 4 months ago.  Interim history: No increased urination, nausea, blurry vision. She has constipation from Ozempic >> improved. Before last visit she did Daniel fast x 1 mo and also a 3-day fast.  She lost 9 pounds since last visit and sugars are better. She had a URI in 08/2022, then Covid >> did not recover completely: sore throat, pressure in chest.  She is planning to go to Urgent care over the weekend.  Reviewed HbA1c levels: Lab Results  Component Value Date   HGBA1C 7.1 (A) 07/13/2022   HGBA1C 7.7 (A) 03/09/2022   HGBA1C 7.5 (H) 11/13/2021   HGBA1C 7.0 (A) 07/14/2021   HGBA1C 7.6 (A) 03/10/2021   HGBA1C 7.8 (A) 09/09/2020   HGBA1C 10.2 (A) 02/26/2020   HGBA1C 10.2 (A) 08/28/2019   HGBA1C 7.3 (A) 04/23/2019   HGBA1C 7.6 (H) 10/17/2018   HGBA1C 6.8 (A) 05/02/2018   HGBA1C 7.6 (A) 01/24/2018   HGBA1C 6.5 09/20/2017   HGBA1C 7.0 (H) 05/24/2017   HGBA1C 6.9 05/17/2017   HGBA1C 7.0 11/30/2016   HGBA1C 7.3 08/10/2016   HGBA1C 7.3 (H) 04/06/2016   HGBA1C 6.9 12/16/2015   HGBA1C 7.2 09/16/2015   HGBA1C 7.0 04/22/2015   HGBA1C 7.0 01/14/2015   HGBA1C 7.6 (H) 10/15/2014   HGBA1C 8.5 (H) 07/08/2014   HGBA1C 6.7 (H) 02/26/2014   HGBA1C 7.0 (H) 10/30/2013   HGBA1C 7.5 (H) 07/17/2013   HGBA1C 7.3 (H) 02/06/2013   HGBA1C 6.9 (H) 10/06/2012   HGBA1C 8.1 (H) 06/30/2012   She is on: - Metformin 1000 mg 2x a day with meals >> 2000 mg with dinner - Trulicity 1.5 >> 3 mg weekly  >> Ozempic 1 mg weekly - Lantus 30 >> 40 >> 35 >> 40 >> 35 units after dinner - misses doses - Humalog: 8-12 units 15 minutes before b'fast 8-12 units 15 minutes before lunch 8-12 units 15 minutes before dinner We stopped glipizide due to  dizziness. She was on saxagliptin/metformin XR 09/998 mg (Kombyglyze) in the past. She was on JanuMet 50/1000 bid in 2015 >> lightheaded, HA, blurry vision.  She is checking sugars 1-2 times a day: - am:  73-263, 405 >> 95-184, 225 >> 89-142, 272 >> 80s, 94-174 >> 190s - 2h after b'fast:   111-262, 382 >> 148-228, 244 >> n/c - before lunch:  55, 104, 148 >> 99-321 >> 93-198 >> 63 >> 106-143 >> n/c - 2h after lunch:  78, 100-179 >> 160-300  >> 84-182, 232 >> 77 >> n/c - before dinner: 100-145, 211, 224 >> n/c >> 80-109, 184 >> 103 >> n/c - 2h after dinner:138 >> 160-190 >> 227, 259 >> 89-114 >> n/c - bedtime:  n/c >> 216-381 >> 199  >> n/c >> 98 >> n/c - nighttime: 109-188 >> n/c >> 125-170 >> n/c Lowest: 55 >> 73 >> 80 >> 63 >> 80s >>  Highest: 243 >> 500s >> 244 >> 272 >> 174 >> n/c  Meter:  One Touch ultra 2  She saw nutrition in the past.  No CKD; latest BUN/creatinine: Lab Results  Component Value Date   BUN 8 11/15/2022   CREATININE 0.71 11/15/2022   No MAU: Lab Results  Component Value Date   MICRALBCREAT 0.6  11/15/2022   MICRALBCREAT 1.1 11/13/2021   MICRALBCREAT 0.8 11/09/2020   MICRALBCREAT 1.1 10/23/2019   MICRALBCREAT 8 05/27/2018   MICRALBCREAT 0.9 05/24/2017   MICRALBCREAT 0.8 04/06/2016   MICRALBCREAT 2.4 02/04/2015   MICRALBCREAT 0.4 10/30/2013   MICRALBCREAT 18.2 07/17/2013   + HL; lipids:  Lab Results  Component Value Date   CHOL 178 11/15/2022   HDL 41.30 11/15/2022   LDLCALC 77 11/09/2020   LDLDIRECT 107.0 11/15/2022   TRIG 228.0 (H) 11/15/2022   CHOLHDL 4 11/15/2022  On Zocor 20.  - Last eye exam 06/01/2022: No DR  (Dr Fredrich Birks [facility]).    - No numbness or tingling in her feet.  Last foot exam: Dr. Durene Cal 11/15/2022.  She also has a history of anxiety, asthma, GERD, constipation; polyarthropathy; seizure disorder.   Review of Systems + See HPI  I reviewed pt's medications, allergies, PMH, social hx, family hx, and  changes were documented in the history of present illness. Otherwise, unchanged from my initial visit note.  Past Medical History:  Diagnosis Date   ANXIETY 03/17/2007   in past   ASTHMA 06/26/2007   DIAB W/O COMP TYPE II/UNS NOT STATED UNCNTRL 04/06/2009   GERD 06/26/2007   Irritable bowel syndrome 02/27/2010   constipation   OA (osteoarthritis) 12/13/2009   SEIZURE DISORDER 04/17/2010   in past /over 4 years ago/no meds. actually vasovagal per neuro notes   Past Surgical History:  Procedure Laterality Date   ABDOMINAL HYSTERECTOMY     CESAREAN SECTION     TUBAL LIGATION     Social History   Socioeconomic History   Marital status: Divorced    Spouse name: Not on file   Number of children: Not on file   Years of education: Not on file   Highest education level: Not on file  Occupational History   Not on file  Tobacco Use   Smoking status: Never   Smokeless tobacco: Never  Substance and Sexual Activity   Alcohol use: No   Drug use: No   Sexual activity: Not Currently    Birth control/protection: Surgical  Other Topics Concern   Not on file  Social History Narrative   Divorced/Single. 2 chldren. 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 1 each 2   azelastine (ASTELIN) 0.1 % nasal spray Place 2 sprays into  both nostrils 2 (two) times daily. (Patient taking differently: Place 2 sprays into both nostrils as needed.) 30 mL 12   BD PEN NEEDLE NANO 2ND GEN 32G X 4 MM MISC USE ONCE DAILY AS DIRECTED 100 each 0   benzonatate (TESSALON PERLES) 100 MG capsule 1-2 capsules up to twice daily as needed for cough. 30 capsule 0  budesonide-formoterol (SYMBICORT) 160-4.5 MCG/ACT inhaler Inhale 2 puffs into the lungs 2 (two) times daily. 1 each 11   gabapentin (NEURONTIN) 300 MG capsule Take 1 capsule (300 mg total) by mouth 3 (three) times daily as needed. 90 capsule 3   insulin glargine (LANTUS SOLOSTAR) 100 UNIT/ML Solostar Pen INJECT 35 UNITS INTO THE SKIN DAILY AT 10 PM. 30 mL 1   insulin lispro (HUMALOG KWIKPEN) 100 UNIT/ML KwikPen INJECT 8-14 UNITS INTO THE SKIN 3 TIMES A DAY BEFORE MEALS 30 mL 1   metFORMIN (GLUCOPHAGE) 1000 MG tablet Take 2 tablets (2,000 mg total) by mouth daily with supper. 180 tablet 3   Semaglutide, 1 MG/DOSE, (OZEMPIC, 1 MG/DOSE,) 4 MG/3ML SOPN INJECT 1 MG INTO THE SKIN ONE TIME PER WEEK 9 mL 1   simvastatin (ZOCOR) 20 MG tablet TAKE 1 TABLET BY MOUTH EVERYDAY AT BEDTIME 90 tablet 3   zolpidem (AMBIEN) 10 MG tablet TAKE 1 TABLET BY MOUTH AT BEDTIME AS NEEDED FOR SLEEP 30 tablet 5   No current facility-administered medications on file prior to visit.   Allergies  Allergen Reactions   Iodine     Per the patient. Had an oral solution in the 90's that gave her a bad reaction of hallucinations.    Family History  Problem Relation Age of Onset   Thyroid disease Mother    Hyperlipidemia Mother    Breast cancer Mother 51   Arthritis Mother    Cancer Mother    Hypertension Mother    Skin cancer Father        not melanoma- passed at 67   Colon cancer Maternal Uncle    Stomach cancer Maternal Uncle    Lung cancer Maternal Uncle    Cancer Maternal Grandfather        unknown   Diabetes Maternal Grandfather     Objective:   Physical Exam  BP 128/70   Pulse 88   Ht 5\' 5"   (1.651 m)   Wt 158 lb (71.7 kg)   SpO2 97%   BMI 26.29 kg/m    Wt Readings from Last 3 Encounters:  01/11/23 158 lb (71.7 kg)  12/11/22 159 lb (72.1 kg)  11/15/22 155 lb 9.6 oz (70.6 kg)   Constitutional: overweight, in NAD Eyes:  EOMI, no exophthalmos ENT: no neck masses, no cervical lymphadenopathy Cardiovascular: RRR, No MRG Respiratory: CTA B Musculoskeletal: no deformities Skin:no rashes Neurological: no tremor with outstretched hands  Assessment:     1. DM2, insulin-dependent, uncontrolled, without long term complications, but with hyperglycemia  Type 1 diabetes investigation was negative: Component     Latest Ref Rng & Units 01/24/2018  ZNT8 Antibodies     U/mL <15  Islet Cell Ab     Neg:<1:1 Negative  Glutamic Acid Decarb Ab     <5 IU/mL <5  C-Peptide     0.80 - 3.85 ng/mL 2.07  Glucose, Plasma     65 - 99 mg/dL 619 (H)   2. HL  3.  Overweight    Plan:     Pt with longstanding, uncontrolled, type 2 diabetes, on metformin, basal-bolus insulin regimen and weekly GLP-1 receptor agonist, with still poor control. -At last visit, sugars were mostly at goal but mostly checking in the morning.  We discussed about the importance of checking later in the day.  At today's visit, sugars are high in the morning, she mentions that they are around 190s, and she is not checking later in the day.  She  feels poorly, after repeated upper respiratory infections and she mentions that she did not check blood sugars consistently and missing many doses of the long-acting insulin and also the short acting insulin as she was not eating very well. -At today's visit we discussed about trying to take her insulin consistently, and also do Humalog before meals.  I again recommended a CGM.  She declined one in the past but as of now she agrees to try it if covered by the insurance.  I sent the prescription for the freestyle libre CGM to her pharmacy. - I advised her to: Patient Instructions   Please continue: - Metformin 2000 mg with dinner - Ozempic 1 mg weekly - Lantus 35 units after dinner  - Humalog  8-12 units 15 minutes before b'fast 8-12 units 15 minutes before lunch 8-12 units 15 minutes before dinner  Please return in 4-6 months with your sugar log.  - we checked her HbA1c: 8.1% (higher) - advised to check sugars at different times of the day - 4x a day, rotating check times - advised for yearly eye exams >> she is UTD - return to clinic in  6 months - per her preference, due to the high co-pay for the visits  2. HL  -Latest lipid panel from 10/2022: LDL higher than target, triglycerides also elevated: Lab Results  Component Value Date   CHOL 178 11/15/2022   HDL 41.30 11/15/2022   LDLCALC 77 11/09/2020   LDLDIRECT 107.0 11/15/2022   TRIG 228.0 (H) 11/15/2022   CHOLHDL 4 11/15/2022  -She  continues Zocor 20 mg daily without side effects  3.  Overweight -She is on Ozempic 1 mg weekly, which should also help with weight loss -She lost 9 pounds before last visit while doing fast for church and also a 3-day water fast. -She gained 5 pounds afterwards  Carlus Pavlov, MD PhD [facility]

## 2023-01-14 ENCOUNTER — Ambulatory Visit: Payer: Commercial Managed Care - PPO | Admitting: Internal Medicine

## 2023-01-14 ENCOUNTER — Encounter: Payer: Self-pay | Admitting: Internal Medicine

## 2023-01-14 VITALS — BP 132/93 | HR 84 | Temp 98.0°F | Ht 65.0 in | Wt 159.0 lb

## 2023-01-14 DIAGNOSIS — U099 Post covid-19 condition, unspecified: Secondary | ICD-10-CM

## 2023-01-14 DIAGNOSIS — J020 Streptococcal pharyngitis: Secondary | ICD-10-CM | POA: Diagnosis not present

## 2023-01-14 DIAGNOSIS — J40 Bronchitis, not specified as acute or chronic: Secondary | ICD-10-CM

## 2023-01-14 DIAGNOSIS — J029 Acute pharyngitis, unspecified: Secondary | ICD-10-CM

## 2023-01-14 LAB — POCT RAPID STREP A (OFFICE): Rapid Strep A Screen: POSITIVE — AB

## 2023-01-14 MED ORDER — AMOXICILLIN 500 MG PO CAPS: 500.0000 mg | ORAL_CAPSULE | Freq: Three times a day (TID) | ORAL | 0 refills | Status: AC

## 2023-01-14 MED ORDER — PROMETHAZINE-DM 6.25-15 MG/5ML PO SYRP: 5.0000 mL | ORAL_SOLUTION | Freq: Four times a day (QID) | ORAL | 0 refills | Status: AC | PRN

## 2023-01-14 NOTE — Progress Notes (Signed)
[facility]: [phone]   -- Medical Office Visit --  Patient:  Denise Beard      Age: 62 y.o.       Sex:  female  Date:   01/14/2023 Patient Care Team: Shelva Majestic, MD as PCP - General (Family Medicine) Carlus Pavlov, MD as Consulting Physician (Internal Medicine) Today's Healthcare Provider: Lula Olszewski, MD Assessment & Plan Sore throat Initial main concern(s) ... I think more due to cough than strep throat. Long COVID They have a history of COVID-19 infection starting on July 22nd, with persistent fatigue and shortness of breath post-infection. We will continue monitoring their symptoms. Strep pharyngitis Positive strep swab.... but her throat doesn't look bad and she doesn't meet centor criteria. Will treat due to chronicity of symptom(s) and possibility of secondary bacterial infection(s). Bronchitis They exhibit persistent cough, sore throat, and shortness of breath weeks following a COVID-19 infection, with a positive test for strep throat and a possible sinus infection contributing to their symptoms. Auscultation reveals clear lung sounds. We will prescribe Amoxicillin for 10 days to address the strep throat and possible sinus infection. They are advised to take probiotics during the second half of the antibiotic course to prevent C. difficileitis and vaginal yeast infection. For symptomatic relief, Phenergan DM cough syrup will be prescribed.A chest x-ray was discussed to rule out pneumonia due to their persistent cough and shortness of breath. We offer the option to order a chest x-ray if symptoms do not improve after the antibiotic course. They should contact via MyChart if needed. It is suggested they set up an appointment with Dr. Durene Cal for further evaluation if symptoms persist or worsen.    Diagnoses and all orders for this visit: Sore throat -     POCT rapid strep A Long COVID Strep pharyngitis Bronchitis Other orders -      amoxicillin (AMOXIL) 500 MG capsule; Take 1 capsule (500 mg total) by mouth 3 (three) times daily for 10 days. -     promethazine-dextromethorphan (PROMETHAZINE-DM) 6.25-15 MG/5ML syrup; Take 5 mLs by mouth 4 (four) times daily as needed for up to 7 days for cough.   @Recommended  follow up: 1 week with Primary Care Provider (PCP)  Future Appointments  Date Time Provider Department Center  07/18/2023  4:00 PM Carlus Pavlov, MD [facility] None  11/15/2023  9:00 AM Shelva Majestic, MD [facility]           Subjective   62 y.o. female who has Type II diabetes mellitus, well controlled (HCC); Asthma; GERD; Irritable bowel syndrome; Osteoarthritis; Vasovagal syncope; Hyperlipidemia associated with type 2 diabetes mellitus (HCC); Allergic rhinitis; Insomnia; and Melanocytic nevi of trunk on their problem list. Her reasons/main concerns/chief complaints for today's office visit are Sore Throat, Chest Pain (Chest pressure), and Fatigue   ------------------------------------------------------------------------------------------------------------------------ AI-Extracted: Discussed the use of AI scribe software for clinical note transcription with the patient, who gave verbal consent to proceed.  History of Present Illness   The patient, with a history of allergic rhinitis, asthma, heartburn, hypercholesterolemia, poor sleep, irritable bowel syndrome, arthritis, and well-controlled diabetes, presents with a persistent cough and chest discomfort. The patient also reports a history of syncope. The patient's primary concern is a non-productive cough and a sensation of pressure in the chest, extending from the mid-sternum to the throat. This discomfort has been present for a couple of weeks, persisting even after a recent COVID-19 infection. The patient tested positive for COVID-19 over a month ago  and has since tested negative, but reports that the symptoms never fully resolved.  The patient describes  the cough as dry, with no expectoration, and associated with a feeling of tightness in the chest. The patient also reports shortness of breath, which has been managed with a metered-dose inhaler. The inhaler reportedly helps with the shortness of breath but not the cough. The patient also reports persistent fatigue since the onset of the COVID-19 infection.  The patient has been using an albuterol inhaler and Tessalon pearls for the cough, but reports that these medications have not been effective. The patient also reports a sore throat, which has been present for a couple of weeks. The patient denies any fever or wheezing.  The patient's symptoms have led to concerns about possible pneumonia, but the patient also acknowledges that the symptoms could be a lingering effect of the recent COVID-19 infection. The patient denies any recent changes in medication and has no known allergies.       ------------------------------------------------------------------------------------------------------------------------ She has a past medical history of ANXIETY (03/17/2007), ASTHMA (06/26/2007), DIAB W/O COMP TYPE II/UNS NOT STATED UNCNTRL (04/06/2009), GERD (06/26/2007), Irritable bowel syndrome (02/27/2010), OA (osteoarthritis) (12/13/2009), and SEIZURE DISORDER (04/17/2010).  Problem list overviews that were updated at today's visit: No problems updated. Current Outpatient Medications on File Prior to Visit  Medication Sig   albuterol (VENTOLIN HFA) 108 (90 Base) MCG/ACT inhaler Inhale 2 puffs into the lungs every 6 (six) hours as needed for wheezing or shortness of breath.   azelastine (ASTELIN) 0.1 % nasal spray Place 2 sprays into both nostrils 2 (two) times daily. (Patient taking differently: Place 2 sprays into both nostrils as needed.)   BD PEN NEEDLE NANO 2ND GEN 32G X 4 MM MISC USE ONCE DAILY AS DIRECTED   benzonatate (TESSALON PERLES) 100 MG capsule 1-2 capsules up to twice daily as needed for cough.    budesonide-formoterol (SYMBICORT) 160-4.5 MCG/ACT inhaler Inhale 2 puffs into the lungs 2 (two) times daily.   Continuous Glucose Sensor (FREESTYLE LIBRE 3 SENSOR) MISC 1 each by Does not apply route every 14 (fourteen) days.   gabapentin (NEURONTIN) 300 MG capsule Take 1 capsule (300 mg total) by mouth 3 (three) times daily as needed.   insulin glargine (LANTUS SOLOSTAR) 100 UNIT/ML Solostar Pen INJECT 35 UNITS INTO THE SKIN DAILY AT 10 PM.   insulin lispro (HUMALOG KWIKPEN) 100 UNIT/ML KwikPen INJECT 8-14 UNITS INTO THE SKIN 3 TIMES A DAY BEFORE MEALS   metFORMIN (GLUCOPHAGE) 1000 MG tablet Take 2 tablets (2,000 mg total) by mouth daily with supper.   Semaglutide, 1 MG/DOSE, (OZEMPIC, 1 MG/DOSE,) 4 MG/3ML SOPN INJECT 1 MG INTO THE SKIN ONE TIME PER WEEK   simvastatin (ZOCOR) 20 MG tablet TAKE 1 TABLET BY MOUTH EVERYDAY AT BEDTIME   zolpidem (AMBIEN) 10 MG tablet TAKE 1 TABLET BY MOUTH AT BEDTIME AS NEEDED FOR SLEEP   No current facility-administered medications on file prior to visit.  There are no discontinued medications.      Objective    Physical Exam  BP (!) 132/93 (BP Location: Left Arm, Patient Position: Sitting)   Pulse 84   Temp 98 F (36.7 C) (Temporal)   Ht 5\' 5"  (1.651 m)   Wt 159 lb (72.1 kg)   SpO2 99%   BMI 26.46 kg/m  Wt Readings from Last 10 Encounters:  01/14/23 159 lb (72.1 kg)  01/11/23 158 lb (71.7 kg)  12/11/22 159 lb (72.1 kg)  11/15/22 155 lb 9.6 oz (  70.6 kg)  07/13/22 155 lb 3.2 oz (70.4 kg)  06/22/22 152 lb 9.6 oz (69.2 kg)  05/30/22 160 lb 2 oz (72.6 kg)  03/09/22 164 lb 9.6 oz (74.7 kg)  12/11/21 163 lb (73.9 kg)  12/01/21 160 lb 6.4 oz (72.8 kg)   Vital signs reviewed.  Nursing notes reviewed. Weight trend reviewed. Abnormalities and Problem-Specific physical exam findings:  oropharynx clear, lungs clear to auscultation bilaterally heart sounds well. No dehydration. She doesn't cough during the appointment or clear her throat  General  Appearance:  No acute distress appreciable.   Well-groomed, healthy-appearing female.  Well proportioned with no abnormal fat distribution.  Good muscle tone. Pulmonary:  Normal work of breathing at rest, no respiratory distress apparent. SpO2: 99 %  Musculoskeletal: All extremities are intact.  Neurological:  Awake, alert, oriented, and engaged.  No obvious focal neurological deficits or cognitive impairments.  Sensorium seems unclouded.   Speech is clear and coherent with logical content. Psychiatric:  Appropriate mood, pleasant and cooperative demeanor, thoughtful and engaged during the exam  Results   LABS Strep throat swab: Positive (01/14/2023) Flu swab: Negative (01/14/2023)        Results for orders placed or performed in visit on 01/14/23  POCT rapid strep A  Result Value Ref Range   Rapid Strep A Screen Positive (A) Negative    Office Visit on 01/14/2023  Component Date Value   Rapid Strep A Screen 01/14/2023 Positive (A)   Office Visit on 11/15/2022  Component Date Value   Microalb, Ur 11/15/2022 <0.7    Creatinine,U 11/15/2022 109.2    Microalb Creat Ratio 11/15/2022 0.6    Sodium 11/15/2022 140    Potassium 11/15/2022 5.1    Chloride 11/15/2022 105    CO2 11/15/2022 25    Glucose, Bld 11/15/2022 165 (H)    BUN 11/15/2022 8    Creatinine, Ser 11/15/2022 0.71    Total Bilirubin 11/15/2022 0.5    Alkaline Phosphatase 11/15/2022 88    AST 11/15/2022 19    ALT 11/15/2022 13    Total Protein 11/15/2022 7.4    Albumin 11/15/2022 4.3    GFR 11/15/2022 92.39    Calcium 11/15/2022 10.2    WBC 11/15/2022 7.6    RBC 11/15/2022 4.31    Hemoglobin 11/15/2022 12.7    HCT 11/15/2022 39.7    MCV 11/15/2022 92.1    MCHC 11/15/2022 32.0    RDW 11/15/2022 12.8    Platelets 11/15/2022 282.0    Neutrophils Relative % 11/15/2022 46.6    Lymphocytes Relative 11/15/2022 44.0    Monocytes Relative 11/15/2022 7.6    Eosinophils Relative 11/15/2022 1.5    Basophils Relative  11/15/2022 0.3    Neutro Abs 11/15/2022 3.5    Lymphs Abs 11/15/2022 3.3    Monocytes Absolute 11/15/2022 0.6    Eosinophils Absolute 11/15/2022 0.1    Basophils Absolute 11/15/2022 0.0    Cholesterol 11/15/2022 178    Triglycerides 11/15/2022 228.0 (H)    HDL 11/15/2022 41.30    VLDL 11/15/2022 45.6 (H)    Total CHOL/HDL Ratio 11/15/2022 4    NonHDL 11/15/2022 136.20    Direct LDL 11/15/2022 107.0   Office Visit on 07/13/2022  Component Date Value   Hemoglobin A1C 07/13/2022 7.1 (A)   Abstract on 06/04/2022  Component Date Value   HM Diabetic Eye Exam 06/01/2022 No Retinopathy   Office Visit on 03/09/2022  Component Date Value   Hemoglobin A1C 03/09/2022 7.7 (A)    HM  Diabetic Foot Exam 11/13/2021 done    No image results found.   Results for orders placed or performed in visit on 01/14/23  POCT rapid strep A  Result Value Ref Range   Rapid Strep A Screen Positive (A) Negative         Additional Notes:   This encounter employed real-time, collaborative documentation. The patient actively reviewed and updated their medical record on a shared screen, ensuring transparency and facilitating joint problem-solving for the problem list, overview, and plan. This approach promotes accurate, informed care. The treatment plan was discussed and reviewed in detail, including medication safety, potential side effects, and all patient questions. We confirmed understanding and comfort with the plan. Follow-up instructions were established, including contacting the office for any concerns, returning if symptoms worsen, persist, or new symptoms develop, and precautions for potential emergency department visits. ----------------------------------------------------- Lula Olszewski, MD  01/14/2023 8:16 PM  [facility]:  [phone]

## 2023-01-14 NOTE — Patient Instructions (Addendum)
It was a pleasure seeing you today! Your health and satisfaction are our top priorities.  Glenetta Hew, MD  VISIT SUMMARY:  During your visit, we discussed your persistent cough, chest discomfort, and fatigue, which have been ongoing since your recent COVID-19 infection. We also addressed your concerns about possible pneumonia. After examining you and considering your symptoms, we believe you may have an upper respiratory infection, possibly a sinus infection, and strep throat. We will continue to monitor your symptoms related to your past COVID-19 infection.  YOUR PLAN:  -UPPER RESPIRATORY INFECTION: You have symptoms of an upper respiratory infection, which is an infection in your nose, throat, or sinuses. We will treat this with a 10-day course of Amoxicillin, an antibiotic. To prevent side effects from the antibiotic, you should take probiotics during the second half of the course. We will also prescribe Phenergan DM cough syrup to help with your cough.  -COVID-19: You had a COVID-19 infection in July and are still experiencing fatigue and shortness of breath. We will continue to monitor these symptoms.  -CHEST X-RAY: We discussed the possibility of getting a chest x-ray to check for pneumonia, which is an infection in your lungs. If your symptoms do not improve after the antibiotic course, we can order this test. You can request it through MyChart.  INSTRUCTIONS:  Please take the prescribed medications as directed. If your symptoms do not improve or if they worsen, please set up an appointment with Dr. Durene Cal for further evaluation. You can do this through MyChart.  NEXT STEPS: [x]  Early Intervention: Schedule sooner appointment, call our on-call services, or go to emergency room if there is any significant Increase in pain or discomfort New or worsening symptoms Sudden or severe changes in your health [x]  Flexible Follow-Up: We recommend a No follow-ups on file. for optimal routine  care. This allows for progress monitoring and treatment adjustments. [x]  Preventive Care: Schedule your annual preventive care visit! It's typically covered by insurance and helps identify potential health issues early. [x]  Lab & X-ray Appointments: Incomplete tests scheduled today, or call to schedule. X-rays: [facility] (M-F, 8:30am-noon or 1pm-5pm). [x]  Medical Information Release: Sign a release form at front desk to obtain relevant medical information we don't have.  MAKING THE MOST OF OUR FOCUSED 20 MINUTE APPOINTMENTS: [x]   Clearly state your top concerns at the beginning of the visit to focus our discussion [x]   If you anticipate you will need more time, please inform the front desk during scheduling - we can book multiple appointments in the same week. [x]   If you have transportation problems- use our convenient video appointments or ask about transportation support. [x]   We can get down to business faster if you use MyChart to update information before the visit and submit non-urgent questions before your visit. Thank you for taking the time to provide details through MyChart.  Let our nurse know and she can import this information into your encounter documents.  Arrival and Wait Times: [x]   Arriving on time ensures that everyone receives prompt attention. [x]   Early morning (8a) and afternoon (1p) appointments tend to have shortest wait times. [x]   Unfortunately, we cannot delay appointments for late arrivals or hold slots during phone calls.  Getting Answers and Following Up [x]   Simple Questions & Concerns: For quick questions or basic follow-up after your visit, reach [place] at [phone] or MyChart messaging. [x]   Complex Concerns: If your concern is more complex, scheduling an appointment might  be best. Discuss this with the staff to find the most suitable option. [x]   Lab & Imaging Results: We'll contact you directly if results are abnormal or you don't use  MyChart. Most normal results will be on MyChart within 2-3 business days, with a review message from Dr. Jon Billings. Haven't heard back in 2 weeks? Need results sooner? Contact us at [phone]. [x]   Referrals: Our referral coordinator will manage specialist referrals. The specialist's office should contact you within 2 weeks to schedule an appointment. Call us if you haven't heard from them after 2 weeks.  Staying Connected [x]   MyChart: Activate your MyChart for the fastest way to access results and message [place]. See the last page of this paperwork for instructions on how to activate.  Bring to Your Next Appointment [x]   Medications: Please bring all your medication bottles to your next appointment to ensure we have an accurate record of your prescriptions. [x]   Health Diaries: If you're monitoring any health conditions at home, keeping a diary of your readings can be very helpful for discussions at your next appointment.  Billing [x]   X-ray & Lab Orders: These are billed by separate companies. Contact the invoicing company directly for questions or concerns. [x]   Visit Charges: Discuss any billing inquiries with our administrative services team.  Your Satisfaction Matters [x]   Share Your Experience: We strive for your satisfaction! If you have any complaints, or preferably compliments, please let Dr. Jon Billings know directly or contact our Practice Administrators, Edwena Felty or [employer], by asking at the front desk.   Reviewing Your Records [x]   Review this early draft of your clinical encounter notes below and the final encounter summary tomorrow on MyChart after its been completed.  All orders placed so far are visible here: Sore throat -     POCT rapid strep A  Long COVID  Strep pharyngitis  Bronchitis  Other orders -     Amoxicillin; Take 1 capsule (500 mg total) by mouth 3 (three) times daily for 10 days.  Dispense: 30 capsule; Refill: 0 -     Promethazine-DM; Take 5 mLs  by mouth 4 (four) times daily as needed for up to 7 days for cough.  Dispense: 118 mL; Refill: 0

## 2023-03-31 ENCOUNTER — Other Ambulatory Visit: Payer: Self-pay | Admitting: Internal Medicine

## 2023-04-01 ENCOUNTER — Other Ambulatory Visit: Payer: Self-pay | Admitting: Internal Medicine

## 2023-04-03 ENCOUNTER — Other Ambulatory Visit: Payer: Self-pay

## 2023-04-03 MED ORDER — FREESTYLE LIBRE 3 PLUS SENSOR MISC: 1.0000 | 3 refills | Status: DC

## 2023-04-03 NOTE — Telephone Encounter (Signed)
Requested Prescriptions   Signed Prescriptions Disp Refills   Continuous Glucose Sensor (FREESTYLE LIBRE 3 PLUS SENSOR) MISC 6 each 3    Sig: Inject 1 Device into the skin continuous. Change every 15 days    Authorizing Provider: Carlus Pavlov    Ordering User: Pollie Meyer

## 2023-04-04 ENCOUNTER — Other Ambulatory Visit: Payer: Self-pay | Admitting: Internal Medicine

## 2023-04-08 NOTE — Telephone Encounter (Signed)
Libre 3 plus was sent in another encounter.

## 2023-05-04 ENCOUNTER — Other Ambulatory Visit: Payer: Self-pay | Admitting: Family Medicine

## 2023-05-04 DIAGNOSIS — Z0000 Encounter for general adult medical examination without abnormal findings: Secondary | ICD-10-CM

## 2023-05-30 ENCOUNTER — Other Ambulatory Visit: Payer: Self-pay | Admitting: Internal Medicine

## 2023-07-05 ENCOUNTER — Ambulatory Visit: Payer: Commercial Managed Care - PPO | Admitting: Family Medicine

## 2023-07-05 ENCOUNTER — Encounter: Payer: Self-pay | Admitting: Family Medicine

## 2023-07-05 VITALS — BP 135/75 | HR 76 | Temp 97.2°F | Ht 65.0 in | Wt 155.6 lb

## 2023-07-05 DIAGNOSIS — J020 Streptococcal pharyngitis: Secondary | ICD-10-CM | POA: Diagnosis not present

## 2023-07-05 DIAGNOSIS — R059 Cough, unspecified: Secondary | ICD-10-CM

## 2023-07-05 DIAGNOSIS — J4540 Moderate persistent asthma, uncomplicated: Secondary | ICD-10-CM | POA: Diagnosis not present

## 2023-07-05 DIAGNOSIS — E119 Type 2 diabetes mellitus without complications: Secondary | ICD-10-CM | POA: Diagnosis not present

## 2023-07-05 LAB — POCT INFLUENZA A/B
Influenza A, POC: NEGATIVE
Influenza B, POC: NEGATIVE

## 2023-07-05 LAB — POC COVID19 BINAXNOW: SARS Coronavirus 2 Ag: NEGATIVE

## 2023-07-05 LAB — POCT RAPID STREP A (OFFICE): Rapid Strep A Screen: POSITIVE — AB

## 2023-07-05 MED ORDER — AMOXICILLIN 500 MG PO CAPS: 500.0000 mg | ORAL_CAPSULE | Freq: Three times a day (TID) | ORAL | 0 refills | Status: AC

## 2023-07-05 MED ORDER — PREDNISONE 20 MG PO TABS: 40.0000 mg | ORAL_TABLET | Freq: Every day | ORAL | 0 refills | Status: AC

## 2023-07-05 MED ORDER — PROMETHAZINE-DM 6.25-15 MG/5ML PO SYRP: 5.0000 mL | ORAL_SOLUTION | Freq: Four times a day (QID) | ORAL | 0 refills | Status: DC | PRN

## 2023-07-05 NOTE — Progress Notes (Signed)
   Denise Beard is a 63 y.o. female who presents today for an office visit.  Assessment/Plan:  New/Acute Problems: Strep Pharyngitis  Rapid strep positive. Covid and flu negative. Will treat with amoxicillin. No red flags or signs of systemic illness. Encouraged hydration. We discussed reasons to return to care and seek emergent care. Follow up as needed.   Chronic Problems Addressed Today: Asthma Overall reassuring exam today however she has a little more chest tightness and wheezing the last several days. Likely mild acute lfare related to above upper respiratory infection (URI). Will start prednisone burst. She has tolerated well in the past. She can continue her home inhalers as needed as well  TYPE 2 DIABETES MELLITUS Following with endocrinology for this. Last A1c 71. Do not anticipate she will have any issues with prednisone burst however she will monitor at home.     Subjective:  HPI:  Patient here with sore throat and congestion. Symptoms started about a week ago.  Some cough. She is having some wheezing. She did have a mild fever a few days ago. No chills. Some body aches the last days. She has had a few sick contacts with family members with flu and COVID.        Objective:  Physical Exam: BP 135/75   Pulse 76   Temp (!) 97.2 F (36.2 C) (Temporal)   Ht 5\' 5"  (1.651 m)   Wt 155 lb 9.6 oz (70.6 kg)   SpO2 98%   BMI 25.89 kg/m   Gen: No acute distress, resting comfortably HEENT: tympanic membranes with clear effusion bilaterally. OP erythematous. Nasal mucosa erythematous and boggy bilaterally CV: Regular rate and rhythm with no murmurs appreciated Pulm: Normal work of breathing, clear to auscultation bilaterally with no crackles, wheezes, or rhonchi Neuro: Grossly normal, moves all extremities Psych: Normal affect and thought content      Denise Beard M. Jimmey Ralph, MD 07/05/2023 10:39 AM

## 2023-07-05 NOTE — Patient Instructions (Addendum)
It was very nice to see you today!  Please start the amoxicillin and prednisone.  Use the cough medication(s) as needed.  Let us know if not improving.   Return if symptoms worsen or fail to improve.   Take care, Dr Jimmey Ralph  PLEASE NOTE:  If you had any lab tests, please let us know if you have not heard back within a few days. You may see your results on mychart before we have a chance to review them but we will give you a call once they are reviewed by [place].   If we ordered any referrals today, please let us know if you have not heard from their office within the next week.   If you had any urgent prescriptions sent in today, please check with the pharmacy within an hour of our visit to make sure the prescription was transmitted appropriately.   Please try these tips to maintain a healthy lifestyle:  Eat at least 3 REAL meals and 1-2 snacks per day.  Aim for no more than 5 hours between eating.  If you eat breakfast, please do so within one hour of getting up.   Each meal should contain half fruits/vegetables, one quarter protein, and one quarter carbs (no bigger than a computer mouse)  Cut down on sweet beverages. This includes juice, soda, and sweet tea.   Drink at least 1 glass of water with each meal and aim for at least 8 glasses per day  Exercise at least 150 minutes every week.

## 2023-07-18 ENCOUNTER — Ambulatory Visit: Payer: Commercial Managed Care - PPO | Admitting: Internal Medicine

## 2023-07-18 ENCOUNTER — Encounter: Payer: Self-pay | Admitting: Internal Medicine

## 2023-07-18 VITALS — BP 100/70 | HR 92 | Ht 65.0 in | Wt 155.6 lb

## 2023-07-18 DIAGNOSIS — E119 Type 2 diabetes mellitus without complications: Secondary | ICD-10-CM

## 2023-07-18 DIAGNOSIS — E785 Hyperlipidemia, unspecified: Secondary | ICD-10-CM

## 2023-07-18 DIAGNOSIS — E663 Overweight: Secondary | ICD-10-CM

## 2023-07-18 DIAGNOSIS — Z7985 Long-term (current) use of injectable non-insulin antidiabetic drugs: Secondary | ICD-10-CM

## 2023-07-18 DIAGNOSIS — Z7984 Long term (current) use of oral hypoglycemic drugs: Secondary | ICD-10-CM

## 2023-07-18 DIAGNOSIS — Z794 Long term (current) use of insulin: Secondary | ICD-10-CM

## 2023-07-18 LAB — POCT GLYCOSYLATED HEMOGLOBIN (HGB A1C): Hemoglobin A1C: 9.2 % — AB (ref 4.0–5.6)

## 2023-07-18 MED ORDER — OZEMPIC (0.25 OR 0.5 MG/DOSE) 2 MG/1.5ML ~~LOC~~ SOPN: 0.5000 mg | PEN_INJECTOR | SUBCUTANEOUS | 3 refills | Status: DC

## 2023-07-18 MED ORDER — INSULIN LISPRO (1 UNIT DIAL) 100 UNIT/ML (KWIKPEN): PEN_INJECTOR | SUBCUTANEOUS | 3 refills | Status: DC

## 2023-07-18 MED ORDER — LANTUS SOLOSTAR 100 UNIT/ML ~~LOC~~ SOPN: PEN_INJECTOR | SUBCUTANEOUS | 3 refills | Status: DC

## 2023-07-18 NOTE — Patient Instructions (Addendum)
 Please continue: - Metformin 2000 mg with dinner - Humalog 12-15 units 15 minutes before the 3 meals  Increase: - Lantus 40 units with dinner  - Ozempic 2 mg weekly  Please return in 4 months with your sugar log.

## 2023-07-18 NOTE — Progress Notes (Signed)
 Subjective:     Patient ID: Denise Beard, female   DOB: 09-Apr-1961, 63 y.o.   MRN: [id number]  Diabetes  Ms. Chenard is a pleasant 63 y.o. woman, returning for f/u for DM2, dx 2012, insulin-dependent, uncontrolled, without long-term complications. Last visit 4 months ago.  Interim history: No increased urination, nausea, blurry vision.  She had constipation from Ozempic but improved. She had a URI >> was on Prednisone 2 week ago. She did the Reuel Boom fast last mo. However, she ate many grapes during the fast.  The sugars did not improve.   She got a CGM since last visit but only used it until 2 months ago due to price.  Insurance does not cover it.  Reviewed HbA1c levels: 01/11/2023: HbA1c 8.1% Lab Results  Component Value Date   HGBA1C 7.1 (A) 07/13/2022   HGBA1C 7.7 (A) 03/09/2022   HGBA1C 7.5 (H) 11/13/2021   HGBA1C 7.0 (A) 07/14/2021   HGBA1C 7.6 (A) 03/10/2021   HGBA1C 7.8 (A) 09/09/2020   HGBA1C 10.2 (A) 02/26/2020   HGBA1C 10.2 (A) 08/28/2019   HGBA1C 7.3 (A) 04/23/2019   HGBA1C 7.6 (H) 10/17/2018   HGBA1C 6.8 (A) 05/02/2018   HGBA1C 7.6 (A) 01/24/2018   HGBA1C 6.5 09/20/2017   HGBA1C 7.0 (H) 05/24/2017   HGBA1C 6.9 05/17/2017   HGBA1C 7.0 11/30/2016   HGBA1C 7.3 08/10/2016   HGBA1C 7.3 (H) 04/06/2016   HGBA1C 6.9 12/16/2015   HGBA1C 7.2 09/16/2015   HGBA1C 7.0 04/22/2015   HGBA1C 7.0 01/14/2015   HGBA1C 7.6 (H) 10/15/2014   HGBA1C 8.5 (H) 07/08/2014   HGBA1C 6.7 (H) 02/26/2014   HGBA1C 7.0 (H) 10/30/2013   HGBA1C 7.5 (H) 07/17/2013   HGBA1C 7.3 (H) 02/06/2013   HGBA1C 6.9 (H) 10/06/2012   HGBA1C 8.1 (H) 06/30/2012   She is on: - Metformin 1000 mg 2x a day with meals >> 2000 mg with dinner -  Ozempic 1 mg weekly - Lantus 30 >> 40 >> 35 >> 40 >> 35 units after dinner - misses doses 50% of the time as she tries to take this at 10 PM but she may fall asleep - Humalog: 8-12 units 15 minutes before meals (uses 12) We stopped glipizide due to dizziness. She was on  saxagliptin/metformin XR 09/998 mg (Kombyglyze) in the past. She was on JanuMet 50/1000 bid in 2015 >> lightheaded, HA, blurry vision.  She is checking sugars 1-2x a day: - am:  89-142, 272 >> 80s, 94-174 >> 190s >> 201-281 - 2h after b'fast:   111-262, 382 >> 148-228, 244 >> n/c - before lunch:  99-321 >> 93-198 >> 63 >> 106-143 >> n/c - 2h after lunch:  84-182, 232 >> 77 >> n/c >> 281-400 - before dinner:  n/c >> 80-109, 184 >> 103 >> n/c - 2h after dinner: 160-190 >> 227, 259 >> 89-114 >> n/c - bedtime:  n/c >> 216-381 >> 199  >> n/c >> 98 >> n/c - nighttime: 109-188 >> n/c >> 125-170 >> n/c Lowest: 55 >> .Marland Kitchen. 100 >> 60s  Highest: 500s >> .Marland KitchenMarland Kitchen 400  Meter:  One Touch ultra 2  She saw nutrition in the past.  No CKD; latest BUN/creatinine: Lab Results  Component Value Date   BUN 8 11/15/2022   CREATININE 0.71 11/15/2022   No MAU: Lab Results  Component Value Date   MICRALBCREAT 0.6 11/15/2022   MICRALBCREAT 1.1 11/13/2021   MICRALBCREAT 0.8 11/09/2020   MICRALBCREAT 1.1 10/23/2019   MICRALBCREAT 8 05/27/2018  MICRALBCREAT 0.9 05/24/2017   MICRALBCREAT 0.8 04/06/2016   MICRALBCREAT 2.4 02/04/2015   MICRALBCREAT 0.4 10/30/2013   MICRALBCREAT 18.2 07/17/2013   + HL; lipids:  Lab Results  Component Value Date   CHOL 178 11/15/2022   HDL 41.30 11/15/2022   LDLCALC 77 11/09/2020   LDLDIRECT 107.0 11/15/2022   TRIG 228.0 (H) 11/15/2022   CHOLHDL 4 11/15/2022  On Zocor 20.  - Last eye exam 06/01/2022: No DR  (Dr Fredrich Birks [facility]).    - No numbness or tingling in her feet.  Last foot exam: Dr. Durene Cal 11/15/2022.  She also has a history of anxiety, asthma, GERD, constipation; polyarthropathy; seizure disorder.   Review of Systems + See HPI  I reviewed pt's medications, allergies, PMH, social hx, family hx, and changes were documented in the history of present illness. Otherwise, unchanged from my initial visit note.  Past Medical History:   Diagnosis Date   ANXIETY 03/17/2007   in past   ASTHMA 06/26/2007   DIAB W/O COMP TYPE II/UNS NOT STATED UNCNTRL 04/06/2009   GERD 06/26/2007   Irritable bowel syndrome 02/27/2010   constipation   OA (osteoarthritis) 12/13/2009   SEIZURE DISORDER 04/17/2010   in past /over 4 years ago/no meds. actually vasovagal per neuro notes   Past Surgical History:  Procedure Laterality Date   ABDOMINAL HYSTERECTOMY     CESAREAN SECTION     TUBAL LIGATION     Social History   Socioeconomic History   Marital status: Divorced    Spouse name: Not on file   Number of children: Not on file   Years of education: Not on file   Highest education level: Not on file  Occupational History   Not on file  Tobacco Use   Smoking status: Never   Smokeless tobacco: Never  Substance and Sexual Activity   Alcohol use: No   Drug use: No   Sexual activity: Not Currently    Birth control/protection: Surgical  Other Topics Concern   Not on file  Social History Narrative   Divorced/Single. 2 chldren. 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 1 each 2   azelastine (ASTELIN) 0.1 % nasal spray Place 2 sprays into both nostrils 2 (two) times daily. (Patient taking differently: Place 2 sprays into both nostrils as needed.) 30 mL 12   BD PEN NEEDLE  NANO 2ND GEN 32G X 4 MM MISC USE ONCE DAILY AS DIRECTED 100 each 0   budesonide-formoterol (SYMBICORT) 160-4.5 MCG/ACT inhaler Inhale 2 puffs into the lungs 2 (two) times daily. 1 each 11   Continuous Glucose Sensor (FREESTYLE LIBRE 3 PLUS SENSOR) MISC Inject 1 Device into the skin continuous. Change every 15 days 6 each  3   Continuous Glucose Sensor (FREESTYLE LIBRE 3 SENSOR) MISC 1 each by Does not apply route every 14 (fourteen) days. 6 each 3   gabapentin (NEURONTIN) 300 MG capsule Take 1 capsule (300 mg total) by mouth 3 (three) times daily as needed. 90 capsule 3   insulin glargine (LANTUS SOLOSTAR) 100 UNIT/ML Solostar Pen INJECT 35 UNITS INTO THE SKIN DAILY AT 10 PM. 30 mL 1   insulin lispro (HUMALOG KWIKPEN) 100 UNIT/ML KwikPen INJECT 8-14 UNITS INTO THE SKIN 3 TIMES A DAY BEFORE MEALS 30 mL 3   metFORMIN (GLUCOPHAGE) 1000 MG tablet TAKE 2 TABLETS (2,000 MG TOTAL) BY MOUTH DAILY WITH SUPPER. 180 tablet 3   promethazine-dextromethorphan (PROMETHAZINE-DM) 6.25-15 MG/5ML syrup Take 5 mLs by mouth 4 (four) times daily as needed. 118 mL 0   Semaglutide, 1 MG/DOSE, (OZEMPIC, 1 MG/DOSE,) 4 MG/3ML SOPN INJECT 1 MG INTO THE SKIN ONE TIME PER WEEK 9 mL 1   simvastatin (ZOCOR) 20 MG tablet TAKE 1 TABLET BY MOUTH EVERYDAY AT BEDTIME 90 tablet 3   zolpidem (AMBIEN) 10 MG tablet TAKE 1 TABLET BY MOUTH EVERY DAY AT BEDTIME AS NEEDED FOR SLEEP 30 tablet 5   No current facility-administered medications on file prior to visit.   Allergies  Allergen Reactions   Iodine     Per the patient. Had an oral solution in the 90's that gave her a bad reaction of hallucinations.    Family History  Problem Relation Age of Onset   Thyroid disease Mother    Hyperlipidemia Mother    Breast cancer Mother 11   Arthritis Mother    Cancer Mother    Hypertension Mother    Skin cancer Father        not melanoma- passed at 86   Colon cancer Maternal Uncle    Stomach cancer Maternal Uncle    Lung cancer Maternal  Uncle    Cancer Maternal Grandfather        unknown   Diabetes Maternal Grandfather     Objective:   Physical Exam  BP 100/70   Pulse 92   Ht 5\' 5"  (1.651 m)   Wt 155 lb 9.6 oz (70.6 kg)   SpO2 97%   BMI 25.89 kg/m    Wt Readings from Last 3 Encounters:  07/18/23 155 lb 9.6 oz (70.6 kg)  07/05/23 155 lb 9.6 oz (70.6 kg)  01/14/23 159 lb (72.1 kg)   Constitutional: overweight, in NAD Eyes:  EOMI, no exophthalmos ENT: no neck masses, no cervical lymphadenopathy Cardiovascular: RRR, No MRG Respiratory: CTA B Musculoskeletal: no deformities Skin:no rashes Neurological: no tremor with outstretched hands  Assessment:     1. DM2, insulin-dependent, uncontrolled, without long term complications, but with hyperglycemia  Type 1 diabetes investigation was negative: Component     Latest Ref Rng & Units 01/24/2018  ZNT8 Antibodies     U/mL <15  Islet Cell Ab     Neg:<1:1 Negative  Glutamic Acid Decarb Ab     <5 IU/mL <5  C-Peptide     0.80 - 3.85 ng/mL 2.07  Glucose, Plasma     65 - 99 mg/dL 102 (H)   2. HL  3.  Overweight    Plan:     Pt with longstanding, uncontrolled, type 2 diabetes, on metformin, basal-bolus insulin regimen and weekly GLP-1 receptor agonist, with still suboptimal control.  At last visit, HbA1c was higher, at 8.1%.  At that time we discussed about taking her insulin consistently  and I also recommended a CGM.  Sugars were mostly at goal but she was only checking in the morning at that time. -At today's visit, sugars are very high, the highest I have seen more recently for her.  Upon questioning, she is still missing almost 50% of the Lantus dose.  We discussed about the importance of taking it daily.  Also, we reviewed correct injection techniques and she was not doing the injections correctly.  I also advised her to rotate the injection sites more, as she is using only a small site on the abdomen to inject.  We will increase that dose of Lantus and I  advised her to try to take it around dinnertime or whenever she thinks that she can remember to take it daily.  I recommended to continue the dose of Humalog but maybe take a slightly higher dose before larger meals.  Will also try to increase the Ozempic to further help with the blood sugars. - I advised her to: Patient Instructions  Please continue: - Metformin 2000 mg with dinner - Humalog 12-15 units 15 minutes before the 3 meals  Increase: - Lantus 40 units with dinner  - Ozempic 2 mg weekly  Please return in 4 months with your sugar log.  - we checked her HbA1c: 9.2% (higher) - advised to check sugars at different times of the day - 4x a day, rotating check times - advised for yearly eye exams >> she is not UTD - return to clinic in 4 months  2. HL  Latest lipid panel showed an LDL higher than target and triglycerides also slightly high: Lab Results  Component Value Date   CHOL 178 11/15/2022   HDL 41.30 11/15/2022   LDLCALC 77 11/09/2020   LDLDIRECT 107.0 11/15/2022   TRIG 228.0 (H) 11/15/2022   CHOLHDL 4 11/15/2022  -She continues Zocor 20 mg daily without side effects.  3.  Overweight -Will continue Ozempic at 1 mg weekly, which should also help with weight loss -She gained 5 pounds before last visit, previously lost 9 pounds during a fast for church -She lost 4 pounds since last visit  Carlus Pavlov, MD PhD [facility]

## 2023-08-30 ENCOUNTER — Other Ambulatory Visit: Payer: Self-pay | Admitting: Internal Medicine

## 2023-08-30 DIAGNOSIS — E119 Type 2 diabetes mellitus without complications: Secondary | ICD-10-CM

## 2023-09-06 LAB — HM DIABETES EYE EXAM

## 2023-10-27 ENCOUNTER — Other Ambulatory Visit: Payer: Self-pay | Admitting: Family Medicine

## 2023-10-31 ENCOUNTER — Other Ambulatory Visit: Payer: Self-pay | Admitting: Family Medicine

## 2023-10-31 DIAGNOSIS — Z0000 Encounter for general adult medical examination without abnormal findings: Secondary | ICD-10-CM

## 2023-11-01 ENCOUNTER — Other Ambulatory Visit: Payer: Self-pay | Admitting: Family Medicine

## 2023-11-01 DIAGNOSIS — Z0000 Encounter for general adult medical examination without abnormal findings: Secondary | ICD-10-CM

## 2023-11-01 MED ORDER — ZOLPIDEM TARTRATE 10 MG PO TABS: ORAL_TABLET | ORAL | 5 refills | Status: DC

## 2023-11-01 NOTE — Telephone Encounter (Signed)
 Copied from CRM [id number]. Topic: Clinical - Medication Refill >> Nov 01, 2023 11:42 AM Allyne Areola wrote: Medication: zolpidem  (AMBIEN ) 10 MG tablet [[id number]]  Has the patient contacted their pharmacy? Yes, they asked patient to contact primary care provider (Agent: If no, request that the patient contact the pharmacy for the refill. If patient does not wish to contact the pharmacy document the reason why and proceed with request.) (Agent: If yes, when and what did the pharmacy advise?)  This is the patient's preferred pharmacy:  [id number] [place], [place] - [address] [facility] [address] [place] [address] Phone: [phone] Fax: [phone]  Is this the correct pharmacy for this prescription? Yes If no, delete pharmacy and type the correct one.   Has the prescription been filled recently? No  Is the patient out of the medication? Yes  Has the patient been seen for an appointment in the last year OR does the patient have an upcoming appointment? Yes  Can we respond through MyChart? Yes  Agent: Please be advised that Rx refills may take up to 3 business days. We ask that you follow-up with your pharmacy.

## 2023-11-02 ENCOUNTER — Other Ambulatory Visit: Payer: Self-pay | Admitting: Internal Medicine

## 2023-11-14 ENCOUNTER — Other Ambulatory Visit: Payer: Self-pay | Admitting: Internal Medicine

## 2023-11-15 ENCOUNTER — Ambulatory Visit (INDEPENDENT_AMBULATORY_CARE_PROVIDER_SITE_OTHER): Payer: Commercial Managed Care - PPO | Admitting: Family

## 2023-11-15 ENCOUNTER — Encounter: Payer: Commercial Managed Care - PPO | Admitting: Family Medicine

## 2023-11-15 ENCOUNTER — Encounter: Payer: Self-pay | Admitting: Family

## 2023-11-15 VITALS — BP 120/82 | HR 76 | Temp 98.0°F | Ht 65.0 in | Wt 163.0 lb

## 2023-11-15 DIAGNOSIS — E010 Iodine-deficiency related diffuse (endemic) goiter: Secondary | ICD-10-CM

## 2023-11-15 DIAGNOSIS — R229 Localized swelling, mass and lump, unspecified: Secondary | ICD-10-CM

## 2023-11-15 DIAGNOSIS — E119 Type 2 diabetes mellitus without complications: Secondary | ICD-10-CM

## 2023-11-15 DIAGNOSIS — E1169 Type 2 diabetes mellitus with other specified complication: Secondary | ICD-10-CM | POA: Diagnosis not present

## 2023-11-15 DIAGNOSIS — Z0000 Encounter for general adult medical examination without abnormal findings: Secondary | ICD-10-CM | POA: Diagnosis not present

## 2023-11-15 DIAGNOSIS — E785 Hyperlipidemia, unspecified: Secondary | ICD-10-CM

## 2023-11-15 LAB — COMPREHENSIVE METABOLIC PANEL WITH GFR
ALT: 15 U/L (ref 0–35)
AST: 17 U/L (ref 0–37)
Albumin: 4 g/dL (ref 3.5–5.2)
Alkaline Phosphatase: 76 U/L (ref 39–117)
BUN: 11 mg/dL (ref 6–23)
CO2: 27 meq/L (ref 19–32)
Calcium: 9.2 mg/dL (ref 8.4–10.5)
Chloride: 106 meq/L (ref 96–112)
Creatinine, Ser: 0.66 mg/dL (ref 0.40–1.20)
GFR: 94.65 mL/min (ref >60.00)
Glucose, Bld: 218 mg/dL — ABNORMAL HIGH (ref 70–99)
Potassium: 4.2 meq/L (ref 3.5–5.1)
Sodium: 139 meq/L (ref 135–145)
Total Bilirubin: 0.5 mg/dL (ref 0.2–1.2)
Total Protein: 7.1 g/dL (ref 6.0–8.3)

## 2023-11-15 LAB — CBC WITH DIFFERENTIAL/PLATELET
Basophils Absolute: 0 10*3/uL (ref 0.0–0.1)
Basophils Relative: 0.5 % (ref 0.0–3.0)
Eosinophils Absolute: 0.1 10*3/uL (ref 0.0–0.7)
Eosinophils Relative: 1.2 % (ref 0.0–5.0)
HCT: 37.8 % (ref 36.0–46.0)
Hemoglobin: 12.3 g/dL (ref 12.0–15.0)
Lymphocytes Relative: 44.8 % (ref 12.0–46.0)
Lymphs Abs: 3.5 10*3/uL (ref 0.7–4.0)
MCHC: 32.4 g/dL (ref 30.0–36.0)
MCV: 90.5 fl (ref 78.0–100.0)
Monocytes Absolute: 0.6 10*3/uL (ref 0.1–1.0)
Monocytes Relative: 7.7 % (ref 3.0–12.0)
Neutro Abs: 3.6 10*3/uL (ref 1.4–7.7)
Neutrophils Relative %: 45.8 % (ref 43.0–77.0)
Platelets: 264 10*3/uL (ref 150.0–400.0)
RBC: 4.18 Mil/uL (ref 3.87–5.11)
RDW: 13.5 % (ref 11.5–15.5)
WBC: 7.8 10*3/uL (ref 4.0–10.5)

## 2023-11-15 LAB — LIPID PANEL
Cholesterol: 161 mg/dL (ref 0–200)
HDL: 37.9 mg/dL — ABNORMAL LOW (ref >39.00)
LDL Cholesterol: 98 mg/dL (ref 0–99)
NonHDL: 122.8
Total CHOL/HDL Ratio: 4
Triglycerides: 124 mg/dL (ref 0.0–149.0)
VLDL: 24.8 mg/dL (ref 0.0–40.0)

## 2023-11-15 LAB — TSH: TSH: 1.52 u[IU]/mL (ref 0.35–5.50)

## 2023-11-15 LAB — HEMOGLOBIN A1C: Hgb A1c MFr Bld: 8.6 % — ABNORMAL HIGH (ref 4.6–6.5)

## 2023-11-15 NOTE — Progress Notes (Signed)
 Phone [phone]  Subjective:   Patient is a 63 y.o. female presenting for annual physical.    Chief Complaint  Patient presents with   Annual Exam    Fasting, would like full lab panel including A1c.  Discussed the use of AI scribe software for clinical note transcription with the patient, who gave verbal consent to proceed.  History of Present Illness Nazarene MILINDA SWEENEY is a 63 year old female who presents for an annual physical exam.  She is feeling well overall and is interested in having her A1C checked as part of her diabetes management. She continues to manage her diabetes with Dr. Larrie. She has had a mammogram last year and regular PAP smears via Dr Latisha at [facility]. Her last colonoscopy was in 2016, and she is due for another next year. She experiences shoulder pain, which she attributes to tension and possibly her 'frozen shoulder.' The pain is located in the shoulder area, is not tender to touch, but feels sore. She has had this condition for several years. No history of shoulder injury. She denies alcohol consumption and has never smoked or vaped. She is trying to increase her physical activity by walking more, although it is not as much as she would like. She works from home and has limited social interaction, going to the office once a week. She is attempting to increase her water intake to two liters a day. Her mother had thyroid  issues and had her thyroid  removed. She is unsure if she has had her thyroid  scanned before but notes some swelling in the neck area. No history of thyroid  disease.  Assessment & Plan Thyroid  Swelling Swelling in the neck with family history of thyroid  disease. Potential need for thyroid  ultrasound. - Order thyroid  ultrasound. - Refer to radiology for thyroid  ultrasound scheduling.  T2 Diabetes Mellitus Continues diabetes management with Dr. Irean. Requests A1c check today, last one in EMR 12/2022. - Order A1c test.  General  Health Maintenance Encouraged increased physical activity for cardiovascular health, diabetes management, stress reduction, and cancer prevention. Discussed hydration strategies. - Order cholesterol, blood count, metabolic panel, and thyroid  function tests. - Encourage regular exercise, particularly walking. - Advise increasing water intake to 2 liters per day. - Discuss options for flavoring water to improve palatability.   See problem oriented charting- ROS- full  review of systems was completed and negative except for: what is noted in HPI above.  The following were reviewed and entered/updated in epic: Past Medical History:  Diagnosis Date   ANXIETY 03/17/2007   in past   ASTHMA 06/26/2007   DIAB W/O COMP TYPE II/UNS NOT STATED UNCNTRL 04/06/2009   GERD 06/26/2007   Irritable bowel syndrome 02/27/2010   constipation   OA (osteoarthritis) 12/13/2009   SEIZURE DISORDER 04/17/2010   in past /over 4 years ago/no meds. actually vasovagal per neuro notes   Patient Active Problem List   Diagnosis Date Noted   Melanocytic nevi of trunk 12/11/2021   Osteoarthritis 02/04/2015   Vasovagal syncope 02/04/2015   Hyperlipidemia associated with type 2 diabetes mellitus (HCC) 02/04/2015   Allergic rhinitis 02/04/2015   Insomnia 02/04/2015   Irritable bowel syndrome 02/27/2010   Type II diabetes mellitus, well controlled (HCC) 04/06/2009   Asthma 06/26/2007   GERD 06/26/2007   Past Surgical History:  Procedure Laterality Date   ABDOMINAL HYSTERECTOMY     CESAREAN SECTION     TUBAL LIGATION      Family History  Problem Relation Age  of Onset   Thyroid  disease Mother    Hyperlipidemia Mother    Breast cancer Mother 48   Arthritis Mother    Cancer Mother    Hypertension Mother    Skin cancer Father        not melanoma- passed at 88   Colon cancer Maternal Uncle    Stomach cancer Maternal Uncle    Lung cancer Maternal Uncle    Cancer Maternal Grandfather        unknown   Diabetes  Maternal Grandfather     Medications- reviewed and updated Current Outpatient Medications  Medication Sig Dispense Refill   albuterol  (VENTOLIN  HFA) 108 (90 Base) MCG/ACT inhaler Inhale 2 puffs into the lungs every 6 (six) hours as needed for wheezing or shortness of breath. 1 each 2   azelastine  (ASTELIN ) 0.1 % nasal spray Place 2 sprays into both nostrils 2 (two) times daily. 30 mL 12   BD PEN NEEDLE NANO 2ND GEN 32G X 4 MM MISC USE ONCE DAILY AS DIRECTED 100 each 0   budesonide -formoterol  (SYMBICORT ) 160-4.5 MCG/ACT inhaler Inhale 2 puffs into the lungs 2 (two) times daily. 1 each 11   Continuous Glucose Sensor (FREESTYLE LIBRE 3 PLUS SENSOR) MISC Inject 1 Device into the skin continuous. Change every 15 days 6 each 3   Continuous Glucose Sensor (FREESTYLE LIBRE 3 SENSOR) MISC 1 each by Does not apply route every 14 (fourteen) days. 6 each 3   gabapentin  (NEURONTIN ) 300 MG capsule Take 1 capsule (300 mg total) by mouth 3 (three) times daily as needed. 90 capsule 3   insulin  glargine (LANTUS  SOLOSTAR) 100 UNIT/ML Solostar Pen INJECT 40 UNITS INTO THE SKIN DAILY at bedtime 45 mL 3   insulin  lispro (HUMALOG  KWIKPEN) 100 UNIT/ML KwikPen INJECT 12-15 UNITS INTO THE SKIN 3 TIMES A DAY BEFORE MEALS 30 mL 3   metFORMIN  (GLUCOPHAGE ) 1000 MG tablet TAKE 2 TABLETS (2,000 MG TOTAL) BY MOUTH DAILY WITH SUPPER. 180 tablet 3   promethazine -dextromethorphan (PROMETHAZINE -DM) 6.25-15 MG/5ML syrup Take 5 mLs by mouth 4 (four) times daily as needed. 118 mL 0   Semaglutide , 2 MG/DOSE, (OZEMPIC , 2 MG/DOSE,) 8 MG/3ML SOPN Inject 2 mg into the skin once a week. 9 mL 3   simvastatin  (ZOCOR ) 20 MG tablet TAKE 1 TABLET BY MOUTH EVERYDAY AT BEDTIME 90 tablet 3   zolpidem  (AMBIEN ) 10 MG tablet TAKE 1 TABLET BY MOUTH EVERY DAY AT BEDTIME AS NEEDED FOR SLEEP 30 tablet 5   No current facility-administered medications for this visit.    Allergies-reviewed and updated Allergies  Allergen Reactions   Iodine     Per the  patient. Had an oral solution in the 90's that gave her a bad reaction of hallucinations.     Social History   Social History Narrative   Divorced/Single. 2 chldren. 4 grandkids (first grandson included in 56 due 2023)   Moved in with mother to help her      Works; Customer service manager for background checks - [place]      Hobbies: church very active       Objective:  BP 120/82   Pulse 76   Temp 98 F (36.7 C)   Ht 5' 5 (1.651 m)   Wt 163 lb (73.9 kg)   SpO2 97%   BMI 27.12 kg/m  Physical Exam Vitals and nursing note reviewed.  Constitutional:      Appearance: Normal appearance.  HENT:     Head: Normocephalic.  Right Ear: Tympanic membrane normal.     Left Ear: Tympanic membrane normal.     Nose: Nose normal.     Mouth/Throat:     Mouth: Mucous membranes are moist.   Eyes:     Pupils: Pupils are equal, round, and reactive to light.   Neck:     Thyroid : Thyromegaly (mild, right) present.   Cardiovascular:     Rate and Rhythm: Normal rate and regular rhythm.  Pulmonary:     Effort: Pulmonary effort is normal.     Breath sounds: Normal breath sounds.   Musculoskeletal:        General: Normal range of motion.     Cervical back: Normal range of motion. Edema (left side, soft tisse) present. Muscular tenderness present.  Lymphadenopathy:     Cervical: No cervical adenopathy.   Skin:    General: Skin is warm and dry.   Neurological:     Mental Status: She is alert.   Psychiatric:        Mood and Affect: Mood normal.        Behavior: Behavior normal.     Assessment and Plan   Health Maintenance counseling: 1. Anticipatory guidance: Patient counseled regarding regular dental exams q6 months, eye exams,  avoiding smoking and second hand smoke, limiting alcohol to 1 beverage per day, no illicit drugs.   2. Risk factor reduction:  Advised patient of need for regular exercise and diet rich with fruits and vegetables to reduce risk of heart  attack and stroke. Exercise- walking occasionally.  Wt Readings from Last 3 Encounters:  11/15/23 163 lb (73.9 kg)  07/18/23 155 lb 9.6 oz (70.6 kg)  07/05/23 155 lb 9.6 oz (70.6 kg)   3. Immunizations/screenings/ancillary studies Immunization History  Administered Date(s) Administered   Influenza Whole 02/19/2011   Influenza,inj,Quad PF,6+ Mos 05/24/2017, 04/05/2018, 04/23/2019, 03/10/2021, 01/30/2022   Influenza-Unspecified 02/09/2015, 01/24/2016, 04/30/2018   Moderna SARS-COV2 Booster Vaccination 03/15/2020   Moderna Sars-Covid-2 Vaccination 07/30/2019, 09/01/2019   Pneumococcal Conjugate-13 11/10/2014   Pneumococcal Polysaccharide-23 10/23/2019   Td 09/02/2007   Tdap 10/17/2018   Zoster Recombinant(Shingrix ) 11/13/2021, 01/30/2022   Health Maintenance Due  Topic Date Due   Cervical Cancer Screening (HPV/Pap Cotest)  02/19/2021   MAMMOGRAM  06/27/2023   Diabetic kidney evaluation - eGFR measurement  11/15/2023   Diabetic kidney evaluation - Urine ACR  11/15/2023   FOOT EXAM  11/15/2023    4. Cervical cancer screening-  PAP done thru [facility] latisha 5. Breast cancer screening-  reports mammogram done 2023, will call to schedule. 6. Colon cancer screening -  02/17/15 with 10 year follow up 7. Skin cancer screening- advised regular sunscreen use. Denies worrisome, changing, or new skin lesions.  8. Birth control/STD check-  N/A 9. Osteoporosis screening-  DEXA done thru GYN 10. Alcohol screening:  none 11. Smoking associated screening (lung cancer screening, AAA screen 65-75, UA)- never- smoker   Recommended follow up:  No follow-ups on file. Future Appointments  Date Time Provider Department Center  11/29/2023  3:40 PM Trixie File, MD [facility] None    Lab/Order associations: fasting   Lucius Krabbe, NP

## 2023-11-15 NOTE — Patient Instructions (Addendum)
 It was very nice to see you today!   I will review your lab results via MyChart in a few days. I have ordered a thyroid  ultrasound, they will call you to schedule. Have a great rest of the summer!     PLEASE NOTE:  If you had any lab tests please let us  know if you have not heard back within a few days. You may see your results on MyChart before we have a chance to review them but we will give you a call once they are reviewed by us . If we ordered any referrals today, please let us  know if you have not heard from their office within the next week.

## 2023-11-18 ENCOUNTER — Ambulatory Visit: Payer: Self-pay | Admitting: Family

## 2023-11-18 DIAGNOSIS — E041 Nontoxic single thyroid nodule: Secondary | ICD-10-CM

## 2023-11-29 ENCOUNTER — Ambulatory Visit: Payer: Commercial Managed Care - PPO | Admitting: Internal Medicine

## 2023-11-30 ENCOUNTER — Other Ambulatory Visit: Payer: Self-pay | Admitting: Family Medicine

## 2023-12-18 ENCOUNTER — Ambulatory Visit
Admission: RE | Admit: 2023-12-18 | Discharge: 2023-12-18 | Disposition: A | Discharge status: Home / Self Care | Source: Ambulatory Visit | Attending: Family | Admitting: Family

## 2023-12-18 DIAGNOSIS — E010 Iodine-deficiency related diffuse (endemic) goiter: Secondary | ICD-10-CM

## 2023-12-23 ENCOUNTER — Encounter: Payer: Self-pay | Admitting: Family Medicine

## 2023-12-23 DIAGNOSIS — E041 Nontoxic single thyroid nodule: Secondary | ICD-10-CM

## 2023-12-25 ENCOUNTER — Ambulatory Visit
Admission: RE | Admit: 2023-12-25 | Discharge: 2023-12-25 | Disposition: A | Discharge status: Home / Self Care | Source: Ambulatory Visit | Attending: Family Medicine | Admitting: Family Medicine

## 2023-12-25 ENCOUNTER — Other Ambulatory Visit (HOSPITAL_COMMUNITY)
Admission: RE | Admit: 2023-12-25 | Discharge: 2023-12-25 | Disposition: A | Discharge status: Home / Self Care | Source: Ambulatory Visit | Attending: Family Medicine | Admitting: Family Medicine

## 2023-12-25 DIAGNOSIS — E041 Nontoxic single thyroid nodule: Secondary | ICD-10-CM

## 2023-12-26 ENCOUNTER — Ambulatory Visit: Payer: Self-pay | Admitting: Family Medicine

## 2023-12-26 LAB — CYTOLOGY - NON PAP

## 2023-12-27 ENCOUNTER — Ambulatory Visit: Payer: Self-pay

## 2023-12-27 NOTE — Telephone Encounter (Signed)
 Forwarding to Dr. Katrinka to review and advise.

## 2023-12-27 NOTE — Telephone Encounter (Signed)
 There is a hold at 320 on Monday and it states wait list to-is that still being held for someone who can work her into that slot

## 2023-12-27 NOTE — Telephone Encounter (Signed)
 See below, willing to work pt in?

## 2023-12-27 NOTE — Telephone Encounter (Signed)
 FYI Only or Action Required?: Action required by provider: request for appointment.  Patient was last seen in primary care on 11/15/2023 by Lucius Krabbe, NP.  Called Nurse Triage reporting Sore Throat.  Symptoms began several weeks ago.  Interventions attempted: Nothing.  Symptoms are: stable. Pt. Had her biopsy this week on nodule. Asking to be worked in with Dr. Katrinka. No availability. Please advise pt.  Triage Disposition: See PCP When Office is Open (Within 3 Days)  Patient/caregiver understands and will follow disposition?: Yes   Copied from CRM [id number]. Topic: Clinical - Red Word Triage >> Dec 27, 2023 12:56 PM Suzen RAMAN wrote: Red Word that prompted transfer to Nurse Triage: swelling around neck,sore throat,cough and hoarness. Patient not sure if this is the result of recent biopsy. Requesting to schedule a f/u with provider Answer Assessment - Initial Assessment Questions 1. ONSET: When did the throat start hurting? (Hours or days ago)      Several months 2. SEVERITY: How bad is the sore throat? (Scale 1-10; mild, moderate or severe)     4-5 3. STREP EXPOSURE: Has there been any exposure to strep within the past week? If Yes, ask: What type of contact occurred?      no 4.  VIRAL SYMPTOMS: Are there any symptoms of a cold, such as a runny nose, cough, hoarse voice or red eyes?      cough 5. FEVER: Do you have a fever? If Yes, ask: What is your temperature, how was it measured, and when did it start?     no 6. PUS ON THE TONSILS: Is there pus on the tonsils in the back of your throat?     no 7. OTHER SYMPTOMS: Do you have any other symptoms? (e.g., difficulty breathing, headache, rash)     no 8. PREGNANCY: Is there any chance you are pregnant? When was your last menstrual period?     no  Protocols used: Sore Throat-A-AH  Reason for Disposition  [1] Sore throat is the only symptom AND [2] present > 48 hours  Answer Assessment - Initial  Assessment Questions 1. ONSET: When did the throat start hurting? (Hours or days ago)      Several months 2. SEVERITY: How bad is the sore throat? (Scale 1-10; mild, moderate or severe)     4-5 3. STREP EXPOSURE: Has there been any exposure to strep within the past week? If Yes, ask: What type of contact occurred?      no 4.  VIRAL SYMPTOMS: Are there any symptoms of a cold, such as a runny nose, cough, hoarse voice or red eyes?      cough 5. FEVER: Do you have a fever? If Yes, ask: What is your temperature, how was it measured, and when did it start?     no 6. PUS ON THE TONSILS: Is there pus on the tonsils in the back of your throat?     no 7. OTHER SYMPTOMS: Do you have any other symptoms? (e.g., difficulty breathing, headache, rash)     no 8. PREGNANCY: Is there any chance you are pregnant? When was your last menstrual period?     no  Protocols used: Sore Throat-A-AH

## 2023-12-30 ENCOUNTER — Ambulatory Visit: Admitting: Family Medicine

## 2023-12-30 ENCOUNTER — Encounter: Payer: Self-pay | Admitting: Family Medicine

## 2023-12-30 VITALS — BP 122/74 | HR 85 | Temp 97.2°F | Ht 65.0 in | Wt 162.6 lb

## 2023-12-30 DIAGNOSIS — G4700 Insomnia, unspecified: Secondary | ICD-10-CM

## 2023-12-30 DIAGNOSIS — E119 Type 2 diabetes mellitus without complications: Secondary | ICD-10-CM

## 2023-12-30 DIAGNOSIS — J4530 Mild persistent asthma, uncomplicated: Secondary | ICD-10-CM

## 2023-12-30 DIAGNOSIS — Z7985 Long-term (current) use of injectable non-insulin antidiabetic drugs: Secondary | ICD-10-CM | POA: Diagnosis not present

## 2023-12-30 DIAGNOSIS — R222 Localized swelling, mass and lump, trunk: Secondary | ICD-10-CM | POA: Diagnosis not present

## 2023-12-30 DIAGNOSIS — E785 Hyperlipidemia, unspecified: Secondary | ICD-10-CM

## 2023-12-30 DIAGNOSIS — E1169 Type 2 diabetes mellitus with other specified complication: Secondary | ICD-10-CM | POA: Diagnosis not present

## 2023-12-30 NOTE — Progress Notes (Signed)
 Phone [phone] In person visit   Subjective:   Denise Beard is a 63 y.o. year old very pleasant female patient who presents for/with See problem oriented charting Chief Complaint  Patient presents with   Sore Throat    Pt has consistent pain in her throat x2 months off and on;     Past Medical History-  Patient Active Problem List   Diagnosis Date Noted   Type II diabetes mellitus, well controlled (HCC) 04/06/2009    Priority: High   Hyperlipidemia associated with type 2 diabetes mellitus (HCC) 02/04/2015    Priority: Medium    Insomnia 02/04/2015    Priority: Medium    Asthma 06/26/2007    Priority: Medium    Osteoarthritis 02/04/2015    Priority: Low   Vasovagal syncope 02/04/2015    Priority: Low   Allergic rhinitis 02/04/2015    Priority: Low   Irritable bowel syndrome 02/27/2010    Priority: Low   GERD 06/26/2007    Priority: Low   Melanocytic nevi of trunk 12/11/2021    Medications- reviewed and updated Current Outpatient Medications  Medication Sig Dispense Refill   azelastine  (ASTELIN ) 0.1 % nasal spray Place 2 sprays into both nostrils 2 (two) times daily. 30 mL 12   BD PEN NEEDLE NANO 2ND GEN 32G X 4 MM MISC USE ONCE DAILY AS DIRECTED 100 each 0   budesonide -formoterol  (SYMBICORT ) 160-4.5 MCG/ACT inhaler Inhale 2 puffs into the lungs 2 (two) times daily. 1 each 11   Continuous Glucose Sensor (FREESTYLE LIBRE 3 PLUS SENSOR) MISC Inject 1 Device into the skin continuous. Change every 15 days 6 each 3   Continuous Glucose Sensor (FREESTYLE LIBRE 3 SENSOR) MISC 1 each by Does not apply route every 14 (fourteen) days. 6 each 3   gabapentin  (NEURONTIN ) 300 MG capsule TAKE 1 CAPSULE BY MOUTH 3 TIMES DAILY AS NEEDED. 90 capsule 3   insulin  glargine (LANTUS  SOLOSTAR) 100 UNIT/ML Solostar Pen INJECT 40 UNITS INTO THE SKIN DAILY at bedtime 45 mL 3   insulin  lispro (HUMALOG  KWIKPEN) 100 UNIT/ML KwikPen INJECT 12-15 UNITS INTO THE SKIN 3 TIMES A DAY BEFORE MEALS 30  mL 3   metFORMIN  (GLUCOPHAGE ) 1000 MG tablet TAKE 2 TABLETS (2,000 MG TOTAL) BY MOUTH DAILY WITH SUPPER. 180 tablet 3   Semaglutide , 2 MG/DOSE, (OZEMPIC , 2 MG/DOSE,) 8 MG/3ML SOPN Inject 2 mg into the skin once a week. 9 mL 3   simvastatin  (ZOCOR ) 20 MG tablet TAKE 1 TABLET BY MOUTH EVERYDAY AT BEDTIME 90 tablet 3   zolpidem  (AMBIEN ) 10 MG tablet TAKE 1 TABLET BY MOUTH EVERY DAY AT BEDTIME AS NEEDED FOR SLEEP 30 tablet 5   No current facility-administered medications for this visit.     Objective:  BP 122/74 (BP Location: Left Arm, Patient Position: Sitting, Cuff Size: Normal)   Pulse 85   Temp (!) 97.2 F (36.2 C) (Temporal)   Ht 5' 5 (1.651 m)   Wt 162 lb 9.6 oz (73.8 kg)   SpO2 95%   BMI 27.06 kg/m  Gen: NAD, resting comfortably No cervical lymphadenopathy, supraclavicular fullness noted on the left greater than the right without adenopathy CV: RRR no murmurs rubs or gallops Lungs: CTAB no crackles, wheeze, rhonchi Ext: no edema Skin: warm, dry     Assessment and Plan   # Left supraclavicular fullness S:patient with recent biopsy of right thyroid  nodule which fortunately was benign with [employer] category 2  Has noted supraclavicular fullness on the left side  at least for a few years. Feels like has been larger in last 3 months even above baseline. At times it feels like she gets fullness into her neck- almost like this is moving upwards.   Looking back she had a visit on 11/26/14 with Kenney Roys, FNP for mass of left shoudler- in description complaints of a mass to her left shoulder and neck area present 3-4 weeks. Denies any tenderness to palpation. Reports a pulling sensation in her left neck when she turns her head. Has noticed that she's had more frequent headaches since this mass is been present. Denies any injury. No heavy lifting, bending, twisting or turning. Has not taken any medication for relief. Also has concerns of sinus congestion and sneezing 2 days. Denies  any fever or chills. Has not taken any medication for relief. Symptoms stable. -the plan was CT of the neck on 12/02/14 - thought to likely be lipoma but CT showed Negative CT scan neck and supraclavicular regions. No mass lesion. Prominent but otherwise normal subcutaneous fat in the area of concern. A/P: Patient with longstanding asymmetrical supraclavicular fullness on the left side-perhaps slightly worse in the last 3 months.  We reviewed chart showing issues dating back to at least 2016-this was very reassuring for her.  She reports she mainly got concerned due to the thyroid  nodule but we reviewed the benign pathology and also the fact that she does not need repeat biopsy-she may run by need for any monitoring by Dr. Trixie at next appointment -Out of abundance of precaution we offered updating CT of the neck but patient declines - In this timeframe thankfully she had reassuring CBC and CMP as well as TSH-we did not feel she needed to repeat at this time  % Diabetes-managed by Dr. Trixie RAMAN: Medication: Ozempic  2 mg, Lantus  40 units, insulin  lispro 12 to 15 units before each meal, metformin  1000 mg twice daily   Lab Results  Component Value Date   HGBA1C 8.6 (H) 11/15/2023   HGBA1C 9.2 (A) 07/18/2023   HGBA1C 8.1 01/11/2023  A/P: Poor control of A1c and patient had to cancel her last appoint with Dr. Leola strongly encouraged her to reschedule follow-up as she agrees - We will also check microalbumin creatinine ratio as not done at most recent visit with my team   #hyperlipidemia S: Medication: Simvastatin  20 mg  Lab Results  Component Value Date   CHOL 161 11/15/2023   HDL 37.90 (L) 11/15/2023   LDLCALC 98 11/15/2023   LDLDIRECT 107.0 11/15/2022   TRIG 124.0 11/15/2023   CHOLHDL 4 11/15/2023   A/P: Cholesterol is imperfect but reasonably well-controlled-continue current medication as long as LDL under 70  # Asthma S: Patient with an older albuterol  Melissa as well as  Symbicort .  She is primarily using Symbicort  as needed and has had good success with that with illnesses A/P: Reasonable control that she is essentially transition to Smart therapy-continue current medication and remove albuterol  from last  #Insomnia S: Ambien  10 mg, patient aware we need to reduce this to 5 mg at age 23 A/P: We refilled this in June and she reports good control-continue current medication though eventually will need to reduce the dose   Recommended follow up: Return for next already scheduled visit or sooner if needed. Future Appointments  Date Time Provider Department Center  11/16/2024  9:00 AM Katrinka Garnette KIDD, MD [facility]    Lab/Order associations:   ICD-10-CM   1. Fullness of supraclavicular fossa  R22.2     2. Type II diabetes mellitus, well controlled (HCC)  E11.9 Microalbumin / creatinine urine ratio    3. Hyperlipidemia associated with type 2 diabetes mellitus (HCC)  E11.69    E78.5     4. Moderate persistent asthma without complication  J45.40     5. Insomnia, unspecified type  G47.00       No orders of the defined types were placed in this encounter.   Return precautions advised.  Garnette Lukes, MD

## 2023-12-30 NOTE — Patient Instructions (Addendum)
  We discussed doing a scan of your neck again but reassuring scan from 2016. You wanted after discussion to hold off but you can reach out anytime if you change your mind  Team [facility]   Recommended follow up: Return for next already scheduled visit or sooner if needed.

## 2023-12-31 ENCOUNTER — Ambulatory Visit: Payer: Self-pay | Admitting: Family Medicine

## 2023-12-31 LAB — MICROALBUMIN / CREATININE URINE RATIO
Creatinine,U: 134.4 mg/dL
Microalb Creat Ratio: 10.8 mg/g (ref 0.0–30.0)
Microalb, Ur: 1.4 mg/dL (ref 0.0–1.9)

## 2024-02-20 ENCOUNTER — Other Ambulatory Visit: Payer: Self-pay | Admitting: Internal Medicine

## 2024-04-02 ENCOUNTER — Ambulatory Visit: Payer: Self-pay

## 2024-04-02 NOTE — Telephone Encounter (Signed)
 Patient scheduled to see Corean Comment 11/14. Dr. Katrinka will review triage notes.

## 2024-04-02 NOTE — Telephone Encounter (Signed)
 FYI Only or Action Required?: FYI only for provider: appointment scheduled on 04/03/24.  Patient was last seen in primary care on 12/30/2023 by Katrinka Garnette KIDD, MD.  Called Nurse Triage reporting Foot Swelling.  Symptoms began several days ago.  Interventions attempted: OTC medications: Tylenol and Rest, hydration, or home remedies.  Symptoms are: unchanged.  Triage Disposition: See Physician Within 24 Hours  Patient/caregiver understands and will follow disposition?: Yes   Copied from CRM #[id number]. Topic: Clinical - Red Word Triage >> Apr 02, 2024 10:39 AM Alfonso HERO wrote: Red Word that prompted transfer to Nurse Triage: right foot pain, burning sensation, swollen gets worse at night Reason for Disposition  [1] Swollen foot AND [2] no fever  (Exceptions: Localized bump from bunions, calluses, insect bite, sting.)  Answer Assessment - Initial Assessment Questions 1. ONSET: When did the pain start?      Friday  2. LOCATION: Where is the pain located?      Right foot  3. PAIN: How bad is the pain?    (Scale 1-10; or mild, moderate, severe)     Burning sensation, pain worse when standing  4. WORK OR EXERCISE: Has there been any recent work or exercise that involved this part of the body?      no 5. CAUSE: What do you think is causing the foot pain?     Unsure if she bumped it or not  6. OTHER SYMPTOMS: Do you have any other symptoms? (e.g., leg pain, rash, fever, numbness)     Mild puffiness 7. PREGNANCY: Is there any chance you are pregnant? When was your last menstrual period?  Protocols used: Foot Pain-A-AH

## 2024-04-03 ENCOUNTER — Ambulatory Visit: Admitting: Family

## 2024-04-03 VITALS — BP 138/82 | HR 81 | Temp 97.2°F | Ht 65.0 in | Wt 159.5 lb

## 2024-04-03 DIAGNOSIS — M79671 Pain in right foot: Secondary | ICD-10-CM

## 2024-04-03 NOTE — Progress Notes (Signed)
 Patient ID: Denise Beard, female    DOB: June 10, 1960, 63 y.o.   MRN: [id number]  Chief Complaint  Patient presents with   Foot Swelling    Pt c/o right foot pain and swelling, present for 1 week. Has tried elevating, sx worsen at night.   Discussed the use of AI scribe software for clinical note transcription with the patient, who gave verbal consent to proceed.  History of Present Illness Denise Beard is a 63 year old female who presents with a swollen and tender foot.  She has experienced swelling and tenderness in her foot for one week, with burning and throbbing pain, especially severe at night. The pain is located on the top of the foot, radiating around the back of her toes, without redness or warmth. Swelling has been constant, preventing her from wearing a shoe on the affected foot. Tingling and 'pins and needles' sensations occur in her toes, particularly when wearing a shoe. She has no previous neuropathy symptoms related to her diabetes in her feet. Her current medications include gabapentin  for burning shoulder pain, Metformin , Lantus , Ozempic  for diabetes, and also Zocor  & Ambien . She has not been regularly checking her blood sugar levels and recently removed her Libre glucose monitoring system & needing to pick up her refill. She recalls consuming fish prior to symptom onset but denies recent alcohol intake or consumption of foods typically associated with gout. She is concerned about the possibility of gout, but denies any history of previous gout episodes.  Assessment & Plan Right foot pain and swelling Tender right anterior foot from big toe along top of foot to bottom of lower leg with burning sensation. No erythema, foot is cool to touch, swelling isolated to top of foot. Differential includes gout, but lacks typical warmth and redness. High sodium intake may contribute to swelling. - Continue gabapentin  300mg  at night for pain management for next 3-4 nights. - Take two  Aleve  (naproxen ) twice a day for inflammation and pain for next 3-4 days. - Increase water intake to two liters per day. - Avoid high sodium foods, takeout, canned, or frozen dinners. - Avoid shellfish, alcohol, and meat to prevent potential gout triggers. - Keep foot elevated whenever resting. - Call office next week if pain/swelling are persisting.  Subjective:    Outpatient Medications Prior to Visit  Medication Sig Dispense Refill   azelastine  (ASTELIN ) 0.1 % nasal spray Place 2 sprays into both nostrils 2 (two) times daily. 30 mL 12   BD PEN NEEDLE NANO 2ND GEN 32G X 4 MM MISC USE ONCE DAILY AS DIRECTED 100 each 0   budesonide -formoterol  (SYMBICORT ) 160-4.5 MCG/ACT inhaler Inhale 2 puffs into the lungs 2 (two) times daily. 1 each 11   Continuous Glucose Sensor (FREESTYLE LIBRE 3 PLUS SENSOR) MISC Inject 1 Device into the skin continuous. Change every 15 days 6 each 3   Continuous Glucose Sensor (FREESTYLE LIBRE 3 PLUS SENSOR) MISC USE AS DIRECTED EVERY 14 DAYS 2 each 11   gabapentin  (NEURONTIN ) 300 MG capsule TAKE 1 CAPSULE BY MOUTH 3 TIMES DAILY AS NEEDED. 90 capsule 3   insulin  glargine (LANTUS  SOLOSTAR) 100 UNIT/ML Solostar Pen INJECT 40 UNITS INTO THE SKIN DAILY at bedtime 45 mL 3   insulin  lispro (HUMALOG  KWIKPEN) 100 UNIT/ML KwikPen INJECT 12-15 UNITS INTO THE SKIN 3 TIMES A DAY BEFORE MEALS 30 mL 3   metFORMIN  (GLUCOPHAGE ) 1000 MG tablet TAKE 2 TABLETS (2,000 MG TOTAL) BY MOUTH DAILY WITH SUPPER. 180 tablet  3   Semaglutide , 2 MG/DOSE, (OZEMPIC , 2 MG/DOSE,) 8 MG/3ML SOPN Inject 2 mg into the skin once a week. 9 mL 3   simvastatin  (ZOCOR ) 20 MG tablet TAKE 1 TABLET BY MOUTH EVERYDAY AT BEDTIME 90 tablet 3   zolpidem  (AMBIEN ) 10 MG tablet TAKE 1 TABLET BY MOUTH EVERY DAY AT BEDTIME AS NEEDED FOR SLEEP 30 tablet 5   No facility-administered medications prior to visit.   Past Medical History:  Diagnosis Date   ANXIETY 03/17/2007   in past   ASTHMA 06/26/2007   DIAB W/O COMP TYPE  II/UNS NOT STATED UNCNTRL 04/06/2009   GERD 06/26/2007   Irritable bowel syndrome 02/27/2010   constipation   OA (osteoarthritis) 12/13/2009   SEIZURE DISORDER 04/17/2010   in past /over 4 years ago/no meds. actually vasovagal per neuro notes   Past Surgical History:  Procedure Laterality Date   ABDOMINAL HYSTERECTOMY     CESAREAN SECTION     TUBAL LIGATION     Allergies  Allergen Reactions   Iodine     Per the patient. Had an oral solution in the 90's that gave her a bad reaction of hallucinations.       Objective:    Physical Exam Vitals and nursing note reviewed.  Constitutional:      Appearance: Normal appearance.  Cardiovascular:     Rate and Rhythm: Normal rate and regular rhythm.  Pulmonary:     Effort: Pulmonary effort is normal.     Breath sounds: Normal breath sounds.  Musculoskeletal:     Right foot: Decreased range of motion.       Feet:  Feet:     Right foot:     Skin integrity: No skin breakdown, erythema, warmth, callus or dry skin.     Comments: 1+ edema noted on anterior right foot, see diagram. Skin:    General: Skin is warm and dry.  Neurological:     Mental Status: She is alert.  Psychiatric:        Mood and Affect: Mood normal.        Behavior: Behavior normal.    BP 138/82   Pulse 81   Temp (!) 97.2 F (36.2 C) (Temporal)   Ht 5' 5 (1.651 m)   Wt 159 lb 8 oz (72.3 kg)   SpO2 98%   BMI 26.54 kg/m  Wt Readings from Last 3 Encounters:  04/03/24 159 lb 8 oz (72.3 kg)  12/30/23 162 lb 9.6 oz (73.8 kg)  11/15/23 163 lb (73.9 kg)      Lucius Krabbe, NP

## 2024-04-21 ENCOUNTER — Ambulatory Visit: Admitting: Family Medicine

## 2024-04-21 ENCOUNTER — Encounter: Payer: Self-pay | Admitting: Family Medicine

## 2024-04-21 VITALS — BP 122/72 | HR 83 | Temp 97.2°F | Wt 162.0 lb

## 2024-04-21 DIAGNOSIS — G4700 Insomnia, unspecified: Secondary | ICD-10-CM

## 2024-04-21 DIAGNOSIS — E1169 Type 2 diabetes mellitus with other specified complication: Secondary | ICD-10-CM

## 2024-04-21 DIAGNOSIS — E119 Type 2 diabetes mellitus without complications: Secondary | ICD-10-CM

## 2024-04-21 DIAGNOSIS — M79671 Pain in right foot: Secondary | ICD-10-CM

## 2024-04-21 LAB — POCT GLYCOSYLATED HEMOGLOBIN (HGB A1C): Hemoglobin A1C: 8.1 % — AB (ref 4.0–5.6)

## 2024-04-21 NOTE — Patient Instructions (Addendum)
 We have placed a referral for you today to [facility] Dr Joane- please call their # if you do not hear within a week or honestly even by end of this week  I do want you to see Dr. Trixie we need to try to get this a1c down- really want under 7 or even 7.5   Recommended follow up: Return for as needed for new, worsening, persistent symptoms. Otherwise keep [facility] visit

## 2024-04-21 NOTE — Progress Notes (Signed)
 Phone [phone] In person visit   Subjective:   Denise Beard is a 63 y.o. year old very pleasant female patient who presents for/with See problem oriented charting Chief Complaint  Patient presents with   Foot Issue    Right foot still hurting after being seen 4 weeks ago; pain is worse at night time; stinging, burning, throbbing, stiff, limited mobility only in the right foot; requesting mammo and pap results;     Past Medical History-  Patient Active Problem List   Diagnosis Date Noted   Type II diabetes mellitus, well controlled (HCC) 04/06/2009    Priority: High   Hyperlipidemia associated with type 2 diabetes mellitus (HCC) 02/04/2015    Priority: Medium    Insomnia 02/04/2015    Priority: Medium    Asthma 06/26/2007    Priority: Medium    Osteoarthritis 02/04/2015    Priority: Low   Vasovagal syncope 02/04/2015    Priority: Low   Allergic rhinitis 02/04/2015    Priority: Low   Irritable bowel syndrome 02/27/2010    Priority: Low   GERD 06/26/2007    Priority: Low   Melanocytic nevi of trunk 12/11/2021    Medications- reviewed and updated Current Outpatient Medications  Medication Sig Dispense Refill   azelastine  (ASTELIN ) 0.1 % nasal spray Place 2 sprays into both nostrils 2 (two) times daily. 30 mL 12   BD PEN NEEDLE NANO 2ND GEN 32G X 4 MM MISC USE ONCE DAILY AS DIRECTED 100 each 0   budesonide -formoterol  (SYMBICORT ) 160-4.5 MCG/ACT inhaler Inhale 2 puffs into the lungs 2 (two) times daily. 1 each 11   Continuous Glucose Sensor (FREESTYLE LIBRE 3 PLUS SENSOR) MISC Inject 1 Device into the skin continuous. Change every 15 days 6 each 3   Continuous Glucose Sensor (FREESTYLE LIBRE 3 PLUS SENSOR) MISC USE AS DIRECTED EVERY 14 DAYS 2 each 11   gabapentin  (NEURONTIN ) 300 MG capsule TAKE 1 CAPSULE BY MOUTH 3 TIMES DAILY AS NEEDED. 90 capsule 3   insulin  glargine (LANTUS  SOLOSTAR) 100 UNIT/ML Solostar Pen INJECT 40 UNITS INTO THE SKIN DAILY at bedtime 45 mL 3    insulin  lispro (HUMALOG  KWIKPEN) 100 UNIT/ML KwikPen INJECT 12-15 UNITS INTO THE SKIN 3 TIMES A DAY BEFORE MEALS 30 mL 3   metFORMIN  (GLUCOPHAGE ) 1000 MG tablet TAKE 2 TABLETS (2,000 MG TOTAL) BY MOUTH DAILY WITH SUPPER. 180 tablet 3   Semaglutide , 2 MG/DOSE, (OZEMPIC , 2 MG/DOSE,) 8 MG/3ML SOPN Inject 2 mg into the skin once a week. 9 mL 3   simvastatin  (ZOCOR ) 20 MG tablet TAKE 1 TABLET BY MOUTH EVERYDAY AT BEDTIME 90 tablet 3   zolpidem  (AMBIEN ) 10 MG tablet TAKE 1 TABLET BY MOUTH EVERY DAY AT BEDTIME AS NEEDED FOR SLEEP 30 tablet 5   No current facility-administered medications for this visit.     Objective:  BP 122/72 (BP Location: Left Arm, Patient Position: Sitting, Cuff Size: Normal)   Pulse 83   Temp (!) 97.2 F (36.2 C) (Temporal)   Wt 162 lb (73.5 kg)   SpO2 96%   BMI 26.96 kg/m  Gen: NAD, resting comfortably CV: RRR no murmurs rubs or gallops Lungs: CTAB no crackles, wheeze, rhonchi Ext: no edema at the ankle or at the calves-right foot mildly puffy compared to the left MSK: Patient significantly tender to palpation reticulate MTPs on the right foot but also at DIP and PIP joints and with pain into the midfoot with palpation of joints.  Some mild tenderness around the  malleolus lateral and medial Skin: warm, dry , No warmth or tenderness    Assessment and Plan   # Health maintenance-requesting mammogram and Pap smear  # Right foot pain S: Patient was seen 04/03/2024 by Corean Mantis, NP.  At that time described swelling and tenderness in her foot for a week with a burning and throbbing pain particularly severe at night on the top of her foot rating to her toes without redness or warmth.  So significant was challenging to even wear shoe. She did consume fish prior to symptom onset but no alcohol or other typical gout flaring foods  They thought gout was less likely due to no redness or warmth.  She was recommended to take gabapentin  300 mg at night and Aleve  twice a day  for inflammation for 3 to 4 days as well as increase fluid intake and avoid high sodium foods in case that was contributing to swelling  Today she reports she trialed the above without relief. Burning throbbing starting at the right great toe then moves up the foot and across towards the lateral malleolus but also affects the medial malleolus- with time seems to be moving up the leg. No issues in the left foot. Worse at night still- up to 8/10 burning. No back pain with this. Gets tingling as well as some numbness. Soreness noted going up to shin but laterally on both sides but that's not the primary point of pain. Also picks up in morning around 5 am . Mild improvement in pain but nights and mornings still really bad A/P: Patient with multiple joint arthralgia (all MTP, distal interphalangeal, proximal interphalangeal joints as well as mid foot pain) as well as some edema with significant enough pain to wake her up from sleep at night.  Attempted to get x-ray today but our x-ray technician and I left the building.  We opted to do a referral to sports medicine- it has improved some but I do not suspect gout with diffuse pain. Do not suspect diabetes neuropathy with unilateral pain. No pain back but considered radicular cause on one hand but then again joints seemed so tender. No reported morning stiffness so doubt autoimmune issue.  -Has seen Dr. Margart in the past we will refer back    % Diabetes-managed by Dr. Trixie RAMAN: Medication: Ozempic  2 mg weekly, Lantus  40 , insulin  lispro sliding scale , metformin  1000 mg twice daily  Lab Results  Component Value Date   HGBA1C 8.1 (A) 04/21/2024   HGBA1C 8.6 (H) 11/15/2023   HGBA1C 9.2 (A) 07/18/2023  A/P: diabetes improving but still not at goal/poor control- encouraged follow up with Dr. Trixie - continue current medications for now    #hyperlipidemia S: Medication: Simvastatin  20 mg   Lab Results  Component Value Date   CHOL 161 11/15/2023   HDL  37.90 (L) 11/15/2023   LDLCALC 98 11/15/2023   LDLDIRECT 107.0 11/15/2022   TRIG 124.0 11/15/2023   CHOLHDL 4 11/15/2023   A/P: reasonable control-continue current medications   #Insomnia S: Ambien  10 mg, patient aware we need to reduce this to 5 mg at age 61. Was sleeping well until this foot issue A/P: reasonable control when not having acute issues    Recommended follow up: Return for as needed for new, worsening, persistent symptoms. Future Appointments  Date Time Provider Department Center  11/16/2024  9:00 AM Katrinka Garnette KIDD, MD [facility]    Lab/Order associations:   ICD-10-CM   1. Type  II diabetes mellitus, well controlled (HCC)  E11.9 POCT HgB A1C    2. Right foot pain  M79.671 Ambulatory referral to Sports Medicine    CANCELED: Ambulatory referral to Sports Medicine    3. Hyperlipidemia associated with type 2 diabetes mellitus (HCC)  E11.69    E78.5     4. Insomnia, unspecified type  G47.00       No orders of the defined types were placed in this encounter.   Return precautions advised.  Garnette Lukes, MD

## 2024-04-28 ENCOUNTER — Other Ambulatory Visit: Payer: Self-pay

## 2024-04-28 ENCOUNTER — Encounter: Payer: Self-pay | Admitting: Family Medicine

## 2024-04-28 ENCOUNTER — Ambulatory Visit

## 2024-04-28 ENCOUNTER — Ambulatory Visit: Admitting: Family Medicine

## 2024-04-28 VITALS — BP 138/88 | HR 81 | Ht 65.0 in | Wt 160.0 lb

## 2024-04-28 DIAGNOSIS — M79671 Pain in right foot: Secondary | ICD-10-CM

## 2024-04-28 LAB — COMPREHENSIVE METABOLIC PANEL WITH GFR
ALT: 19 U/L (ref 0–35)
AST: 16 U/L (ref 0–37)
Albumin: 4.3 g/dL (ref 3.5–5.2)
Alkaline Phosphatase: 81 U/L (ref 39–117)
BUN: 5 mg/dL — ABNORMAL LOW (ref 6–23)
CO2: 27 meq/L (ref 19–32)
Calcium: 9.3 mg/dL (ref 8.4–10.5)
Chloride: 104 meq/L (ref 96–112)
Creatinine, Ser: 0.55 mg/dL (ref 0.40–1.20)
GFR: 98.59 mL/min (ref >60.00)
Glucose, Bld: 241 mg/dL — ABNORMAL HIGH (ref 70–99)
Potassium: 3.7 meq/L (ref 3.5–5.1)
Sodium: 139 meq/L (ref 135–145)
Total Bilirubin: 0.3 mg/dL (ref 0.2–1.2)
Total Protein: 7.3 g/dL (ref 6.0–8.3)

## 2024-04-28 LAB — URIC ACID: Uric Acid, Serum: 5.4 mg/dL (ref 2.4–7.0)

## 2024-04-28 NOTE — Patient Instructions (Addendum)
 Thank you for coming in today.   Please get an Xray today before you leave   Please get labs today before you leave   OK to use a post op shoe or a cam walker boot from [employer] or [facility] [place] [facility].

## 2024-04-28 NOTE — Progress Notes (Signed)
   I, Leotis Batter, CMA acting as a scribe for Artist Lloyd, MD.  Denise Beard is a 63 y.o. female who presents to [facility] at [facility] today for R foot pain. Pt was previously seen by Dr. Lloyd in 2021-22 for R shoulder pain.  Today, pt c/o R foot pain ongoing since early Nov. Pain seems to be worse at night, throbbing, burning pain. Pt locates pain to the great toe, radiating into the top of the foot and around the ankle. Swelling present intermittently. Stiffness. Sx worse at night, burning and throbbing, aching like a tooth-ache.   Swelling: present Aggravates: worse at night, WB, shoes Treatments tried: elevation, Gabapentin , Naproxen   Pertinent review of systems: no fever or chills  Relevant historical information: Diabetes type 1.5?   Exam:  BP 138/88   Pulse 81   Ht 5' 5 (1.651 m)   Wt 160 lb (72.6 kg)   SpO2 100%   BMI 26.63 kg/m  General: Well Developed, well nourished, and in no acute distress.   MSK: Right foot some swelling across dorsal forefoot near metatarsals 2 and 3.  Tender palpation first MTP and metatarsals 2 and 3. Normal foot and ankle motion intact strength.    Lab and Radiology Results  [facility] MSK Ultrasound of: Right foot Mild effusion present at first MTP. No cortical defect or swelling located superficial to metatarsal shafts 1-3 and 4. Impression: Mild effusion first MTP  X-ray images right foot obtained today personally and independently interpreted. No fractures.  No visible stress fractures.  Minimal degenerative changes first MTP. Await formal radiology review    Assessment and Plan: 63 y.o. female with right foot pain without injury.  Etiology is not certain at this time.  Gout flare is a possibility as it is stress reaction or stress fracture.  Plan for postoperative shoe and will check labs for uric acid and metabolic panel and foot x-ray.  If uric acid elevated will initiate treatment for gout  with colchicine and allopurinol.  Consider MRI if testing is nondiagnostic and she does not get better.   PDMP not reviewed this encounter. Orders Placed This Encounter  Procedures   US  LIMITED JOINT SPACE STRUCTURES LOW RIGHT(NO LINKED CHARGES)    Reason for Exam (SYMPTOM  OR DIAGNOSIS REQUIRED):   right foot pain    Preferred imaging location?:   [facility] Foot Complete Right    Standing Status:   Future    Number of Occurrences:   1    Expiration Date:   04/28/2025    Reason for Exam (SYMPTOM  OR DIAGNOSIS REQUIRED):   right foot pain    Preferred imaging location?:    [facility]   Comprehensive metabolic panel with GFR    Standing Status:   Future    Expiration Date:   04/28/2025   Uric acid    Standing Status:   Future    Expiration Date:   04/28/2025   No orders of the defined types were placed in this encounter.    Discussed warning signs or symptoms. Please see discharge instructions. Patient expresses understanding.   The above documentation has been reviewed and is accurate and complete Artist Lloyd, M.D.

## 2024-04-29 ENCOUNTER — Ambulatory Visit: Payer: Self-pay | Admitting: Family Medicine

## 2024-04-29 NOTE — Progress Notes (Signed)
 Blood sugar is elevated.  Kidney function and calcium look okay.  Uric acid looks okay.  Gout is less likely.  If your foot does not improve next step would be an MRI.

## 2024-05-04 ENCOUNTER — Other Ambulatory Visit: Payer: Self-pay | Admitting: Family Medicine

## 2024-05-04 DIAGNOSIS — Z0000 Encounter for general adult medical examination without abnormal findings: Secondary | ICD-10-CM

## 2024-05-04 NOTE — Progress Notes (Signed)
Right foot x-ray looks okay to radiology.

## 2024-05-20 ENCOUNTER — Other Ambulatory Visit: Payer: Self-pay | Admitting: Internal Medicine

## 2024-05-22 ENCOUNTER — Other Ambulatory Visit: Payer: Self-pay

## 2024-05-22 NOTE — Telephone Encounter (Signed)
 Patient is scheduled for 06/12/2024 with Dr. Trixie.

## 2024-06-12 ENCOUNTER — Ambulatory Visit: Admitting: Internal Medicine

## 2024-06-12 ENCOUNTER — Encounter: Payer: Self-pay | Admitting: Internal Medicine

## 2024-06-12 VITALS — BP 120/70 | HR 86 | Ht 65.0 in | Wt 162.0 lb

## 2024-06-12 DIAGNOSIS — Z7984 Long term (current) use of oral hypoglycemic drugs: Secondary | ICD-10-CM

## 2024-06-12 DIAGNOSIS — E785 Hyperlipidemia, unspecified: Secondary | ICD-10-CM | POA: Diagnosis not present

## 2024-06-12 DIAGNOSIS — E663 Overweight: Secondary | ICD-10-CM

## 2024-06-12 DIAGNOSIS — Z7985 Long-term (current) use of injectable non-insulin antidiabetic drugs: Secondary | ICD-10-CM | POA: Diagnosis not present

## 2024-06-12 DIAGNOSIS — E1165 Type 2 diabetes mellitus with hyperglycemia: Secondary | ICD-10-CM | POA: Diagnosis not present

## 2024-06-12 DIAGNOSIS — Z6826 Body mass index (BMI) 26.0-26.9, adult: Secondary | ICD-10-CM

## 2024-06-12 DIAGNOSIS — Z794 Long term (current) use of insulin: Secondary | ICD-10-CM | POA: Diagnosis not present

## 2024-06-12 DIAGNOSIS — E119 Type 2 diabetes mellitus without complications: Secondary | ICD-10-CM

## 2024-06-12 LAB — POCT GLYCOSYLATED HEMOGLOBIN (HGB A1C): Hemoglobin A1C: 8 % — AB (ref 4.0–5.6)

## 2024-06-12 MED ORDER — FREESTYLE LIBRE 3 PLUS SENSOR MISC: 1.0000 | 3 refills | Status: AC

## 2024-06-12 MED ORDER — INSULIN LISPRO (1 UNIT DIAL) 100 UNIT/ML (KWIKPEN): PEN_INJECTOR | SUBCUTANEOUS | 3 refills | Status: AC

## 2024-06-12 NOTE — Patient Instructions (Addendum)
 Please continue: - Metformin  2000 mg with dinner - Ozempic  2 mg weekly - Lantus  40 units with dinner   Use: - Humalog  14-16 units 15 minutes before the 3 meals  Start back on the CGM.  Please return in 3 months.

## 2024-06-12 NOTE — Addendum Note (Signed)
 Addended by: CLEOTILDE ROLIN RAMAN on: 06/12/2024 04:02 PM   Modules accepted: Orders

## 2024-06-12 NOTE — Progress Notes (Addendum)
 Subjective:     Patient ID: Denise Beard, female   DOB: [birth year], 62 y.o.   MRN: [id number]  Diabetes  Ms. Lafortune is a pleasant 64 y.o. woman, returning for f/u for DM2, dx 2012, insulin -dependent, uncontrolled, without long-term complications. Last visit 11 months ago.  Interim history: No increased urination, nausea, blurry vision, CP.  She had constipation from Ozempic  but not very bothersome. She is now exercising. She has R foot pain >> sees Sports medicine.  Gout was suspected but not confirmed.  Pain improved. At today's visit, she mentions she is doing a religious fast.  She has not eaten today (3 PM).  Point-of-care CBG was 64.  Patient was given fast carbs.  Reviewed HbA1c levels: Lab Results  Component Value Date   HGBA1C 8.1 (A) 04/21/2024   HGBA1C 8.6 (H) 11/15/2023   HGBA1C 9.2 (A) 07/18/2023   HGBA1C 8.1 01/11/2023   HGBA1C 7.1 (A) 07/13/2022   HGBA1C 7.7 (A) 03/09/2022   HGBA1C 7.5 (H) 11/13/2021   HGBA1C 7.0 (A) 07/14/2021   HGBA1C 7.6 (A) 03/10/2021   HGBA1C 7.8 (A) 09/09/2020   HGBA1C 10.2 (A) 02/26/2020   HGBA1C 10.2 (A) 08/28/2019   HGBA1C 7.3 (A) 04/23/2019   HGBA1C 7.6 (H) 10/17/2018   HGBA1C 6.8 (A) 05/02/2018   HGBA1C 7.6 (A) 01/24/2018   HGBA1C 6.5 09/20/2017   HGBA1C 7.0 (H) 05/24/2017   HGBA1C 6.9 05/17/2017   HGBA1C 7.0 11/30/2016   HGBA1C 7.3 08/10/2016   HGBA1C 7.3 (H) 04/06/2016   HGBA1C 6.9 12/16/2015   HGBA1C 7.2 09/16/2015   HGBA1C 7.0 04/22/2015   HGBA1C 7.0 01/14/2015   HGBA1C 7.6 (H) 10/15/2014   HGBA1C 8.5 (H) 07/08/2014   HGBA1C 6.7 (H) 02/26/2014   HGBA1C 7.0 (H) 10/30/2013  01/11/2023: HbA1c 8.1%  At last visit she was on: - Metformin  1000 mg 2x a day with meals >> 2000 mg with dinner -  Ozempic  1 mg weekly - Lantus  30 >> 40 >> 35 >> 40 >> 35 units after dinner - missing doses 50% of the time as she tries to take this at 10 PM but she may fall asleep - Humalog  8-12 >> using 12 units 15 minutes before meals We stopped  glipizide  due to dizziness. She was on saxagliptin /metformin  XR 09/998 mg (Kombyglyze) in the past. She was on JanuMet  50/1000 bid in 2015 >> lightheaded, HA, blurry vision.  At last visit I recommended the following regimen: - Metformin  2000 mg with dinner - Humalog  12-15 units 15 minutes before the 3 meals >> 16 units 2-3 x a day (may miss dinner) - Lantus  40 units with dinner  - Ozempic  2 mg weekly  She is checking sugars >4x a day with a CGM (Libre) but off x 2 mo: - am:  89-142, 272 >> 80s, 94-174 >> 190s >> 201-281 >> 132-152 - 2h after b'fast:   111-262, 382 >> 148-228, 244 >> n/c - before lunch:  99-321 >> 93-198 >> 63 >> 106-143 >> n/c >> 90 - 2h after lunch:  84-182, 232 >> 77 >> n/c >> 281-400 >> n/c - before dinner:  n/c >> 80-109, 184 >> 103 >> n/c - 2h after dinner: 160-190 >> 227, 259 >> 89-114 >> n/c - bedtime:  n/c >> 216-381 >> 199  >> n/c >> 98 >> n/c - nighttime: 109-188 >> n/c >> 125-170 >> n/c Lowest: 55 >> .SABRA. 100 >> 60s >> 90. Highest: 500s >> .SABRASABRA 400 >> 300s (Holidays).  Meter:  One Touch ultra 2  She saw nutrition in the past.  No CKD; latest BUN/creatinine: Lab Results  Component Value Date   BUN 5 (L) 04/28/2024   CREATININE 0.55 04/28/2024   No MAU: Lab Results  Component Value Date   MICRALBCREAT 10.8 12/30/2023   MICRALBCREAT 8 05/27/2018   MICRALBCREAT 18.2 07/17/2013   + HL; lipids:  Lab Results  Component Value Date   CHOL 161 11/15/2023   HDL 37.90 (L) 11/15/2023   LDLCALC 98 11/15/2023   LDLDIRECT 107.0 11/15/2022   TRIG 124.0 11/15/2023   CHOLHDL 4 11/15/2023  On Zocor  20.  - Last eye exam 08/2023: No DR  (Dr Thom Beard - [facility]).    - No numbness or tingling in her feet.  Last foot exam: 10/2022.  Since last visit, she had a thyroid  ultrasound (12/18/2023): Parenchymal Echotexture: Mildly heterogenous  Isthmus: 0.4 cm  Right lobe: 4.6 x 1.0 x 1.7 cm  Left lobe: 4.2 x 1.5 x 1.4 cm   _________________________________________________________   Estimated total number of nodules >/= 1 cm: 1 _________________________________________________________   Nodule # 1: Subcentimeter nodule in the thyroid  isthmus. No further follow-up.   Nodule # 2: Hypoechoic solid nodule in the right mid gland measures 1.5 x 0.8 x 1.4 cm. Findings are consistent with TI-RADS category 4. **Given size (>/= 1.5 cm) and appearance, fine needle aspiration of this moderately suspicious nodule should be considered based on TI-RADS criteria.   IMPRESSION: Nodule # 2 is a 1.5 cm TI-RADS category 4 nodule in the right mid gland and meets criteria to consider fine-needle aspiration biopsy. Biopsy is recommended.  FNA (12/25/2023): Benign follicular nodule  She also has a history of anxiety, asthma, GERD, constipation; polyarthropathy; seizure disorder.   Review of Systems + See HPI  I reviewed pt's medications, allergies, PMH, social hx, family hx, and changes were documented in the history of present illness. Otherwise, unchanged from my initial visit note.  Past Medical History:  Diagnosis Date   ANXIETY 03/17/2007   in past   ASTHMA 06/26/2007   DIAB W/O COMP TYPE II/UNS NOT STATED UNCNTRL 04/06/2009   GERD 06/26/2007   Irritable bowel syndrome 02/27/2010   constipation   OA (osteoarthritis) 12/13/2009   SEIZURE DISORDER 04/17/2010   in past /over 4 years ago/no meds. actually vasovagal per neuro notes   Past Surgical History:  Procedure Laterality Date   ABDOMINAL HYSTERECTOMY     CESAREAN SECTION     TUBAL LIGATION     Social History   Socioeconomic History   Marital status: Divorced    Spouse name: Not on file   Number of children: Not on file   Years of education: Not on file   Highest education level: Not on file  Occupational History   Not on file  Tobacco Use   Smoking status: Never   Smokeless tobacco: Never  Substance and Sexual Activity   Alcohol use: No   Drug  use: No   Sexual activity: Not Currently    Birth control/protection: Surgical  Other Topics Concern   Not on file  Social History Narrative   Divorced/Single. 2 chldren. 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 [place]      Hobbies: church very active      Social Drivers of Health   Tobacco Use: Low  Risk (04/28/2024)   Patient History    Smoking Tobacco Use: Never    Smokeless Tobacco Use: Never    Passive Exposure: Not on file  Financial Resource Strain: Patient Declined (12/30/2023)   Overall Financial Resource Strain (CARDIA)    Difficulty of Paying Living Expenses: Patient declined  Food Insecurity: Patient Declined (12/30/2023)   Epic    Worried About Programme Researcher, Broadcasting/film/video in the Last Year: Patient declined    Barista in the Last Year: Patient declined  Transportation Needs: Patient Declined (12/30/2023)   Epic    Lack of Transportation (Medical): Patient declined    Lack of Transportation (Non-Medical): Patient declined  Physical Activity: Unknown (12/30/2023)   Exercise Vital Sign    Days of Exercise per Week: Patient declined    Minutes of Exercise per Session: Not on file  Stress: Patient Declined (12/30/2023)   [employer] of Occupational Health - Occupational Stress Questionnaire    Feeling of Stress: Patient declined  Social Connections: Unknown (12/30/2023)   Social Connection and Isolation Panel    Frequency of Communication with Friends and Family: Patient declined    Frequency of Social Gatherings with Friends and Family: Patient declined    Attends Religious Services: Patient declined    Active Member of Clubs or Organizations: Patient declined    Attends Banker Meetings: Not on file    Marital Status: Patient declined  Intimate Partner Violence: Not on file  Depression (PHQ2-9): Low Risk (04/21/2024)   Depression  (PHQ2-9)    PHQ-2 Score: 2  Alcohol Screen: Not on file  Housing: Patient Declined (12/30/2023)   Epic    Unable to Pay for Housing in the Last Year: Patient declined    Number of Times Moved in the Last Year: Not on file    Homeless in the Last Year: Patient declined  Utilities: Not on file  Health Literacy: Not on file   Current Outpatient Medications on File Prior to Visit  Medication Sig Dispense Refill   azelastine  (ASTELIN ) 0.1 % nasal spray Place 2 sprays into both nostrils 2 (two) times daily. 30 mL 12   BD PEN NEEDLE NANO 2ND GEN 32G X 4 MM MISC USE ONCE DAILY AS DIRECTED 100 each 0   budesonide -formoterol  (SYMBICORT ) 160-4.5 MCG/ACT inhaler Inhale 2 puffs into the lungs 2 (two) times daily. 1 each 11   Continuous Glucose Sensor (FREESTYLE LIBRE 3 PLUS SENSOR) MISC Inject 1 Device into the skin continuous. Change every 15 days 6 each 3   Continuous Glucose Sensor (FREESTYLE LIBRE 3 PLUS SENSOR) MISC USE AS DIRECTED EVERY 14 DAYS 2 each 11   gabapentin  (NEURONTIN ) 300 MG capsule TAKE 1 CAPSULE BY MOUTH 3 TIMES DAILY AS NEEDED. 90 capsule 3   insulin  glargine (LANTUS  SOLOSTAR) 100 UNIT/ML Solostar Pen INJECT 40 UNITS INTO THE SKIN DAILY at bedtime 45 mL 3   insulin  lispro (HUMALOG  KWIKPEN) 100 UNIT/ML KwikPen INJECT 12-15 UNITS INTO THE SKIN 3 TIMES A DAY BEFORE MEALS 30 mL 3   metFORMIN  (GLUCOPHAGE ) 1000 MG tablet TAKE 2 TABLETS (2,000 MG TOTAL) BY MOUTH DAILY WITH SUPPER. 180 tablet 3   Semaglutide , 2 MG/DOSE, (OZEMPIC , 2 MG/DOSE,) 8 MG/3ML SOPN Inject 2 mg into the skin once a week. 9 mL 3   simvastatin  (ZOCOR ) 20 MG tablet TAKE 1 TABLET BY MOUTH EVERYDAY AT BEDTIME 90 tablet 3   zolpidem  (AMBIEN ) 10 MG tablet TAKE 1 TABLET BY MOUTH AT BEDTIME AS NEEDED FOR SLEEP  30 tablet 5   No current facility-administered medications on file prior to visit.   Allergies  Allergen Reactions   Iodine     Per the patient. Had an oral solution in the 90's that gave her a bad reaction of  hallucinations.    Family History  Problem Relation Age of Onset   Thyroid  disease Mother    Hyperlipidemia Mother    Breast cancer Mother 60   Arthritis Mother    Cancer Mother    Hypertension Mother    Skin cancer Father        not melanoma- passed at 50   Colon cancer Maternal Uncle    Stomach cancer Maternal Uncle    Lung cancer Maternal Uncle    Cancer Maternal Grandfather        unknown   Diabetes Maternal Grandfather     Objective:   Physical Exam  BP 120/70   Pulse 86   Ht 5' 5 (1.651 m)   Wt 162 lb (73.5 kg)   SpO2 99%   BMI 26.96 kg/m    Wt Readings from Last 20 Encounters:  06/12/24 162 lb (73.5 kg)  04/28/24 160 lb (72.6 kg)  04/21/24 162 lb (73.5 kg)  04/03/24 159 lb 8 oz (72.3 kg)  12/30/23 162 lb 9.6 oz (73.8 kg)  11/15/23 163 lb (73.9 kg)  07/18/23 155 lb 9.6 oz (70.6 kg)  07/05/23 155 lb 9.6 oz (70.6 kg)  01/14/23 159 lb (72.1 kg)  01/11/23 158 lb (71.7 kg)  12/11/22 159 lb (72.1 kg)  11/15/22 155 lb 9.6 oz (70.6 kg)  07/13/22 155 lb 3.2 oz (70.4 kg)  06/22/22 152 lb 9.6 oz (69.2 kg)  05/30/22 160 lb 2 oz (72.6 kg)  03/09/22 164 lb 9.6 oz (74.7 kg)  12/11/21 163 lb (73.9 kg)  12/01/21 160 lb 6.4 oz (72.8 kg)  11/13/21 162 lb 9.6 oz (73.8 kg)  07/14/21 159 lb 3.2 oz (72.2 kg)   Constitutional: overweight, in NAD Eyes:  EOMI, no exophthalmos ENT: no neck masses, no cervical lymphadenopathy Cardiovascular: RRR, No MRG, + full supraclavicular fat pads Respiratory: CTA B Musculoskeletal: no deformities Skin:no rashes Neurological: no tremor with outstretched hands Diabetic Foot Exam - Simple   Simple Foot Form Diabetic Foot exam was performed with the following findings: Yes 06/12/2024  2:53 PM  Visual Inspection No deformities, no ulcerations, no other skin breakdown bilaterally: Yes Sensation Testing Intact to touch and monofilament testing bilaterally: Yes Pulse Check Posterior Tibialis and Dorsalis pulse intact bilaterally:  Yes Comments    Assessment:     1. DM2, insulin -dependent, uncontrolled, without long term complications, but with hyperglycemia  Type 1 diabetes investigation was negative: Component     Latest Ref Rng & Units 01/24/2018  ZNT8 Antibodies     U/mL <15  Islet Cell Ab     Neg:<1:1 Negative  Glutamic Acid Decarb Ab     <5 IU/mL <5  C-Peptide     0.80 - 3.85 ng/mL 2.07  Glucose, Plasma     65 - 99 mg/dL 865 (H)   2. HL  3.  Right thyroid  nodule    Plan:     Pt with longstanding, uncontrolled, type 2 diabetes, returning after a long absence of almost a year.  When I last saw her, her HbA1c was 9.2%, quite high.  She had another HbA1c obtained by PCP last month and this was better, but still above target, at 8.1%. - At our last visit,  sugars were very high, as she mentioned that she was missing ~50% of the Lantus  doses.  We discussed about the importance of taking it daily.  We also reviewed correct injection techniques as she was not doing the injections correctly.  I advised her to rotate the injection sites more since she was injecting only on a small site on her abdomen.  I also advised her to increase the dose of Lantus  and to use a slightly higher dose of Humalog  before larger meals.  I recommended to increase the Ozempic  dose, also. -At today's visit she mentions that she was able to start on the CGM since last visit, but with a co-pay.  She does not have it on for the last 2 months but plans to restart.  I strongly advised her to do so, especially as her sugars today in clinic were low-64.  She was given juice.  Glucose increased to 109 and patient was allowed to leave the clinic.  I did advise her that for the period of the fast to decrease the dose of her Lantus  to only 30 units.  She will stop the fast in 2 days but may do this occasionally in the future. -For now, we do not have a lot of blood sugars checked at home to understand trends.  She is checking in the morning and they  appear to be lower than last time, with some values above target, up to 150.  We discussed about continuing the same regimen for now, but vary the dose of Humalog  with her meals more. - I advised her to: Patient Instructions  Please continue: - Metformin  2000 mg with dinner - Ozempic  2 mg weekly - Lantus  40 units with dinner   Use: - Humalog  14-16 units 15 minutes before the 3 meals  Start back on the CGM.  Please return in 3 months.  - we checked her HbA1c: 8% (slightly lower) - advised to check sugars at different times of the day - 4x a day, rotating check times - advised for yearly eye exams >> she is UTD - return to clinic in 3 months  2. HL  Latest lipid panel was reviewed from 10/2023: LDL above target, HDL low: Lab Results  Component Value Date   CHOL 161 11/15/2023   HDL 37.90 (L) 11/15/2023   LDLCALC 98 11/15/2023   LDLDIRECT 107.0 11/15/2022   TRIG 124.0 11/15/2023   CHOLHDL 4 11/15/2023  -She continues on Zocor  20 mg daily without side effects.  3.  Right thyroid  nodule  - Diagnosed since last visit on thyroid  ultrasound - Recent biopsy was benign 12/25/2023 - No neck compression symptoms - We will continue to follow her clinically  Lela Fendt, MD PhD [facility]

## 2024-09-11 ENCOUNTER — Ambulatory Visit: Admitting: Internal Medicine

## 2024-11-16 ENCOUNTER — Encounter: Admitting: Family Medicine
